# Patient Record
Sex: Male | Born: 1985 | Race: White | Hispanic: No | Marital: Single | Smoking: Current some day smoker
Health system: Southern US, Community
[De-identification: ages and names within clinical notes are randomized; demographics above are authoritative.]

## PROBLEM LIST (undated history)

## (undated) DIAGNOSIS — J45909 Unspecified asthma, uncomplicated: Secondary | ICD-10-CM

## (undated) DIAGNOSIS — J189 Pneumonia, unspecified organism: Secondary | ICD-10-CM

## (undated) DIAGNOSIS — T07XXXA Unspecified multiple injuries, initial encounter: Secondary | ICD-10-CM

## (undated) DIAGNOSIS — S42471A Displaced transcondylar fracture of right humerus, initial encounter for closed fracture: Secondary | ICD-10-CM

## (undated) DIAGNOSIS — S61411A Laceration without foreign body of right hand, initial encounter: Secondary | ICD-10-CM

## (undated) DIAGNOSIS — F329 Major depressive disorder, single episode, unspecified: Secondary | ICD-10-CM

## (undated) DIAGNOSIS — F32A Depression, unspecified: Secondary | ICD-10-CM

## (undated) DIAGNOSIS — K219 Gastro-esophageal reflux disease without esophagitis: Secondary | ICD-10-CM

## (undated) DIAGNOSIS — Z8489 Family history of other specified conditions: Secondary | ICD-10-CM

## (undated) HISTORY — PX: TYMPANOSTOMY TUBE PLACEMENT: SHX32

---
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
## 2007-02-06 ENCOUNTER — Emergency Department (HOSPITAL_COMMUNITY): Admission: EM | Admit: 2007-02-06 | Discharge: 2007-02-06 | Payer: Self-pay | Admitting: Emergency Medicine

## 2009-09-03 ENCOUNTER — Emergency Department (HOSPITAL_BASED_OUTPATIENT_CLINIC_OR_DEPARTMENT_OTHER): Admission: EM | Admit: 2009-09-03 | Discharge: 2009-09-03 | Payer: Self-pay | Admitting: Emergency Medicine

## 2011-01-26 LAB — COMPREHENSIVE METABOLIC PANEL
ALT: 20 U/L (ref 0–53)
AST: 22 U/L (ref 0–37)
Albumin: 4.9 g/dL (ref 3.5–5.2)
Alkaline Phosphatase: 57 U/L (ref 39–117)
BUN: 15 mg/dL (ref 6–23)
CO2: 25 mEq/L (ref 19–32)
Calcium: 9.9 mg/dL (ref 8.4–10.5)
Chloride: 108 mEq/L (ref 96–112)
Creatinine, Ser: 0.8 mg/dL (ref 0.4–1.5)
GFR calc Af Amer: >60 mL/min (ref >60)
GFR calc non Af Amer: >60 mL/min (ref >60)
Glucose, Bld: 79 mg/dL (ref 70–99)
Potassium: 4.2 mEq/L (ref 3.5–5.1)
Sodium: 144 mEq/L (ref 135–145)
Total Bilirubin: 0.8 mg/dL (ref 0.3–1.2)
Total Protein: 7.8 g/dL (ref 6.0–8.3)

## 2011-01-26 LAB — DIFFERENTIAL
Basophils Absolute: 0.1 10*3/uL (ref 0.0–0.1)
Basophils Relative: 2 % — ABNORMAL HIGH (ref 0–1)
Eosinophils Absolute: 0.4 10*3/uL (ref 0.0–0.7)
Eosinophils Relative: 7 % — ABNORMAL HIGH (ref 0–5)
Lymphocytes Relative: 33 % (ref 12–46)
Lymphs Abs: 2 10*3/uL (ref 0.7–4.0)
Monocytes Absolute: 0.7 10*3/uL (ref 0.1–1.0)
Monocytes Relative: 11 % (ref 3–12)
Neutro Abs: 3 10*3/uL (ref 1.7–7.7)
Neutrophils Relative %: 47 % (ref 43–77)

## 2011-01-26 LAB — CBC
HCT: 50.3 % (ref 39.0–52.0)
Hemoglobin: 17.1 g/dL — ABNORMAL HIGH (ref 13.0–17.0)
MCHC: 34.1 g/dL (ref 30.0–36.0)
MCV: 89.5 fL (ref 78.0–100.0)
Platelets: 233 10*3/uL (ref 150–400)
RBC: 5.62 MIL/uL (ref 4.22–5.81)
RDW: 12 % (ref 11.5–15.5)
WBC: 6.2 10*3/uL (ref 4.0–10.5)

## 2011-03-11 NOTE — Discharge Summary (Signed)
[place]. [facility]  Patient:    [name] Hall, [name] Hall                      MRN: [id number] Adm. Date:  [id number] Disc. Date: [id number] Attending:  Willow Ora Dictator:   Talmage Nap, M.D.                           Discharge Summary  ADMISSION DIAGNOSES: 1. Asthma exacerbation. 2. Smoking abuse. 3. Bipolar disorder. 4. Conduct disorder.  DISCHARGE DIAGNOSES: 1. Asthma exacerbation improved. 2. Tobacco cessation counseling. 3. Bipolar disorder. 4. Conduct disorder.  DISCHARGE MEDICATIONS: 1. Claritin 10 mg q.d. 2. Azithromycin 250 mg q.d. x 3 days. 3. Advair 250/50 one puff b.i.d. 4. Effexor 75 mg q.d. 5. Depakote ER 500 mg q.h.s. 6. Albuterol MDI with spacer or nebulizer every four hours as needed. 7. Prednisone taper 50 mg x 3 days, 20 mg x 3 days, 10 mg x 3 days, 5 mg    x 3 days, then stop.  FOLLOW-UP APPOINTMENTS:  Patients mom will call for an appointment at [facility] on Monday.  The phone number is [phone].  SPECIAL INSTRUCTIONS:  Patient was instructed not to smoke.  This was discussed with his family as well.  He should call the [facility] if he is having any difficulty breathing or shortness of breath.  HOSPITAL COURSE:  #1 - ASTHMA:  [name] Hall was originally admitted on September 23, 2000 at [facility] for asthma exacerbation.  He continued to do poorly and was transferred to [facility] for worsening asthma and possible need for intubation.  He was managed without intubation on high-dosed steroids, albuterol nebulizer, smoking cessation.  He did well and was transferred back to [facility] on September 29, 2000.  During his hospital stay at [facility], he required oxygen by nasal cannula x 24 hours.  He was continued on prednisone and changed to MDIs q.4-6h. as needed.  On hospital day #2, his oxygen had improved and he was saturating 96% on room air.  He continued to  do well and on the day of discharge was ambulating in the hallways with saturations of 95% on room air.  Patient denied any significant shortness of breath.  He continued to have a mild cough.  He will be discharged home on a prednisone taper, nebulizer or MDI treatments every four hours, and a full five-day course of azithromycin.  Patient will need follow up this week at [facility] and mom will call for an appointment.  #2 - BIPOLAR DISORDER AND CONDUCT DISORDER:  Patient apparently had some problems [facility] with anger management.  He has done well during this hospital stay.  He will be continued on his Depakote and Effexor.  #3 - SMOKING CESSATION:  Patient reports now that he had been smoking about one pack every three to four days and was receiving cigarettes from his family.  He does understand the importance of stopping smoking and has agreed to stop.  He does not feel that he needs assistance with stopping smoking. His passive tobacco exposure was also discussed with the family.  DISCHARGE DISPOSITION:  [name] Hall is doing well upon discharge and reported no significant shortness of breath even with exertion.  He had a mild cough.  He was afebrile.  He was taking a regular diet without any difficulty.  He will return home and will have follow up this week with family practice.  He will be placed on a slow prednisone taper the next 12 days as well as his albuterol.  We will continue Advair and consider Singulair if he continues to have problems. DD:  10/01/00 TD:  10/01/00 Job: [id number]

## 2011-03-11 NOTE — H&P (Signed)
[place]. [facility]  Patient:    [name] Hall, [name] Hall                      MRN: [id number] Adm. Date:  [id number] Attending:  Sanjuana Letters Dictator:   Talmage Nap, M.D.                         History and Physical  ADMITTING DIAGNOSES: 1. Asthma exacerbation.  Moderate. 2. Bipolar disorder.  HISTORY OF PRESENT ILLNESS:  Coleston is a 25 year old male with history of moderate persistent asthma with approximately ten prior admissions who presented to the emergency room via EMS secondary to respiratory distress. The patient and his mom reports a one day history of URI symptoms associated with rhinorrhea and cough and increased work of breathing.  The patient has received some relief with albuterol nebulizers but has worsened overnight. Physician spoke with mom last night approximately 7 p.m. and said that the patients peak flows were 175 with his best at 275 and was getting albuterol nebulizers q.2-4h.  Mom called this a.m. revealing that peak flow was approximately 75 and was instructed to bring Jamare to the emergency room emergently.  Dwayne denies any fevers.  He has not had any significant symptoms prior to today.  He denies any nausea or vomiting.  He has been unable to eat secondary to respiratory distress.  PAST MEDICAL HISTORY: 1. Asthma, admitted approximately 10 times in the past.  No intubations. 2. Bipolar disorder, admitted approximately two years ago, seen by Dr. Lafayette Dragon.  MEDICATIONS: 1. Advair 100-50, 2 puffs b.i.d. 2. Beconase. 3. Albuterol MDI approximately 2 times per month. 4. Effexor XR 75 mg a day. 5. Depakote ER 500 mg q.d.  ALLERGIES:  No known drug allergies.  SOCIAL HISTORY:  Akshat lives with his mom in [place], [place], and will begin seeing [facility].  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  Mom smokes at home.  FAMILY HISTORY:  Positive for asthma in dad.  Positive for bipolar disorder in mom and a cousin.  PAST  SURGICAL HISTORY:  Tympanostomy tubes.  REVIEW OF SYSTEMS:  Positive for rhinorrhea and cough.  No fever.  No current p.o. intake.  Positive respiratory distress.  No nausea, vomiting, or diarrhea.  No abdominal pain.  No chest pain.  Positive shortness of breath. Positive wheezing.  No urinary symptoms.  OBJECTIVE:  VITAL SIGNS:  Temperature is 99, blood pressure 143/78, heart rate 139, respiratory rate 28, O2 saturations 93% on room air.  Weight 125 pounds.  GENERAL:  A pleasant male who is unable to talk secondary to respiratory distress.  He is working hard to breathe using accessory muscles and tachypneic.  HEENT:  Pupils, equal, round, reactive to light.  Extraocular movements intact Conjunctivae mildly injected.  TMs are clear bilaterally.  Throat is pink without erythema or exudate.  Mucous membranes slightly dry.  NECK:  Supple without lymphadenopathy or thyromegaly.  CHEST:  Tight air movement bilaterally.  There are diffuse wheezes.  No rhonchi.  Wheezes are noted on inspiration and expiration.  There is decreased air movement.  No crackles.  CARDIOVASCULAR:  Tachycardic but regular.  There is slight increase in PMI .  EXTREMITIES:  Without clubbing, cyanosis, or edema.  ABDOMEN:  Soft with positive bowel sounds, nontender, nondistended.  NEUROLOGIC:  Alert male who is oriented.  He is having some trouble talking secondary to respiratory distress.  Exam nonfocal  A chest x-ray reveals hyperaeration with no focal infiltrates.  The remainder of labs pending.  ASSESSMENT AND PLAN: 1. Asthma exacerbation.  Josh with moderate respiratory distress and increased    work of breathing.  He was given Solu-Medrol 125 IV and is currently on a    continuous albuterol nebulizer.  We will continue albuterol nebulizer with    Atrovent x 3 for the next 3 hours.  Will give Solu-Medrol 125 q.8h. until    patient improved.  Will need to adjust the patients home medicines at     discharge.  Will monitor peak flows once the patient is improved.  Will    place on continuous pulse oxygen monitor and CV monitor on the pediatric    floor. 2. Bipolar disorder.  Will continue the patients Effexor and Depakote. DD:  09/25/00 TD:  09/25/00 Job: [id number]
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
## 2011-06-28 ENCOUNTER — Emergency Department (INDEPENDENT_AMBULATORY_CARE_PROVIDER_SITE_OTHER): Payer: Self-pay

## 2011-06-28 ENCOUNTER — Encounter: Payer: Self-pay | Admitting: *Deleted

## 2011-06-28 ENCOUNTER — Emergency Department (HOSPITAL_BASED_OUTPATIENT_CLINIC_OR_DEPARTMENT_OTHER)
Admission: EM | Admit: 2011-06-28 | Discharge: 2011-06-28 | Disposition: A | Discharge status: Home / Self Care | Payer: Self-pay | Attending: Emergency Medicine | Admitting: Emergency Medicine

## 2011-06-28 DIAGNOSIS — S93409A Sprain of unspecified ligament of unspecified ankle, initial encounter: Secondary | ICD-10-CM | POA: Insufficient documentation

## 2011-06-28 DIAGNOSIS — X500XXA Overexertion from strenuous movement or load, initial encounter: Secondary | ICD-10-CM | POA: Insufficient documentation

## 2011-06-28 DIAGNOSIS — S8990XA Unspecified injury of unspecified lower leg, initial encounter: Secondary | ICD-10-CM

## 2011-06-28 DIAGNOSIS — Y9372 Activity, wrestling: Secondary | ICD-10-CM | POA: Insufficient documentation

## 2011-06-28 MED ORDER — IBUPROFEN 800 MG PO TABS: 800.0000 mg | ORAL_TABLET | Freq: Three times a day (TID) | ORAL | Status: AC

## 2011-06-28 NOTE — ED Notes (Signed)
Right ankle injured 10 days ago still having some pain states wants to make sure it isnt broken

## 2011-06-28 NOTE — ED Notes (Signed)
Right ankle inj 10 days ago. Wearing an ASO splint on arrival that he borrowed from a friend.

## 2011-06-28 NOTE — ED Provider Notes (Signed)
History     CSN: [id number] Arrival date & time: 06/28/2011  1:39 PM  Chief Complaint  Patient presents with  . Ankle Pain   HPI Comments: R ankle pain x 10 days after twisting while wrestling with a friend.  Not taking anything for pain but thought it'd be better by now.  Able to bear weight, using friend's ASO brace.  No weakness, numbness, tingling.  Pain in anterior medial ankle.    The history is provided by the patient.    Past Medical History  Diagnosis Date  . Asthma     History reviewed. No pertinent past surgical history.  No family history on file.  History  Substance Use Topics  . Smoking status: Current Everyday Smoker -- 0.5 packs/day  . Smokeless tobacco: Not on file  . Alcohol Use: Yes      Review of Systems  Constitutional: Negative for fever, activity change and appetite change.  HENT: Negative.   Respiratory: Negative for cough, chest tightness and shortness of breath.   Cardiovascular: Negative.   Gastrointestinal: Negative for nausea, vomiting and abdominal pain.  Genitourinary: Negative for dysuria.  Musculoskeletal: Positive for joint swelling and arthralgias. Negative for back pain.  Neurological: Negative for headaches.  Psychiatric/Behavioral: Negative.     Physical Exam  BP 112/72  Pulse 72  Temp(Src) 98.2 F (36.8 C) (Oral)  Resp 20  SpO2 99%  Physical Exam  Constitutional: He is oriented to person, place, and time. He appears well-developed and well-nourished. No distress.  HENT:  Head: Normocephalic and atraumatic.  Mouth/Throat: Oropharynx is clear and moist. No oropharyngeal exudate.  Eyes: Conjunctivae are normal. Pupils are equal, round, and reactive to light.  Neck: Normal range of motion.  Cardiovascular: Normal rate, regular rhythm and normal heart sounds.   Pulmonary/Chest: Effort normal and breath sounds normal. No respiratory distress.  Abdominal: Soft. There is no tenderness. There is no rebound and no guarding.    Musculoskeletal: He exhibits tenderness.       TTP R medial malleolus and talus.  +2 DP and PT pulses, ROM of ankle and toes intact.  Neurological: He is alert and oriented to person, place, and time. No cranial nerve deficit.  Skin: Skin is warm.    ED Course  Procedures  MDM Ankle sprain, able to bear weight. Air cast, pain control, RICE  Dg Ankle Complete Right  06/28/2011  *RADIOLOGY REPORT*  Clinical Data: Twisting injury 10 days ago.  RIGHT ANKLE - COMPLETE 3+ VIEW  Comparison: None.  Findings: Soft tissue prominence malleolar region without underlying fracture or dislocation.  IMPRESSION: No fracture or dislocation.  Soft tissue prominence malleolar region.  Original Report Authenticated By: Fuller Canada, M.D.          Glynn Octave, MD 06/28/11 [id number]

## 2011-09-12 ENCOUNTER — Encounter (HOSPITAL_COMMUNITY): Payer: Self-pay

## 2011-09-12 ENCOUNTER — Emergency Department (HOSPITAL_COMMUNITY)
Admission: EM | Admit: 2011-09-12 | Discharge: 2011-09-12 | Disposition: A | Discharge status: Home / Self Care | Payer: Self-pay | Attending: Emergency Medicine | Admitting: Emergency Medicine

## 2011-09-12 DIAGNOSIS — R0789 Other chest pain: Secondary | ICD-10-CM | POA: Insufficient documentation

## 2011-09-12 DIAGNOSIS — J45901 Unspecified asthma with (acute) exacerbation: Secondary | ICD-10-CM | POA: Insufficient documentation

## 2011-09-12 DIAGNOSIS — J3489 Other specified disorders of nose and nasal sinuses: Secondary | ICD-10-CM | POA: Insufficient documentation

## 2011-09-12 DIAGNOSIS — R059 Cough, unspecified: Secondary | ICD-10-CM | POA: Insufficient documentation

## 2011-09-12 DIAGNOSIS — R0602 Shortness of breath: Secondary | ICD-10-CM | POA: Insufficient documentation

## 2011-09-12 DIAGNOSIS — R070 Pain in throat: Secondary | ICD-10-CM | POA: Insufficient documentation

## 2011-09-12 DIAGNOSIS — F17200 Nicotine dependence, unspecified, uncomplicated: Secondary | ICD-10-CM | POA: Insufficient documentation

## 2011-09-12 DIAGNOSIS — R05 Cough: Secondary | ICD-10-CM | POA: Insufficient documentation

## 2011-09-12 MED ORDER — IPRATROPIUM BROMIDE 0.02 % IN SOLN
0.5000 mg | Freq: Once | RESPIRATORY_TRACT | Status: AC
  Administered 2011-09-12: 0.5 mg via RESPIRATORY_TRACT
  Filled 2011-09-12: qty 2.5

## 2011-09-12 MED ORDER — PREDNISONE 10 MG PO TABS: 20.0000 mg | ORAL_TABLET | Freq: Every day | ORAL | Status: DC

## 2011-09-12 MED ORDER — ALBUTEROL SULFATE HFA 108 (90 BASE) MCG/ACT IN AERS: 2.0000 | INHALATION_SPRAY | RESPIRATORY_TRACT | Status: DC | PRN

## 2011-09-12 MED ORDER — ALBUTEROL SULFATE (5 MG/ML) 0.5% IN NEBU
2.5000 mg | INHALATION_SOLUTION | Freq: Once | RESPIRATORY_TRACT | Status: AC
  Administered 2011-09-12: 2.5 mg via RESPIRATORY_TRACT
  Filled 2011-09-12: qty 0.5

## 2011-09-12 MED ORDER — ALBUTEROL SULFATE HFA 108 (90 BASE) MCG/ACT IN AERS
2.0000 | INHALATION_SPRAY | Freq: Once | RESPIRATORY_TRACT | Status: AC
  Administered 2011-09-12: 2 via RESPIRATORY_TRACT
  Filled 2011-09-12: qty 6.7

## 2011-09-12 MED ORDER — PREDNISONE 20 MG PO TABS
60.0000 mg | ORAL_TABLET | Freq: Once | ORAL | Status: AC
  Administered 2011-09-12: 60 mg via ORAL
  Filled 2011-09-12: qty 3

## 2011-09-12 NOTE — ED Provider Notes (Signed)
History     CSN: [id number] Arrival date & time: 09/12/2011 11:36 AM   First MD Initiated Contact with Patient 09/12/11 1150      Chief Complaint  Patient presents with  . URI    hx of asthma    (Consider location/radiation/quality/duration/timing/severity/associated sxs/prior treatment) HPI Comments: Patient here with complaints of wheezing and shortness of breath since yesterday - he states that he is out of his inhaler - reports rhinorrhea like allergy type symptoms for the past several days as well - states that he is a smoker.  Denies productive cough, fever or chills.  Patient is a 25 y.o. male presenting with wheezing. The history is provided by the patient. No language interpreter was used.  Wheezing  The current episode started yesterday. The onset was gradual. The problem occurs frequently. The problem has been unchanged. The problem is moderate. The symptoms are relieved by nothing. The symptoms are aggravated by nothing. Associated symptoms include chest pressure, rhinorrhea, sore throat, cough, shortness of breath and wheezing. Pertinent negatives include no chest pain and no fever. There was no intake of a foreign body. He was not exposed to toxic fumes. He has not inhaled smoke recently. He has had no prior steroid use. He has had no prior hospitalizations. He has had no prior ICU admissions. He has had no prior intubations. His past medical history is significant for asthma. He has been behaving normally. Urine output has been normal. The last void occurred less than 6 hours ago. There were no sick contacts. He has received no recent medical care.    Past Medical History  Diagnosis Date  . Asthma     History reviewed. No pertinent past surgical history.  History reviewed. No pertinent family history.  History  Substance Use Topics  . Smoking status: Current Everyday Smoker -- 0.5 packs/day  . Smokeless tobacco: Not on file  . Alcohol Use: Yes      Review of  Systems  Constitutional: Negative for fever.  HENT: Positive for sore throat and rhinorrhea.   Respiratory: Positive for cough, shortness of breath and wheezing.   Cardiovascular: Negative for chest pain.  All other systems reviewed and are negative.    Allergies  Uncoded nonscreenable allergen  Home Medications   Current Outpatient Rx  Name Route Sig Dispense Refill  . ALBUTEROL SULFATE HFA 108 (90 BASE) MCG/ACT IN AERS Inhalation Inhale 2 puffs into the lungs every 6 (six) hours as needed. For shortness of breath       BP 103/68  Pulse 78  Temp(Src) 96.8 F (36 C) (Oral)  Resp 16  Ht 5\' 11"  (1.803 m)  Wt 165 lb (74.844 kg)  BMI 23.01 kg/m2  SpO2 96%  Physical Exam  Nursing note and vitals reviewed. Constitutional: He is oriented to person, place, and time. He appears well-developed and well-nourished.  HENT:  Head: Normocephalic and atraumatic.  Right Ear: External ear normal.  Left Ear: External ear normal.  Nose: Rhinorrhea present.  Mouth/Throat: Oropharynx is clear and moist.  Eyes: Pupils are equal, round, and reactive to light.  Neck: Normal range of motion. Neck supple.  Cardiovascular: Normal rate, regular rhythm, normal heart sounds and intact distal pulses.  Exam reveals no gallop and no friction rub.   No murmur heard. Pulmonary/Chest: Effort normal. No respiratory distress. He has wheezes in the right upper field, the right middle field, the right lower field, the left upper field and the left lower field.  Abdominal: Soft. Bowel  sounds are normal. There is no tenderness.  Musculoskeletal: Normal range of motion.  Neurological: He is alert and oriented to person, place, and time. No cranial nerve deficit.  Skin: Skin is warm and dry.  Psychiatric: He has a normal mood and affect. His behavior is normal. Judgment and thought content normal.    ED Course  Procedures (including critical care time)  Labs Reviewed - No data to display No results  found.   Asthma attack   MDM  Continues with expiratory wheezing in right upper and middle lobe after albuterol and atrovent and oral steroids - will re-treat with another treatment.      Marked improvement after second treatment - no wheezing noted at this time.  Izola Price [place], [place] 09/12/11 1515

## 2011-09-12 NOTE — ED Provider Notes (Signed)
Medical screening examination/treatment/procedure(s) were conducted as a shared visit with non-physician practitioner(s) and myself.  I personally evaluated the patient during the encounter  Doug Sou, MD 09/12/11 [phone]

## 2011-09-12 NOTE — ED Provider Notes (Signed)
Complains of wheezing and cough onset last night feels much improved since treatment in the emergency department.At 2:30 p.m. patient speaks in paragraphs no respiratory distress lungs with minimal end expiratory wheezes  Doug Sou, MD 09/12/11 1439

## 2011-09-12 NOTE — ED Notes (Signed)
Pt. Developed.  Cough and wheezing last night

## 2011-09-23 ENCOUNTER — Encounter (HOSPITAL_COMMUNITY): Payer: Self-pay | Admitting: Emergency Medicine

## 2011-09-23 ENCOUNTER — Observation Stay (HOSPITAL_COMMUNITY)
Admission: EM | Admit: 2011-09-23 | Discharge: 2011-09-23 | Disposition: A | Discharge status: Home / Self Care | Payer: Self-pay | Attending: Emergency Medicine | Admitting: Emergency Medicine

## 2011-09-23 DIAGNOSIS — J45901 Unspecified asthma with (acute) exacerbation: Principal | ICD-10-CM | POA: Insufficient documentation

## 2011-09-23 MED ORDER — ALBUTEROL SULFATE (5 MG/ML) 0.5% IN NEBU
INHALATION_SOLUTION | RESPIRATORY_TRACT | Status: AC
  Administered 2011-09-23: 5 mg via RESPIRATORY_TRACT
  Filled 2011-09-23: qty 1

## 2011-09-23 MED ORDER — ALBUTEROL SULFATE HFA 108 (90 BASE) MCG/ACT IN AERS
2.0000 | INHALATION_SPRAY | RESPIRATORY_TRACT | Status: DC | PRN
  Administered 2011-09-23: 2 via RESPIRATORY_TRACT
  Filled 2011-09-23: qty 6.7

## 2011-09-23 MED ORDER — ALBUTEROL SULFATE (2.5 MG/3ML) 0.083% IN NEBU: 2.5000 mg | INHALATION_SOLUTION | Freq: Four times a day (QID) | RESPIRATORY_TRACT | Status: DC | PRN

## 2011-09-23 MED ORDER — IPRATROPIUM BROMIDE 0.02 % IN SOLN
0.5000 mg | Freq: Once | RESPIRATORY_TRACT | Status: AC
  Administered 2011-09-23: 0.5 mg via RESPIRATORY_TRACT
  Filled 2011-09-23: qty 2.5

## 2011-09-23 MED ORDER — ALBUTEROL SULFATE (5 MG/ML) 0.5% IN NEBU
2.5000 mg | INHALATION_SOLUTION | Freq: Once | RESPIRATORY_TRACT | Status: AC
  Administered 2011-09-23: 2.5 mg via RESPIRATORY_TRACT
  Filled 2011-09-23: qty 0.5

## 2011-09-23 MED ORDER — PREDNISONE 20 MG PO TABS
60.0000 mg | ORAL_TABLET | Freq: Once | ORAL | Status: AC
  Administered 2011-09-23: 60 mg via ORAL
  Filled 2011-09-23: qty 3

## 2011-09-23 MED ORDER — IPRATROPIUM BROMIDE 0.02 % IN SOLN
RESPIRATORY_TRACT | Status: AC
  Administered 2011-09-23: 08:00:00 via RESPIRATORY_TRACT
  Filled 2011-09-23: qty 2.5

## 2011-09-23 NOTE — ED Provider Notes (Signed)
10:50 AM Reassessed patient.  No acute distress at this time.  No labored breathing.  O2 sat currently 95 on RA.  Patient reports that he is feeling less short of breath at this time.  Wheezing improved.  Diffuse mild expiratory wheezing heard on exam.  Will continue to monitor patient and continue on bronchospasm protocol. 11:34 AM Reassessed patient.  No acute distress.  No labored breathing.  O2 sat currently 97 on RA.  Patient reports that he is feeling much better.  Mild expiratory wheezing heard on the Left upper lobe anteriorly.  Peak flow improved.  Pascal Lux [facility] 09/25/11 2351

## 2011-09-23 NOTE — ED Notes (Signed)
LACY WITH RESPIRATORY AWARE OF CHANGE IN ORDER TO INHALER

## 2011-09-23 NOTE — Progress Notes (Signed)
Instructed patient on how to do a peak flow.  1st measurement 250, 2nd measurement 210, 3rd measurement 200.  Started patient on treatment.  Will do peak flow again in one hour.

## 2011-09-23 NOTE — ED Provider Notes (Signed)
History     CSN: [id number] Arrival date & time: 09/23/2011  7:58 AM   First MD Initiated Contact with Patient 09/23/11 [phone]      Chief Complaint  Patient presents with  . Asthma    (Consider location/radiation/quality/duration/timing/severity/associated sxs/prior treatment) HPI Comments: Patient reports that he began feeling short of breath and having tightness in his chest at approximately 3AM this morning.  PMH significant for asthma.  He reports that he usually uses a rescue albuterol inhaler.  However, he is out of his albuterol so did not use it this morning.  He currently does not smoke, but quit approximately 2 weeks ago.  He has been hospitalized in the past for his asthma, but not in several years.  No prior intubations.  Patient is a 25 y.o. male presenting with asthma. The history is provided by the patient.  Asthma This is a recurrent problem. The current episode started today. The problem occurs constantly. The problem has been gradually worsening. Associated symptoms include chest pain and coughing. Pertinent negatives include no abdominal pain, chills, congestion, diaphoresis, fever, headaches, nausea, neck pain, numbness, rash, vomiting or weakness. The symptoms are aggravated by nothing. He has tried nothing for the symptoms.    Past Medical History  Diagnosis Date  . Asthma     History reviewed. No pertinent past surgical history.  History reviewed. No pertinent family history.  History  Substance Use Topics  . Smoking status: Former Smoker -- 0.5 packs/day  . Smokeless tobacco: Not on file  . Alcohol Use: Yes      Review of Systems  Constitutional: Negative for fever, chills and diaphoresis.  HENT: Negative for congestion, rhinorrhea, neck pain and neck stiffness.   Respiratory: Positive for cough, chest tightness and wheezing. Negative for choking and stridor.   Cardiovascular: Positive for chest pain.  Gastrointestinal: Negative for nausea, vomiting,  abdominal pain and abdominal distention.  Skin: Negative for color change, pallor and rash.  Neurological: Negative for dizziness, syncope, weakness, light-headedness, numbness and headaches.    Allergies  Uncoded nonscreenable allergen  Home Medications   Current Outpatient Rx  Name Route Sig Dispense Refill  . ALBUTEROL SULFATE HFA 108 (90 BASE) MCG/ACT IN AERS Inhalation Inhale 2 puffs into the lungs every 6 (six) hours as needed. For shortness of breath      BP 119/83  Pulse 95  Temp(Src) 97.6 F (36.4 C) (Oral)  Resp 24  SpO2 96%  Physical Exam  Constitutional: He is oriented to person, place, and time. He appears well-developed and well-nourished. No distress.  HENT:  Head: Normocephalic and atraumatic.  Right Ear: External ear normal.  Left Ear: External ear normal.  Mouth/Throat: Oropharynx is clear and moist.  Neck: Normal range of motion. Neck supple.  Cardiovascular: Normal rate, regular rhythm and normal heart sounds.   Pulmonary/Chest: Effort normal. No accessory muscle usage. No respiratory distress. He has wheezes in the right upper field, the right middle field, the right lower field, the left upper field, the left middle field and the left lower field. He has no rhonchi. He has no rales. He exhibits no tenderness.  Abdominal: Soft. There is no tenderness.  Lymphadenopathy:    He has no cervical adenopathy.  Neurological: He is alert and oriented to person, place, and time.  Skin: Skin is warm and dry. No rash noted. He is not diaphoretic.  Psychiatric: He has a normal mood and affect.    ED Course  Procedures (including critical care time)  Labs Reviewed - No data to display No results found.   1. Asthma exacerbation    Patient given 60mg  prednisone and treated with neb albuterol/atrovent while in the Emergency Department.  Patient also put in the CDU on bronchospasm protocol and evaluated by respiratory therapy.  Patient's symptoms improved  significantly after treatment.  Therefore, patient discharged home and given an albuterol HFA while in the ED.   MDM  Patient's symptoms improved with albuterol/atrovent breathing treatment.        Pascal Lux [facility] 09/25/11 2355

## 2011-09-23 NOTE — ED Notes (Signed)
Pt waiting on rx for a home nebulizer

## 2011-09-23 NOTE — ED Notes (Signed)
Pt sts increased SOB and asthma sx since running out of his inhalor yesterday; pt sts does have rx but unable to afford getting filled; pt tachypnic and labored upon arrival; pt with decreased air movement and wheezing noted

## 2011-09-23 NOTE — Progress Notes (Signed)
Patient peak flow 1st attempt 280, 2nd attempt 270, 3rd attempt 260.  RT will continue to monitor.

## 2011-09-27 NOTE — ED Provider Notes (Signed)
Evaluation and management procedures were performed by the PA/NP under my supervision/collaboration.   Dione Booze, MD 09/27/11 [phone]

## 2011-09-27 NOTE — ED Provider Notes (Signed)
Evaluation and management procedures were performed by the PA/NP under my supervision/collaboration.   Yaneth Fairbairn, MD 09/27/11 0127 

## 2011-09-30 NOTE — Progress Notes (Signed)
Observation review completed. 

## 2011-10-28 ENCOUNTER — Encounter (HOSPITAL_COMMUNITY): Payer: Self-pay | Admitting: Emergency Medicine

## 2011-10-28 ENCOUNTER — Emergency Department (INDEPENDENT_AMBULATORY_CARE_PROVIDER_SITE_OTHER): Payer: Self-pay

## 2011-10-28 ENCOUNTER — Emergency Department (HOSPITAL_BASED_OUTPATIENT_CLINIC_OR_DEPARTMENT_OTHER)
Admission: EM | Admit: 2011-10-28 | Discharge: 2011-10-28 | Disposition: A | Discharge status: Home / Self Care | Payer: Self-pay | Attending: Emergency Medicine | Admitting: Emergency Medicine

## 2011-10-28 ENCOUNTER — Encounter (HOSPITAL_BASED_OUTPATIENT_CLINIC_OR_DEPARTMENT_OTHER): Payer: Self-pay | Admitting: *Deleted

## 2011-10-28 ENCOUNTER — Inpatient Hospital Stay (HOSPITAL_COMMUNITY)
Admission: EM | Admit: 2011-10-28 | Discharge: 2011-11-04 | DRG: 202 | Payer: Self-pay | Attending: Internal Medicine | Admitting: Internal Medicine

## 2011-10-28 ENCOUNTER — Emergency Department (HOSPITAL_COMMUNITY): Payer: Self-pay

## 2011-10-28 DIAGNOSIS — F1210 Cannabis abuse, uncomplicated: Secondary | ICD-10-CM | POA: Diagnosis present

## 2011-10-28 DIAGNOSIS — R079 Chest pain, unspecified: Secondary | ICD-10-CM | POA: Insufficient documentation

## 2011-10-28 DIAGNOSIS — J9600 Acute respiratory failure, unspecified whether with hypoxia or hypercapnia: Secondary | ICD-10-CM | POA: Diagnosis present

## 2011-10-28 DIAGNOSIS — R059 Cough, unspecified: Secondary | ICD-10-CM | POA: Insufficient documentation

## 2011-10-28 DIAGNOSIS — R45851 Suicidal ideations: Secondary | ICD-10-CM

## 2011-10-28 DIAGNOSIS — F29 Unspecified psychosis not due to a substance or known physiological condition: Secondary | ICD-10-CM

## 2011-10-28 DIAGNOSIS — R45850 Homicidal ideations: Secondary | ICD-10-CM

## 2011-10-28 DIAGNOSIS — R05 Cough: Secondary | ICD-10-CM

## 2011-10-28 DIAGNOSIS — J209 Acute bronchitis, unspecified: Secondary | ICD-10-CM | POA: Diagnosis present

## 2011-10-28 DIAGNOSIS — F3189 Other bipolar disorder: Secondary | ICD-10-CM | POA: Diagnosis present

## 2011-10-28 DIAGNOSIS — R52 Pain, unspecified: Secondary | ICD-10-CM

## 2011-10-28 DIAGNOSIS — R451 Restlessness and agitation: Secondary | ICD-10-CM

## 2011-10-28 DIAGNOSIS — R062 Wheezing: Secondary | ICD-10-CM

## 2011-10-28 DIAGNOSIS — R0989 Other specified symptoms and signs involving the circulatory and respiratory systems: Secondary | ICD-10-CM

## 2011-10-28 DIAGNOSIS — J3489 Other specified disorders of nose and nasal sinuses: Secondary | ICD-10-CM | POA: Diagnosis present

## 2011-10-28 DIAGNOSIS — J45901 Unspecified asthma with (acute) exacerbation: Principal | ICD-10-CM

## 2011-10-28 DIAGNOSIS — F17200 Nicotine dependence, unspecified, uncomplicated: Secondary | ICD-10-CM | POA: Diagnosis present

## 2011-10-28 DIAGNOSIS — F419 Anxiety disorder, unspecified: Secondary | ICD-10-CM

## 2011-10-28 DIAGNOSIS — R0602 Shortness of breath: Secondary | ICD-10-CM | POA: Insufficient documentation

## 2011-10-28 DIAGNOSIS — J45909 Unspecified asthma, uncomplicated: Secondary | ICD-10-CM | POA: Insufficient documentation

## 2011-10-28 LAB — DIFFERENTIAL
Basophils Relative: 0 % (ref 0–1)
Eosinophils Absolute: 0 10*3/uL (ref 0.0–0.7)
Eosinophils Relative: 0 % (ref 0–5)
Monocytes Absolute: 0.2 10*3/uL (ref 0.1–1.0)
Monocytes Relative: 1 % — ABNORMAL LOW (ref 3–12)
Neutro Abs: 13.6 10*3/uL — ABNORMAL HIGH (ref 1.7–7.7)

## 2011-10-28 LAB — CBC
HCT: 45.5 % (ref 39.0–52.0)
Hemoglobin: 16.1 g/dL (ref 13.0–17.0)
MCH: 30 pg (ref 26.0–34.0)
MCHC: 35.4 g/dL (ref 30.0–36.0)
MCV: 84.7 fL (ref 78.0–100.0)

## 2011-10-28 LAB — BASIC METABOLIC PANEL
BUN: 12 mg/dL (ref 6–23)
Chloride: 105 mEq/L (ref 96–112)
Creatinine, Ser: 0.68 mg/dL (ref 0.50–1.35)
GFR calc Af Amer: >90 mL/min (ref >90)

## 2011-10-28 MED ORDER — ALBUTEROL SULFATE (5 MG/ML) 0.5% IN NEBU
INHALATION_SOLUTION | RESPIRATORY_TRACT | Status: AC
  Filled 2011-10-28: qty 3

## 2011-10-28 MED ORDER — METHYLPREDNISOLONE SODIUM SUCC 125 MG IJ SOLR
125.0000 mg | Freq: Once | INTRAMUSCULAR | Status: AC
  Administered 2011-10-28: 125 mg via INTRAVENOUS
  Filled 2011-10-28: qty 2

## 2011-10-28 MED ORDER — METHYLPREDNISOLONE SODIUM SUCC 125 MG IJ SOLR
INTRAMUSCULAR | Status: AC
  Administered 2011-10-28: 125 mg via INTRAVENOUS
  Filled 2011-10-28: qty 2

## 2011-10-28 MED ORDER — ALBUTEROL (5 MG/ML) CONTINUOUS INHALATION SOLN
10.0000 mg/h | INHALATION_SOLUTION | Freq: Once | RESPIRATORY_TRACT | Status: DC
  Filled 2011-10-28: qty 20

## 2011-10-28 MED ORDER — IPRATROPIUM BROMIDE 0.02 % IN SOLN
0.5000 mg | Freq: Once | RESPIRATORY_TRACT | Status: AC
  Administered 2011-10-28: 0.5 mg via RESPIRATORY_TRACT
  Filled 2011-10-28: qty 2.5

## 2011-10-28 MED ORDER — ALBUTEROL SULFATE HFA 108 (90 BASE) MCG/ACT IN AERS
2.0000 | INHALATION_SPRAY | Freq: Once | RESPIRATORY_TRACT | Status: AC
  Administered 2011-10-28: 2 via RESPIRATORY_TRACT

## 2011-10-28 MED ORDER — ALBUTEROL SULFATE (5 MG/ML) 0.5% IN NEBU
5.0000 mg | INHALATION_SOLUTION | Freq: Once | RESPIRATORY_TRACT | Status: AC
  Administered 2011-10-28: 5 mg via RESPIRATORY_TRACT
  Filled 2011-10-28: qty 0.5

## 2011-10-28 MED ORDER — MAGNESIUM SULFATE 50 % IJ SOLN: 2.0000 g | Freq: Once | INTRAMUSCULAR | Status: DC

## 2011-10-28 MED ORDER — ALBUTEROL SULFATE (2.5 MG/3ML) 0.083% IN NEBU: 2.5000 mg | INHALATION_SOLUTION | Freq: Four times a day (QID) | RESPIRATORY_TRACT | Status: DC | PRN

## 2011-10-28 MED ORDER — ALBUTEROL (5 MG/ML) CONTINUOUS INHALATION SOLN
5.0000 mg/h | INHALATION_SOLUTION | Freq: Once | RESPIRATORY_TRACT | Status: DC
  Administered 2011-10-28: 10 mg via RESPIRATORY_TRACT
  Filled 2011-10-28: qty 20

## 2011-10-28 MED ORDER — ALBUTEROL (5 MG/ML) CONTINUOUS INHALATION SOLN: 10.0000 mg/h | INHALATION_SOLUTION | RESPIRATORY_TRACT | Status: DC

## 2011-10-28 MED ORDER — IPRATROPIUM BROMIDE 0.02 % IN SOLN
RESPIRATORY_TRACT | Status: AC
  Filled 2011-10-28: qty 2.5

## 2011-10-28 MED ORDER — ALBUTEROL SULFATE (5 MG/ML) 0.5% IN NEBU
INHALATION_SOLUTION | RESPIRATORY_TRACT | Status: AC
  Administered 2011-10-28: 10 mg via RESPIRATORY_TRACT
  Filled 2011-10-28: qty 0.5

## 2011-10-28 MED ORDER — ALBUTEROL SULFATE (5 MG/ML) 0.5% IN NEBU
5.0000 mg | INHALATION_SOLUTION | Freq: Once | RESPIRATORY_TRACT | Status: AC
  Administered 2011-10-28: 5 mg via RESPIRATORY_TRACT
  Filled 2011-10-28: qty 1

## 2011-10-28 MED ORDER — ALBUTEROL SULFATE (5 MG/ML) 0.5% IN NEBU
INHALATION_SOLUTION | RESPIRATORY_TRACT | Status: AC
  Filled 2011-10-28: qty 1

## 2011-10-28 MED ORDER — IPRATROPIUM BROMIDE 0.02 % IN SOLN
0.5000 mg | Freq: Once | RESPIRATORY_TRACT | Status: AC
  Administered 2011-10-28: 0.5 mg via RESPIRATORY_TRACT

## 2011-10-28 MED ORDER — LORAZEPAM 2 MG/ML IJ SOLN
1.0000 mg | Freq: Once | INTRAMUSCULAR | Status: AC
  Administered 2011-10-28: 1 mg via INTRAVENOUS

## 2011-10-28 MED ORDER — MAGNESIUM SULFATE 40 MG/ML IJ SOLN
2.0000 g | INTRAMUSCULAR | Status: AC
  Administered 2011-10-28: 2 g via INTRAVENOUS
  Filled 2011-10-28: qty 50

## 2011-10-28 MED ORDER — LORAZEPAM 2 MG/ML IJ SOLN
INTRAMUSCULAR | Status: AC
  Filled 2011-10-28: qty 1

## 2011-10-28 MED ORDER — PREDNISONE 10 MG PO TABS: ORAL_TABLET | ORAL | Status: DC

## 2011-10-28 MED ORDER — ALBUTEROL SULFATE HFA 108 (90 BASE) MCG/ACT IN AERS
INHALATION_SPRAY | RESPIRATORY_TRACT | Status: AC
  Administered 2011-10-28: 2 via RESPIRATORY_TRACT
  Filled 2011-10-28: qty 6.7

## 2011-10-28 MED ORDER — ALBUTEROL SULFATE (5 MG/ML) 0.5% IN NEBU
5.0000 mg | INHALATION_SOLUTION | Freq: Once | RESPIRATORY_TRACT | Status: AC
  Administered 2011-10-28: 5 mg via RESPIRATORY_TRACT

## 2011-10-28 NOTE — ED Notes (Signed)
Pt amb to room 8 with quick steady gait in nad. Reports cough and congestion x 3 days, refuses to wear mask.

## 2011-10-28 NOTE — ED Notes (Signed)
Pt not improving after treatments are given. Pt brought by EMS from home, pt was seen about [facility] at [facility], states that shortly after arriving home, pt experienced another Asthma attack, states the worse he had in a long time, insperitory and expiratory wheezing in all lobes, in route given Albuterol 15mg   And Atrovent 0.5mg , 125mg  SoluMedrol. Earlier today pt also was treated with breathing treatment and steroids. Pt given Rx as well. Marland Kitchen

## 2011-10-28 NOTE — ED Notes (Signed)
QMV:[id number]<NG> Expected date:10/28/11<BR> Expected time: 9:46 PM<BR> Means of arrival:Ambulance<BR> Comments:<BR> Respiratory distress/Asthma attack/ Audible Wheezing bilaterally

## 2011-10-28 NOTE — Progress Notes (Signed)
2225-RT attempted BIPAP 10/5 and 30% one Pt. Pt did not tolerate at all. Pt became very anxious and stated that it was hard to exhale.  RN notified

## 2011-10-28 NOTE — ED Notes (Addendum)
Pt [facility] with c/o of asthma attack. Pt had breathing treatment in progress upon arrival. Pt now on NRB 15 lpm. Pt is unable to speak in complete sentences. Pt has a tripod position sitting on the edge of the bed. Pt has accessory muscle use, tachypnea, sub-sternal retractions. MD Adriana Simas has been notified of pt status. Pt received 125 mg of solu-medrol en route. Pt states he thinks he has had close to twenty breathing treatments from his own, med[facility], and EMS. Will continue to monitor pt's status

## 2011-10-28 NOTE — ED Notes (Addendum)
Pt brought by EMS from home, pt was seen about 1300 at [facility], states that shortly after arriving home, pt experienced another Asthma attack, states the worse he had in a long time, insperitory and expiratory wheezing in all lobes, in route given Albuterol 15mg   And Atrovent 0.5mg , 125mg  SoluMedrol. Earlier today pt also was treated with breathing treatment and prednisone (steroids). Pt given Rx as well. Pt not improving after treatments are given.

## 2011-10-28 NOTE — Progress Notes (Signed)
2325-RT attempted BIPAP again per Pt request.  Pt did not tolerated very well, pt states that it makes his chest hurt and requested to be taken off.  Pt placed back on NRB.  RN present at bedside.  RT to monitor and assess as needed

## 2011-10-28 NOTE — ED Notes (Signed)
Pt given 1 10mg  CAT.  CAT finished.  Pt states he feels some better however when he stands up he feels dizzy.

## 2011-10-28 NOTE — ED Notes (Signed)
MD at bedside. 

## 2011-10-28 NOTE — ED Provider Notes (Signed)
History     CSN: [id number]  Arrival date & time 10/28/11  1231   First MD Initiated Contact with Patient 10/28/11 1309      Chief Complaint  Patient presents with  . Cough  . Nasal Congestion    (Consider location/radiation/quality/duration/timing/severity/associated sxs/prior treatment) Patient is a 26 y.o. male presenting with cough. The history is provided by the patient. No language interpreter was used.  Cough This is a new problem. The current episode started more than 2 days ago. The problem occurs constantly. The problem has been gradually worsening. The cough is productive of sputum. There has been no fever. Associated symptoms include chest pain, shortness of breath and wheezing. He has tried nothing for the symptoms. He is a smoker.  Pt has a history of asthma.   Pt is on albuterol nebs at home.    Past Medical History  Diagnosis Date  . Asthma     History reviewed. No pertinent past surgical history.  No family history on file.  History  Substance Use Topics  . Smoking status: Former Smoker -- 0.5 packs/day  . Smokeless tobacco: Not on file  . Alcohol Use: Yes      Review of Systems  Respiratory: Positive for cough, shortness of breath and wheezing.   Cardiovascular: Positive for chest pain.  All other systems reviewed and are negative.    Allergies  Uncoded nonscreenable allergen  Home Medications   Current Outpatient Rx  Name Route Sig Dispense Refill  . ALBUTEROL SULFATE HFA 108 (90 BASE) MCG/ACT IN AERS Inhalation Inhale 2 puffs into the lungs every 6 (six) hours as needed. For shortness of breath    . ALBUTEROL SULFATE (2.5 MG/3ML) 0.083% IN NEBU Nebulization Take 3 mLs (2.5 mg total) by nebulization every 6 (six) hours as needed for wheezing. 75 mL 12    BP 114/83  Pulse 88  Temp(Src) 97.4 F (36.3 C) (Oral)  Resp 18  SpO2 89%  Physical Exam  Nursing note and vitals reviewed. Constitutional: He is oriented to person, place, and time.  He appears well-developed and well-nourished.  HENT:  Head: Normocephalic and atraumatic.  Right Ear: External ear normal.  Left Ear: External ear normal.  Nose: Nose normal.  Mouth/Throat: Oropharynx is clear and moist.  Eyes: Conjunctivae and EOM are normal. Pupils are equal, round, and reactive to light.  Neck: Normal range of motion. Neck supple.  Cardiovascular: Normal rate, regular rhythm and normal heart sounds.   Pulmonary/Chest: He has wheezes.  Abdominal: Soft.  Musculoskeletal: Normal range of motion.  Neurological: He is alert and oriented to person, place, and time. He has normal reflexes.  Skin: Skin is warm.  Psychiatric: He has a normal mood and affect.    ED Course  Procedures (including critical care time)  Labs Reviewed - No data to display Dg Chest 2 View  10/28/2011  *RADIOLOGY REPORT*  Clinical Data: Cough, congestion, wheezing  CHEST - 2 VIEW  Comparison: None.  Findings: The lungs are very hyperaerated consistent with obstructive pulmonary disease or possibly asthma.  There is some peribronchial thickening which may indicate bronchitis.  No active infiltrate or effusion is seen.  The heart is within normal limits in size.  No bony abnormality is noted.  IMPRESSION: Hyperaeration and peribronchial thickening may indicate asthma or bronchitis.  No pneumonia.  Original Report Authenticated By: Juline Patch, M.D.     No diagnosis found.    MDM  Pt given solumedrol Iv,  No relief  with 1st neb.  Pt placed on one hour continous neb,  (Pt stopped mid neb)  Pt finished neb after eating.  Pt reexamined.  He feels much better,  Decreased wheezing.   Respiratory therapist evaluated and pt had continued wheezing.  Pt observed and given additional neb.  Pt reports feeling better.  On revaluation before discharge Lungs are clear,    Pt given albuterol inhaler here.  Pt given rx for prednisone and albuterol neb solution       Langston Masker, [place] 10/28/11 1702  Langston Masker, [place] 10/28/11 1930

## 2011-10-28 NOTE — ED Notes (Signed)
Pt is unable to tolerate bi-pap. Dr. Adriana Simas informed

## 2011-10-29 DIAGNOSIS — J45901 Unspecified asthma with (acute) exacerbation: Secondary | ICD-10-CM

## 2011-10-29 DIAGNOSIS — J45902 Unspecified asthma with status asthmaticus: Secondary | ICD-10-CM

## 2011-10-29 LAB — BASIC METABOLIC PANEL
BUN: 11 mg/dL (ref 6–23)
Chloride: 108 mEq/L (ref 96–112)
Glucose, Bld: 155 mg/dL — ABNORMAL HIGH (ref 70–99)
Potassium: 4.1 mEq/L (ref 3.5–5.1)

## 2011-10-29 LAB — GLUCOSE, CAPILLARY
Glucose-Capillary: 127 mg/dL — ABNORMAL HIGH (ref 70–99)
Glucose-Capillary: 129 mg/dL — ABNORMAL HIGH (ref 70–99)

## 2011-10-29 MED ORDER — FLUTICASONE-SALMETEROL 250-50 MCG/DOSE IN AEPB
1.0000 | INHALATION_SPRAY | Freq: Two times a day (BID) | RESPIRATORY_TRACT | Status: DC
  Administered 2011-10-29: 1 via RESPIRATORY_TRACT
  Filled 2011-10-29: qty 14

## 2011-10-29 MED ORDER — GUAIFENESIN-CODEINE 100-10 MG/5ML PO SOLN
10.0000 mL | Freq: Four times a day (QID) | ORAL | Status: DC | PRN
  Administered 2011-10-29: 10 mL via ORAL
  Filled 2011-10-29 (×2): qty 5

## 2011-10-29 MED ORDER — ALBUTEROL (5 MG/ML) CONTINUOUS INHALATION SOLN
5.0000 mg/h | INHALATION_SOLUTION | RESPIRATORY_TRACT | Status: DC
  Administered 2011-10-29: 5 mg/h via RESPIRATORY_TRACT
  Filled 2011-10-29: qty 20

## 2011-10-29 MED ORDER — LORAZEPAM 2 MG/ML IJ SOLN
INTRAMUSCULAR | Status: AC
  Filled 2011-10-29: qty 1

## 2011-10-29 MED ORDER — FLUTICASONE PROPIONATE 50 MCG/ACT NA SUSP
2.0000 | Freq: Every day | NASAL | Status: DC
  Administered 2011-10-29 – 2011-11-04 (×6): 2 via NASAL
  Filled 2011-10-29 (×3): qty 16

## 2011-10-29 MED ORDER — ONDANSETRON HCL 4 MG PO TABS: 4.0000 mg | ORAL_TABLET | Freq: Four times a day (QID) | ORAL | Status: DC | PRN

## 2011-10-29 MED ORDER — MORPHINE SULFATE 2 MG/ML IJ SOLN: 2.0000 mg | INTRAMUSCULAR | Status: DC | PRN

## 2011-10-29 MED ORDER — ALPRAZOLAM 0.5 MG PO TABS
0.5000 mg | ORAL_TABLET | Freq: Two times a day (BID) | ORAL | Status: DC
  Administered 2011-10-29 (×2): 0.5 mg via ORAL
  Filled 2011-10-29 (×2): qty 1

## 2011-10-29 MED ORDER — GUAIFENESIN ER 600 MG PO TB12
600.0000 mg | ORAL_TABLET | Freq: Two times a day (BID) | ORAL | Status: DC
  Administered 2011-10-29 – 2011-11-04 (×12): 600 mg via ORAL
  Filled 2011-10-29 (×14): qty 1

## 2011-10-29 MED ORDER — ALBUTEROL SULFATE (5 MG/ML) 0.5% IN NEBU
2.5000 mg | INHALATION_SOLUTION | RESPIRATORY_TRACT | Status: DC
  Administered 2011-10-29 – 2011-10-30 (×5): 2.5 mg via RESPIRATORY_TRACT
  Filled 2011-10-29 (×5): qty 0.5

## 2011-10-29 MED ORDER — MORPHINE SULFATE 4 MG/ML IJ SOLN
INTRAMUSCULAR | Status: AC
  Administered 2011-10-29: 2 mg
  Filled 2011-10-29: qty 1

## 2011-10-29 MED ORDER — PANTOPRAZOLE SODIUM 40 MG IV SOLR
40.0000 mg | Freq: Every day | INTRAVENOUS | Status: DC
  Administered 2011-10-29 – 2011-10-31 (×3): 40 mg via INTRAVENOUS
  Filled 2011-10-29 (×5): qty 40

## 2011-10-29 MED ORDER — SODIUM CHLORIDE 0.9 % IV SOLN: 250.0000 mL | INTRAVENOUS | Status: DC | PRN

## 2011-10-29 MED ORDER — SODIUM CHLORIDE 0.9 % IJ SOLN: 3.0000 mL | INTRAMUSCULAR | Status: DC | PRN

## 2011-10-29 MED ORDER — ALBUTEROL SULFATE (5 MG/ML) 0.5% IN NEBU: 2.5000 mg | INHALATION_SOLUTION | RESPIRATORY_TRACT | Status: DC | PRN

## 2011-10-29 MED ORDER — SODIUM CHLORIDE 0.9 % IJ SOLN
3.0000 mL | Freq: Two times a day (BID) | INTRAMUSCULAR | Status: DC
  Administered 2011-10-29 – 2011-10-30 (×3): 3 mL via INTRAVENOUS
  Administered 2011-10-30: 22:00:00 via INTRAVENOUS
  Administered 2011-10-31 – 2011-11-04 (×7): 3 mL via INTRAVENOUS

## 2011-10-29 MED ORDER — ACETAMINOPHEN 650 MG RE SUPP: 650.0000 mg | Freq: Four times a day (QID) | RECTAL | Status: DC | PRN

## 2011-10-29 MED ORDER — PREDNISONE 50 MG PO TABS
50.0000 mg | ORAL_TABLET | Freq: Every day | ORAL | Status: DC
  Administered 2011-10-29: 50 mg via ORAL
  Filled 2011-10-29: qty 1

## 2011-10-29 MED ORDER — METHYLPREDNISOLONE SODIUM SUCC 125 MG IJ SOLR
125.0000 mg | Freq: Once | INTRAMUSCULAR | Status: AC
  Administered 2011-10-29: 125 mg via INTRAVENOUS
  Filled 2011-10-29: qty 2

## 2011-10-29 MED ORDER — INSULIN ASPART 100 UNIT/ML ~~LOC~~ SOLN
0.0000 [IU] | Freq: Every day | SUBCUTANEOUS | Status: DC
  Filled 2011-10-29: qty 3

## 2011-10-29 MED ORDER — SODIUM CHLORIDE 0.9 % IN NEBU
INHALATION_SOLUTION | RESPIRATORY_TRACT | Status: AC
  Administered 2011-10-29: 10:00:00
  Filled 2011-10-29: qty 3

## 2011-10-29 MED ORDER — METHYLPREDNISOLONE SODIUM SUCC 125 MG IJ SOLR
80.0000 mg | Freq: Four times a day (QID) | INTRAMUSCULAR | Status: DC
Start: 2011-10-29 — End: 2011-10-30
  Administered 2011-10-28: 125 mg via INTRAVENOUS
  Administered 2011-10-29 – 2011-10-30 (×4): 80 mg via INTRAVENOUS
  Filled 2011-10-29 (×2): qty 1.28
  Filled 2011-10-29: qty 2
  Filled 2011-10-29 (×2): qty 1.28
  Filled 2011-10-29: qty 2
  Filled 2011-10-29: qty 1.28

## 2011-10-29 MED ORDER — IPRATROPIUM BROMIDE 0.02 % IN SOLN: 0.5000 mg | RESPIRATORY_TRACT | Status: DC

## 2011-10-29 MED ORDER — ACETAMINOPHEN 325 MG PO TABS: 650.0000 mg | ORAL_TABLET | Freq: Four times a day (QID) | ORAL | Status: DC | PRN

## 2011-10-29 MED ORDER — ONDANSETRON HCL 4 MG/2ML IJ SOLN: 4.0000 mg | Freq: Four times a day (QID) | INTRAMUSCULAR | Status: DC | PRN

## 2011-10-29 MED ORDER — DILTIAZEM HCL 60 MG PO TABS
60.0000 mg | ORAL_TABLET | Freq: Once | ORAL | Status: AC
  Administered 2011-10-29: 60 mg via ORAL
  Filled 2011-10-29 (×2): qty 1

## 2011-10-29 MED ORDER — IPRATROPIUM BROMIDE 0.02 % IN SOLN: 0.5000 mg | RESPIRATORY_TRACT | Status: DC | PRN

## 2011-10-29 MED ORDER — ENOXAPARIN SODIUM 40 MG/0.4ML ~~LOC~~ SOLN
40.0000 mg | SUBCUTANEOUS | Status: DC
  Administered 2011-10-29 – 2011-11-04 (×7): 40 mg via SUBCUTANEOUS
  Filled 2011-10-29 (×7): qty 0.4

## 2011-10-29 MED ORDER — ALBUTEROL SULFATE (5 MG/ML) 0.5% IN NEBU: 2.5000 mg | INHALATION_SOLUTION | RESPIRATORY_TRACT | Status: DC

## 2011-10-29 MED ORDER — ALBUTEROL SULFATE (5 MG/ML) 0.5% IN NEBU
INHALATION_SOLUTION | RESPIRATORY_TRACT | Status: AC
  Filled 2011-10-29: qty 1

## 2011-10-29 MED ORDER — DEXTROSE 5 % IV SOLN
500.0000 mg | INTRAVENOUS | Status: DC
  Administered 2011-10-29 – 2011-10-30 (×2): 500 mg via INTRAVENOUS
  Filled 2011-10-29 (×3): qty 500

## 2011-10-29 MED ORDER — DEXTROSE 5 % IV SOLN
1.0000 g | INTRAVENOUS | Status: DC
  Administered 2011-10-29 – 2011-10-30 (×2): 1 g via INTRAVENOUS
  Filled 2011-10-29 (×3): qty 10

## 2011-10-29 MED ORDER — METHYLPREDNISOLONE SODIUM SUCC 125 MG IJ SOLR: 60.0000 mg | Freq: Four times a day (QID) | INTRAMUSCULAR | Status: DC

## 2011-10-29 MED ORDER — MORPHINE SULFATE 2 MG/ML IJ SOLN
2.0000 mg | INTRAMUSCULAR | Status: DC | PRN
  Filled 2011-10-29: qty 1

## 2011-10-29 MED ORDER — MORPHINE SULFATE 2 MG/ML IJ SOLN
2.0000 mg | INTRAMUSCULAR | Status: DC | PRN
  Administered 2011-10-29: 2 mg via INTRAVENOUS
  Filled 2011-10-29: qty 1

## 2011-10-29 MED ORDER — INSULIN ASPART 100 UNIT/ML ~~LOC~~ SOLN
0.0000 [IU] | Freq: Three times a day (TID) | SUBCUTANEOUS | Status: DC
  Administered 2011-10-29: 2 [IU] via SUBCUTANEOUS
  Administered 2011-10-30 (×3): 1 [IU] via SUBCUTANEOUS

## 2011-10-29 MED ORDER — LORAZEPAM 2 MG/ML IJ SOLN
1.0000 mg | INTRAMUSCULAR | Status: DC | PRN
  Administered 2011-10-29 (×3): 1 mg via INTRAVENOUS
  Filled 2011-10-29 (×2): qty 1

## 2011-10-29 MED ORDER — ALBUTEROL SULFATE (5 MG/ML) 0.5% IN NEBU
2.5000 mg | INHALATION_SOLUTION | RESPIRATORY_TRACT | Status: DC | PRN
  Administered 2011-10-29 (×2): 2.5 mg via RESPIRATORY_TRACT
  Filled 2011-10-29 (×2): qty 0.5

## 2011-10-29 NOTE — ED Notes (Signed)
RT at bedside.

## 2011-10-29 NOTE — Consult Note (Signed)
Name: [name] Hall MRN: [id number] DOB: November 08, 1985    LOS: 1  PCCM ADMISSION NOTE  History of Present Illness: This is a 26 year old white male previous history of asthma prior to this controlled only with as needed short acting bronchodilator. The patient  recently stop smoking one month ago. The patient presented  to  the emergency room over the  last 24 hours with increasing shortness of breath with wheezing and acute status asthmaticus. The patient was admitted by the hospitalist to the intensive care in. Patient's progress overnight is notable that  he is having increasing spells of bronchospasm and pulmonary is asked to assist in the care of this patient.  The patient had a prior episode of status asthmaticus this severe at the age of 26. He has never been on life-support. Has not been to the emergency room frequently. The patient does note significant sinus disease and rhinitis. The patient denies significant reflux symptoms at this time. The patient has not had significant allergy testing in the past.  Lines / Drains: none  Cultures: none  Antibiotics: Rocephin 1/4 zmax 1/4  Tests / Events:    Past Medical History  Diagnosis Date  . Asthma    History reviewed. No pertinent past surgical history. Prior to Admission medications   Medication Sig Start Date End Date Taking? Authorizing Provider  albuterol (PROVENTIL HFA;VENTOLIN HFA) 108 (90 BASE) MCG/ACT inhaler Inhale 2 puffs into the lungs every 6 (six) hours as needed. For shortness of breath   Yes Historical Provider, MD  albuterol (PROVENTIL) (2.5 MG/3ML) 0.083% nebulizer solution Take 3 mLs (2.5 mg total) by nebulization every 6 (six) hours as needed for wheezing. 09/23/11 09/22/12 Yes Heather Zenaida Niece Wingen  predniSONE (DELTASONE) 10 MG tablet 5,4,3,2,1 taper  10/28/11   Langston Masker, PA   Allergies Allergies  Allergen Reactions  . Uncoded Nonscreenable Allergen     Grass,trees, cats, & dogs: congestion    Family  History History reviewed. No pertinent family history.  Social History  reports that he has quit smoking. He does not have any smokeless tobacco history on file. He reports that he drinks alcohol. He reports that he uses illicit drugs (Marijuana).  Review Of Systems  11 points review of systems is negative with an exception of listed in HPI.  Vital Signs: Temp:  [97.4 F (36.3 C)-99.9 F (37.7 C)] 98.2 F (36.8 C) (01/05 0819) Pulse Rate:  [88-140] 114  (01/05 0900) Resp:  [18-40] 26  (01/05 0900) BP: (103-143)/(65-99) 121/67 mmHg (01/05 0600) SpO2:  [89 %-100 %] 100 % (01/05 0900) FiO2 (%):  [100 %] 100 % (01/04 2237) Weight:  [65.1 kg (143 lb 8.3 oz)-70.308 kg (155 lb)] 143 lb 8.3 oz (65.1 kg) (01/05 0200) I/O last 3 completed shifts: In: 120 [P.O.:120] Out: 200 [Urine:200]  Physical Examination: General:  Anxious white male in moderate respiratory distress most of the history is obtained by the patient's mother Neuro:  Anxious but alert moves all fours   HEENT:  Dry mucous membranes, nasal rhinitis Neck:  Neck supple no thyromegaly   Cardiovascular:  Tachycardia, normal S1-S2 no S3-S4  no murmurs rubs or gallops Lungs:  Inspiratory expiratory wheeze with very poor air movement Abdomen:  Soft nontender bowel sounds active Musculoskeletal:  Full range of motion no joint deformity  Skin:  Clear  Ventilator settings: Vent Mode:  [-]  FiO2 (%):  [100 %] 100 %  NRM   Labs and Imaging:  Reviewed.  Please refer to the  Assessment and Plan section for relevant results.  Dg Chest 2 View  10/28/2011  *RADIOLOGY REPORT*  Clinical Data: Cough, congestion, wheezing  CHEST - 2 VIEW  Comparison: None.  Findings: The lungs are very hyperaerated consistent with obstructive pulmonary disease or possibly asthma.  There is some peribronchial thickening which may indicate bronchitis.  No active infiltrate or effusion is seen.  The heart is within normal limits in size.  No bony abnormality is  noted.  IMPRESSION: Hyperaeration and peribronchial thickening may indicate asthma or bronchitis.  No pneumonia.  Original Report Authenticated By: Juline Patch, M.D.   Dg Chest Port 1 View  10/28/2011  *RADIOLOGY REPORT*  Clinical Data: Asthma, shortness of breath, chest pain.  PORTABLE CHEST - 1 VIEW  Comparison: 10/28/2011  Findings: Hyperinflation is similar to prior. The peribronchial cuff appears slightly decreased.  No focal areas of consolidation. No pleural effusion or pneumothorax.  No acute osseous abnormality.  IMPRESSION: Hyperinflation with mild central peribronchial cuffing as can be seen with reactive airway disease.  No focal consolidation.  Original Report Authenticated By: Waneta Martins, M.D.          Assessment and Plan: 1. status asthmaticus with acute respiratory failure Plan Begin continuous albuterol treatment Adjust corticosteroids further Maintain Rocephin and Zithromax for now Initiate nasal steroid Follow patient closely with oxygen titration This patient will need outpatient pulmonary followup  Best practices / Disposition: -->ICU status  -->full code -->Heparin for DVT Px -->Protonix for GI Px -->diet clear liq -->family updated at bedside  The patient is critically ill with multiple organ systems failure and requires high complexity decision making for assessment and support, frequent evaluation and titration of therapies, application of advanced monitoring technologies and extensive interpretation of multiple databases. Critical Care Time devoted to patient care services described in this note is 45 minutes.  Shan Levans  M.D. Pulmonary and Critical Care Medicine [facility] Cell: [phone]   Pager: [phone] 10/29/2011, 9:54 AM

## 2011-10-29 NOTE — H&P (Addendum)
Chief Complaint: Acute asthma exacerbation  History of Present Illness: Patient is a 26 year old man history of asthma usually controlled on inhalers as needed, who presents for evaluation of acute shortness of breath. Patient reports that he was in his usual state of health until approximately 2 days ago when he started to experience abdominal pain. Reports that the pain persisted for the past 2 days. However, yesterday, his asthma starts flaring up. He initially tried his inhalers, which did not help. Earlier today, he reports that he collapsed at work due to the respiratory difficulty and was sent to [facility] where he was treated and discharged to home. However, he reports that he had to return for further treatment because the treatment that he was given before did not help. Does report baseline rhinorrhea which he normally gets about this time of year. No other recent URI symptoms or sick contacts. Denies any recent tobacco, having quit approximately one month ago. However, he does report occasional marijuana use, most recently yesterday, which he reports normally soothes any potential exacerbation. He reports never having had to be intubated or require ICU admission, with most recent flare of similar severity many many years ago.  In the emergency room, temperature 98.6, blood pressure 140/80, heart rate 135, respirations 40, satting 94% on nonrebreather. He was given DuoNeb treatment with Magnesium sulfate and 1 dose of Ativan. Respiratory rate now down to 24 satting 100% on nonrebreather. Given improvement with medications, patient was requested to be admitted to the medical service for further management.  Past Medical/Surgical History: Asthma  Allergies: None  Medications: Albuterol as needed  Family History: Strong family history of asthma on his father side  Social History: Single, currently works Quit tobacco one month ago. No ethanol Occasional marijuana  use as above  Review of Systems: General: No fevers, chills, sweats, night sweats, weight loss Skin: No rashes or lacerations HEENT: No rhinorrhea, sore throat, dry mouth, hearing difficulties Pulmonary: As per history of present illness Cardivascular: No chest pain, dyspnea on exertion, palpitations, lightheaded/dizziness, paroxysmal nocturnal dyspnea, orthopnea Gastrointestinal: No abdominal pain, dysphagia, odynophagia, nausea, vomiting, hematemesis, melena, hematochezia, bowel changes Genitourinary: No dysuria, hematuria, increased urinary frequency/urgency. No discharge Musculoskeletal: No muscle aches, pain. No arthritis Hematologic: No easy bruising or bleeding Neurologic: No headaches, vision changes, focal neurologic deficits Psychologic: No suicidial or homicidal ideation. No depression  Filed Vitals:   10/28/11 2321 10/28/11 2330 10/28/11 2331 10/29/11 0000  BP: 126/65  120/65   Pulse: 120 127 122 123  Temp:   97.8 F (36.6 C)   TempSrc:   Oral   Resp: 23 29 23 24   Height:      Weight:      SpO2: 100%  100%     Physical Exam: General: Alert and oriented x 3, no apparent distress Skin: No rashes, bruises HEENT: Head atraumatic, sclera anicertic, pupils equal and reactive to light, oropharynx moist with tonsils unremarkable Neck: Soft, no lymphadenopathy, thyromegaly, or bruits Chest: Prolonged end expiratory phase with diffuse wheezing bilaterally Heart: Tachycardic, normal S1/S2 no rubs, gallops, or murmurs Abdomen: Soft, nontender, nondistended, + bowel sounds, no masses Extremities: No cyanosis, clubbing, or edema. 2+ radial and dorsalis pedis pulses bilaterally Neurologic: Grossly intact   Labs: CBC    Component Value Date/Time   WBC 14.1* 10/28/2011 2239   RBC 5.37 10/28/2011 2239   HGB 16.1 10/28/2011 2239   HCT 45.5 10/28/2011 2239   PLT 226 10/28/2011 2239   MCV 84.7 10/28/2011  2239   MCH 30.0 10/28/2011 2239   MCHC 35.4 10/28/2011 2239   RDW 12.7 10/28/2011 2239     LYMPHSABS 0.4* 10/28/2011 2239   MONOABS 0.2 10/28/2011 2239   EOSABS 0.0 10/28/2011 2239   BASOSABS 0.0 10/28/2011 2239    BMET    Component Value Date/Time   NA 140 10/28/2011 2239   K 3.7 10/28/2011 2239   CL 105 10/28/2011 2239   CO2 17* 10/28/2011 2239   GLUCOSE 187* 10/28/2011 2239   BUN 12 10/28/2011 2239   CREATININE 0.68 10/28/2011 2239   CALCIUM 9.4 10/28/2011 2239   GFRNONAA >90 10/28/2011 2239   GFRAA >90 10/28/2011 2239   Chest radiograph: IMPRESSION:  Hyperinflation with mild central peribronchial cuffing as can be  seen with reactive airway disease. No focal consolidation.   Impression/Plan: 26 year old man history of asthma usually controlled on inhalers as needed, who presents for evaluation of asthma exacerbation which has not been well-controlled. The patient is currently afebrile and hemodynamically stable with improved respiratory status post nebulizer and maxillofacial magnesium sulfate treatment, on a nonrebreather.  Acute asthma exacerbation: - Admit to step down - Continue nonrebreather, with BiPAP at bedside - Solu-Medrol 125 mg x1, starting prednisone 50 mg by mouth daily tomorrow - Start long-acting beta agonist/steroid combination - Albuterol nebulizer as needed - Monitor O2 sats and respiratory status aggressively  Leukocytosis: Likely related to recent steroid administration - Monitor daily CBCs  Fluid/electrolytes/nutrition: - Hep-locked IV - Monitor electrolytes daily given beta agonists - NPO with ice chips for now given respiratory status  Prophylaxis: - Lovenox  CODE STATUS: Full code

## 2011-10-29 NOTE — ED Provider Notes (Signed)
History     CSN: [id number]  Arrival date & time 10/28/11  2148   First MD Initiated Contact with Patient 10/28/11 2205      Chief Complaint  Patient presents with  . Asthma  . Shortness of Breath    (Consider location/radiation/quality/duration/timing/severity/associated sxs/prior treatment) HPI... patient has known history of asthma. Has a serious flareup today. Went to [facility] discharge. Continues to have severe dyspnea. Has tried his normal breathing treatments without success. Has never been intubated but came close one time approximately 10 years ago.  Level V caveat for urgent need for intervention and severe illness  Past Medical History  Diagnosis Date  . Asthma     History reviewed. No pertinent past surgical history.  History reviewed. No pertinent family history.  History  Substance Use Topics  . Smoking status: Former Smoker -- 0.5 packs/day  . Smokeless tobacco: Not on file  . Alcohol Use: Yes      Review of Systems  Unable to perform ROS: Unstable vital signs    Allergies  Uncoded nonscreenable allergen  Home Medications   Current Outpatient Rx  Name Route Sig Dispense Refill  . ALBUTEROL SULFATE HFA 108 (90 BASE) MCG/ACT IN AERS Inhalation Inhale 2 puffs into the lungs every 6 (six) hours as needed. For shortness of breath    . ALBUTEROL SULFATE (2.5 MG/3ML) 0.083% IN NEBU Nebulization Take 3 mLs (2.5 mg total) by nebulization every 6 (six) hours as needed for wheezing. 75 mL 12  . PREDNISONE 10 MG PO TABS  5,4,3,2,1 taper       BP 120/65  Pulse 122  Temp(Src) 97.8 F (36.6 C) (Oral)  Resp 23  Ht 5\' 11"  (1.803 m)  Wt 155 lb (70.308 kg)  BMI 21.62 kg/m2  SpO2 100%  Physical Exam  Nursing note and vitals reviewed. Constitutional: He is oriented to person, place, and time.       Tachypnea, tachycardic, dyspneic  HENT:  Head: Normocephalic and atraumatic.  Eyes: Conjunctivae and EOM are normal. Pupils are equal,  round, and reactive to light.  Neck: Normal range of motion. Neck supple.  Cardiovascular: Normal rate and regular rhythm.   Pulmonary/Chest: Effort normal.       Not moving air well, expiratory wheezes  Abdominal: Soft. Bowel sounds are normal.  Musculoskeletal: Normal range of motion.  Neurological: He is alert and oriented to person, place, and time.  Skin: Skin is warm and dry.  Psychiatric: He has a normal mood and affect.    ED Course  Procedures (including critical care time)  Labs Reviewed  CBC - Abnormal; Notable for the following:    WBC 14.1 (*)    All other components within normal limits  DIFFERENTIAL - Abnormal; Notable for the following:    Neutrophils Relative 96 (*)    Neutro Abs 13.6 (*)    Lymphocytes Relative 3 (*)    Lymphs Abs 0.4 (*)    Monocytes Relative 1 (*)    All other components within normal limits  BASIC METABOLIC PANEL - Abnormal; Notable for the following:    CO2 17 (*)    Glucose, Bld 187 (*)    All other components within normal limits   Dg Chest 2 View  10/28/2011  *RADIOLOGY REPORT*  Clinical Data: Cough, congestion, wheezing  CHEST - 2 VIEW  Comparison: None.  Findings: The lungs are very hyperaerated consistent with obstructive pulmonary disease or possibly asthma.  There is some peribronchial thickening  which may indicate bronchitis.  No active infiltrate or effusion is seen.  The heart is within normal limits in size.  No bony abnormality is noted.  IMPRESSION: Hyperaeration and peribronchial thickening may indicate asthma or bronchitis.  No pneumonia.  Original Report Authenticated By: Juline Patch, M.D.   Dg Chest Port 1 View  10/28/2011  *RADIOLOGY REPORT*  Clinical Data: Asthma, shortness of breath, chest pain.  PORTABLE CHEST - 1 VIEW  Comparison: 10/28/2011  Findings: Hyperinflation is similar to prior. The peribronchial cuff appears slightly decreased.  No focal areas of consolidation. No pleural effusion or pneumothorax.  No acute  osseous abnormality.  IMPRESSION: Hyperinflation with mild central peribronchial cuffing as can be seen with reactive airway disease.  No focal consolidation.  Original Report Authenticated By: Waneta Martins, M.D.     1. Asthma       MDM  CRITICAL CARE Performed by: Donnetta Hutching   Total critical care time: 30  Critical care time was exclusive of separately billable procedures and treating other patients.  Critical care was necessary to treat or prevent imminent or life-threatening deterioration.  Critical care was time spent personally by me on the following activities: development of treatment plan with patient and/or surrogate as well as nursing, discussions with consultants, evaluation of patient's response to treatment, examination of patient, obtaining history from patient or surrogate, ordering and performing treatments and interventions, ordering and review of laboratory studies, ordering and review of radiographic studies, pulse oximetry and re-evaluation of patient's condition.     Patient has severe asthma;  multiple rechecks, IV steroids, albuterol Atrovent treatments, BiPAP attempted but failed.  No intubation necessary at this time   Donnetta Hutching, MD 10/29/11 [phone]

## 2011-10-29 NOTE — Progress Notes (Signed)
Subjective: Still significantly SOB, has cough with yellow-green phlegm.  Objective: Vital signs in last 24 hours: Temp:  [97.4 F (36.3 C)-99.9 F (37.7 C)] 98.2 F (36.8 C) (01/05 0402) Pulse Rate:  [88-140] 108  (01/05 0728) Resp:  [18-40] 26  (01/05 0728) BP: (103-143)/(65-99) 121/67 mmHg (01/05 0600) SpO2:  [89 %-100 %] 100 % (01/05 0819) FiO2 (%):  [100 %] 100 % (01/04 2237) Weight:  [65.1 kg (143 lb 8.3 oz)-70.308 kg (155 lb)] 143 lb 8.3 oz (65.1 kg) (01/05 0200) Weight change:     Intake/Output from previous day: 01/04 0701 - 01/05 0700 In: 120 [P.O.:120] Out: 200 [Urine:200]     Physical Exam: General: Alert, communicative in broken sentences, fully oriented.  HEENT:  No clinical pallor, no jaundice, no conjunctival injection or discharge. Hydration appears fair. NECK:  Supple, JVP not seen, no carotid bruits, no palpable lymphadenopathy, no palpable goiter. CHEST:  Bilateral polyphonic expiratory wheeze, no crackles. HEART:  Sounds 1 and 2 heard, normal, regular, tachycardic, no murmurs. ABDOMEN:  Flat, soft, non-tender, no palpable organomegaly, no palpable masses, normal bowel sounds. GENITALIA:  Not examined. LOWER EXTREMITIES:  No pitting edema, palpable peripheral pulses. MUSCULOSKELETAL SYSTEM:  Unremarkable. CENTRAL NERVOUS SYSTEM:  No focal neurologic deficit on gross examination.  Lab Results:  [facility] 10/28/11 2239  WBC 14.1*  HGB 16.1  HCT 45.5  PLT 226    Basename 10/29/11 0300 10/28/11 2239  NA 140 140  K 4.1 3.7  CL 108 105  CO2 20 17*  GLUCOSE 155* 187*  BUN 11 12  CREATININE 0.71 0.68  CALCIUM 8.8 9.4   Recent Results (from the past 240 hour(s))  MRSA PCR SCREENING     Status: Normal   Collection Time   10/29/11  2:07 AM      Component Value Range Status Comment   MRSA by PCR NEGATIVE  NEGATIVE  Final      Studies/Results: Dg Chest 2 View  10/28/2011  *RADIOLOGY REPORT*  Clinical Data: Cough, congestion, wheezing  CHEST - 2  VIEW  Comparison: None.  Findings: The lungs are very hyperaerated consistent with obstructive pulmonary disease or possibly asthma.  There is some peribronchial thickening which may indicate bronchitis.  No active infiltrate or effusion is seen.  The heart is within normal limits in size.  No bony abnormality is noted.  IMPRESSION: Hyperaeration and peribronchial thickening may indicate asthma or bronchitis.  No pneumonia.  Original Report Authenticated By: Juline Patch, M.D.   Dg Chest Port 1 View  10/28/2011  *RADIOLOGY REPORT*  Clinical Data: Asthma, shortness of breath, chest pain.  PORTABLE CHEST - 1 VIEW  Comparison: 10/28/2011  Findings: Hyperinflation is similar to prior. The peribronchial cuff appears slightly decreased.  No focal areas of consolidation. No pleural effusion or pneumothorax.  No acute osseous abnormality.  IMPRESSION: Hyperinflation with mild central peribronchial cuffing as can be seen with reactive airway disease.  No focal consolidation.  Original Report Authenticated By: Waneta Martins, M.D.    Medications: Scheduled Meds:   . albuterol  5 mg Nebulization Once  . enoxaparin  40 mg Subcutaneous Q24H  . Fluticasone-Salmeterol  1 puff Inhalation BID  . ipratropium  0.5 mg Nebulization Once  . LORazepam      . LORazepam  1 mg Intravenous Once  . magnesium sulfate 1 - 4 g bolus IVPB  2 g Intravenous To ER  . methylPREDNISolone (SOLU-MEDROL) injection  125 mg Intravenous Once  . methylPREDNISolone sodium succinate      .  morphine      . predniSONE  50 mg Oral Q breakfast  . sodium chloride  3 mL Intravenous Q12H  . DISCONTD: magnesium sulfate  2 g Intravenous Once   Continuous Infusions:  PRN Meds:.sodium chloride, acetaminophen, acetaminophen, albuterol, LORazepam, morphine injection, ondansetron (ZOFRAN) IV, ondansetron, sodium chloride  Assessment/Plan:  Principal Problem:  *Asthma exacerbation: This is acute, severe. Patient currently has high oxygen  requirements is tachycardic, and talking in incomplete sentences. Accessory respiratory muscles are mildly  in play.  Will do ABG, place on scheduled 4 hourly DuoNeb, add Mucinex and  continue parenteral steroids. *Active problems* 1. Acute Bronchitis: Evidenced by purulent phlegm, and CXR devoid of focal consolidation. This is the obvious culprit for infective exacerbation. Will manage as above, and add Rocephin/Azithromycin.  Comment: Patient is currently at high risk for decompensation. Will consult PCCM.   LOS: 1 day   Brittiany Wiehe,[name] 10/29/2011, 8:52 AM

## 2011-10-29 NOTE — Plan of Care (Signed)
Problem: ICU Phase Progression Outcomes Goal: O2 sats trending toward baseline Outcome: Completed/Met Date Met:  10/29/11 sats 100% even when wheezing and in resp distress using accessory muscles.

## 2011-10-30 DIAGNOSIS — R45851 Suicidal ideations: Secondary | ICD-10-CM

## 2011-10-30 DIAGNOSIS — F29 Unspecified psychosis not due to a substance or known physiological condition: Secondary | ICD-10-CM

## 2011-10-30 DIAGNOSIS — F3189 Other bipolar disorder: Secondary | ICD-10-CM

## 2011-10-30 DIAGNOSIS — J45902 Unspecified asthma with status asthmaticus: Secondary | ICD-10-CM

## 2011-10-30 LAB — GLUCOSE, CAPILLARY
Glucose-Capillary: 131 mg/dL — ABNORMAL HIGH (ref 70–99)
Glucose-Capillary: 131 mg/dL — ABNORMAL HIGH (ref 70–99)
Glucose-Capillary: 124 mg/dL — ABNORMAL HIGH (ref 70–99)

## 2011-10-30 LAB — CBC
Hemoglobin: 14.7 g/dL (ref 13.0–17.0)
Platelets: 253 10*3/uL (ref 150–400)
RBC: 4.99 MIL/uL (ref 4.22–5.81)
WBC: 23.1 10*3/uL — ABNORMAL HIGH (ref 4.0–10.5)

## 2011-10-30 LAB — BASIC METABOLIC PANEL
CO2: 27 mEq/L (ref 19–32)
Chloride: 106 mEq/L (ref 96–112)
Potassium: 4.5 mEq/L (ref 3.5–5.1)
Sodium: 139 mEq/L (ref 135–145)

## 2011-10-30 MED ORDER — LORAZEPAM 2 MG/ML IJ SOLN
2.0000 mg | INTRAMUSCULAR | Status: DC | PRN
  Administered 2011-10-30 (×2): 2 mg via INTRAVENOUS
  Administered 2011-10-30: 1 mg via INTRAVENOUS
  Administered 2011-10-31 – 2011-11-01 (×3): 2 mg via INTRAVENOUS
  Filled 2011-10-30 (×9): qty 1

## 2011-10-30 MED ORDER — ALBUTEROL SULFATE (5 MG/ML) 0.5% IN NEBU
2.5000 mg | INHALATION_SOLUTION | Freq: Four times a day (QID) | RESPIRATORY_TRACT | Status: DC
  Administered 2011-10-30 – 2011-11-03 (×15): 2.5 mg via RESPIRATORY_TRACT
  Filled 2011-10-30 (×16): qty 0.5

## 2011-10-30 MED ORDER — PREDNISONE 20 MG PO TABS
40.0000 mg | ORAL_TABLET | Freq: Every day | ORAL | Status: AC
  Administered 2011-10-30 – 2011-11-01 (×3): 40 mg via ORAL
  Filled 2011-10-30 (×3): qty 2

## 2011-10-30 MED ORDER — HALOPERIDOL LACTATE 5 MG/ML IJ SOLN
5.0000 mg | Freq: Once | INTRAMUSCULAR | Status: AC
  Administered 2011-10-30: 5 mg via INTRAVENOUS

## 2011-10-30 MED ORDER — HALOPERIDOL LACTATE 5 MG/ML IJ SOLN
INTRAMUSCULAR | Status: AC
  Filled 2011-10-30: qty 1

## 2011-10-30 MED ORDER — HALOPERIDOL LACTATE 5 MG/ML IJ SOLN
5.0000 mg | Freq: Three times a day (TID) | INTRAMUSCULAR | Status: DC | PRN
  Administered 2011-10-31 – 2011-11-01 (×2): 5 mg via INTRAVENOUS
  Filled 2011-10-30 (×3): qty 1

## 2011-10-30 MED ORDER — ZIPRASIDONE MESYLATE 20 MG IM SOLR
20.0000 mg | Freq: Once | INTRAMUSCULAR | Status: AC
  Administered 2011-10-30: 20 mg via INTRAMUSCULAR
  Filled 2011-10-30: qty 20

## 2011-10-30 MED ORDER — ALBUTEROL SULFATE (5 MG/ML) 0.5% IN NEBU
2.5000 mg | INHALATION_SOLUTION | RESPIRATORY_TRACT | Status: DC | PRN
  Administered 2011-10-31 – 2011-11-03 (×7): 2.5 mg via RESPIRATORY_TRACT
  Filled 2011-10-30 (×9): qty 0.5

## 2011-10-30 MED ORDER — HALOPERIDOL LACTATE 5 MG/ML IJ SOLN
INTRAMUSCULAR | Status: AC
  Administered 2011-10-30: 5 mg
  Filled 2011-10-30: qty 1

## 2011-10-30 MED ORDER — ALPRAZOLAM 1 MG PO TABS
1.0000 mg | ORAL_TABLET | Freq: Three times a day (TID) | ORAL | Status: DC
  Administered 2011-10-30 – 2011-11-03 (×12): 1 mg via ORAL
  Filled 2011-10-30 (×14): qty 1

## 2011-10-30 NOTE — Progress Notes (Signed)
Clinical Social Work aware of consult, however due to patient behaviors and agitation along with Hospital Police outside of patient room, CSW will hold consult and follow up in the AM if appropriate.  Will notify CSW weekday.  Will follow up.  Ashley Jacobs, MSW LCSW [phone]  Weekend coverage

## 2011-10-30 NOTE — Consult Note (Signed)
Name: [name] Hall MRN: [id number] DOB: [birth year]    LOS: 2  PCCM PROGRESS NOTE  History of Present Illness: This is a 26 year old white male with status asthmaticus and now recognized Bipolar disorder with suicidal ideation and verbal/physical threats.    Lines / Drains: none  Cultures: none  Antibiotics: Rocephin 1/4 zmax 1/4  Tests / Events: 1/5 Active suicidal ideation and agitation.  1/6 Psych eval in progress, will likely need IVC   Past Medical History  Diagnosis Date  . Asthma    History reviewed. No pertinent past surgical history. Prior to Admission medications   Medication Sig Start Date End Date Taking? Authorizing Provider  albuterol (PROVENTIL HFA;VENTOLIN HFA) 108 (90 BASE) MCG/ACT inhaler Inhale 2 puffs into the lungs every 6 (six) hours as needed. For shortness of breath   Yes Historical Provider, MD  albuterol (PROVENTIL) (2.5 MG/3ML) 0.083% nebulizer solution Take 3 mLs (2.5 mg total) by nebulization every 6 (six) hours as needed for wheezing. 09/23/11 09/22/12 Yes Heather Zenaida Niece Wingen  predniSONE (DELTASONE) 10 MG tablet 5,4,3,2,1 taper  10/28/11   Langston Masker, PA   Allergies Allergies  Allergen Reactions  . Uncoded Nonscreenable Allergen     Grass,trees, cats, & dogs: congestion    Family History History reviewed. No pertinent family history.  Social History  reports that he has quit smoking. He does not have any smokeless tobacco history on file. He reports that he drinks alcohol. He reports that he uses illicit drugs (Marijuana).  Review Of Systems  11 points review of systems is negative with an exception of listed in HPI.  Vital Signs: Temp:  [97 F (36.1 C)-98.1 F (36.7 C)] 97.6 F (36.4 C) (01/06 0800) Pulse Rate:  [27-146] 90  (01/06 0800) Resp:  [14-32] 19  (01/06 0800) BP: (97-130)/(47-78) 99/47 mmHg (01/06 0800) SpO2:  [84 %-99 %] 97 % (01/06 0846) I/O last 3 completed shifts: In: 1250 [P.O.:840; I.V.:100; IV  Piggyback:310] Out: 825 [Urine:825]  Physical Examination: General:  Less anxious with geodon.   Neuro:  alert moves all fours   HEENT:  Dry mucous membranes, nasal rhinitis Neck:  Neck supple no thyromegaly   Cardiovascular:  Tachycardia, normal S1-S2 no S3-S4  no murmurs rubs or gallops Lungs:  Improved airflow Abdomen:  Soft nontender bowel sounds active Musculoskeletal:  Full range of motion no joint deformity  Skin:  Clear   Labs and Imaging:  Reviewed.  Please refer to the Assessment and Plan section for relevant results.  Dg Chest 2 View  10/28/2011  *RADIOLOGY REPORT*  Clinical Data: Cough, congestion, wheezing  CHEST - 2 VIEW  Comparison: None.  Findings: The lungs are very hyperaerated consistent with obstructive pulmonary disease or possibly asthma.  There is some peribronchial thickening which may indicate bronchitis.  No active infiltrate or effusion is seen.  The heart is within normal limits in size.  No bony abnormality is noted.  IMPRESSION: Hyperaeration and peribronchial thickening may indicate asthma or bronchitis.  No pneumonia.  Original Report Authenticated By: Juline Patch, M.D.   Dg Chest Port 1 View  10/28/2011  *RADIOLOGY REPORT*  Clinical Data: Asthma, shortness of breath, chest pain.  PORTABLE CHEST - 1 VIEW  Comparison: 10/28/2011  Findings: Hyperinflation is similar to prior. The peribronchial cuff appears slightly decreased.  No focal areas of consolidation. No pleural effusion or pneumothorax.  No acute osseous abnormality.  IMPRESSION: Hyperinflation with mild central peribronchial cuffing as can be seen with reactive airway disease.  No focal consolidation.  Original Report Authenticated By: Waneta Martins, M.D.          Assessment and Plan: 1. status asthmaticus with acute respiratory failure Plan Agree with Rx as outlined by hospitalist service Steroid dosing may be likely exacerbating psychosis PCCM will see as needed Once pt is out of  behavioural health, pulmonary should f/u as outpt I will keep pt on my rounding list, as I am at [facility] this week   Shan Levans  M.D. Pulmonary and Critical Care Medicine [facility] Cell: [phone]   Pager: [phone] 10/30/2011, 11:03 AM

## 2011-10-30 NOTE — ED Provider Notes (Signed)
Medical screening examination/treatment/procedure(s) were performed by non-physician practitioner and as supervising physician I was immediately available for consultation/collaboration.   Ayrton Mcvay A. Patrica Duel, MD 10/30/11 4010
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
## 2011-10-30 NOTE — Progress Notes (Signed)
Subjective: Events of last night and early morning noted. Per RN, patient very agitated, combative, physically and verbally aggressive, expressing suicidal and homicidal thoughts. At this time, patient had broken through gurney restraints, and security personnel were present.  Objective: Vital signs in last 24 hours: Temp:  [97 F (36.1 C)-98.1 F (36.7 C)] 97.6 F (36.4 C) (01/06 0800) Pulse Rate:  [27-146] 90  (01/06 0800) Resp:  [14-32] 19  (01/06 0800) BP: (97-139)/(47-78) 99/47 mmHg (01/06 0800) SpO2:  [84 %-100 %] 97 % (01/06 0846) Weight change:  Last BM Date: 10/29/11  Intake/Output from previous day: 01/05 0701 - 01/06 0700 In: 1130 [P.O.:720; I.V.:100; IV Piggyback:310] Out: 625 [Urine:625] Total I/O In: 10 [I.V.:10] Out: -    Physical Exam: General: Not in acute respiratory distress, alert, communicative, fully oriented, not short of breath at rest, sitting up in bed. HEENT:  No clinical pallor, no jaundice, no conjunctival injection or discharge. NECK:  Supple, JVP not seen, no carotid bruits, no palpable lymphadenopathy, no palpable goiter. CHEST:  Clinically clear to auscultation, only few wheezes, no crackles. HEART:  Sounds 1 and 2 heard, normal, regular, no murmurs. ABDOMEN:  Not examined. GENITALIA:  Not examined. LOWER EXTREMITIES:  No pitting edema, palpable peripheral pulses. MUSCULOSKELETAL SYSTEM:  Generalized osteoarthritic changes, otherwise, normal. CENTRAL NERVOUS SYSTEM:  No focal neurologic deficit on gross examination.  Lab Results:  Basename 10/30/11 0817 10/28/11 2239  WBC 23.1* 14.1*  HGB 14.7 16.1  HCT 43.3 45.5  PLT 253 226    Basename 10/29/11 0300 10/28/11 2239  NA 140 140  K 4.1 3.7  CL 108 105  CO2 20 17*  GLUCOSE 155* 187*  BUN 11 12  CREATININE 0.71 0.68  CALCIUM 8.8 9.4   Recent Results (from the past 240 hour(s))  MRSA PCR SCREENING     Status: Normal   Collection Time   10/29/11  2:07 AM      Component Value Range  Status Comment   MRSA by PCR NEGATIVE  NEGATIVE  Final      Studies/Results: Dg Chest 2 View  10/28/2011  *RADIOLOGY REPORT*  Clinical Data: Cough, congestion, wheezing  CHEST - 2 VIEW  Comparison: None.  Findings: The lungs are very hyperaerated consistent with obstructive pulmonary disease or possibly asthma.  There is some peribronchial thickening which may indicate bronchitis.  No active infiltrate or effusion is seen.  The heart is within normal limits in size.  No bony abnormality is noted.  IMPRESSION: Hyperaeration and peribronchial thickening may indicate asthma or bronchitis.  No pneumonia.  Original Report Authenticated By: Juline Patch, M.D.   Dg Chest Port 1 View  10/28/2011  *RADIOLOGY REPORT*  Clinical Data: Asthma, shortness of breath, chest pain.  PORTABLE CHEST - 1 VIEW  Comparison: 10/28/2011  Findings: Hyperinflation is similar to prior. The peribronchial cuff appears slightly decreased.  No focal areas of consolidation. No pleural effusion or pneumothorax.  No acute osseous abnormality.  IMPRESSION: Hyperinflation with mild central peribronchial cuffing as can be seen with reactive airway disease.  No focal consolidation.  Original Report Authenticated By: Waneta Martins, M.D.    Medications: Scheduled Meds:   . albuterol  2.5 mg Nebulization Q4H  . ALPRAZolam  1 mg Oral TID  . azithromycin  500 mg Intravenous Q24H  . cefTRIAXone (ROCEPHIN)  IV  1 g Intravenous Q24H  . diltiazem  60 mg Oral Once  . enoxaparin  40 mg Subcutaneous Q24H  . fluticasone  2  spray Each Nare Daily  . guaiFENesin  600 mg Oral BID  . haloperidol lactate      . haloperidol lactate      . haloperidol lactate  5 mg Intravenous Once  . insulin aspart  0-5 Units Subcutaneous QHS  . insulin aspart  0-9 Units Subcutaneous TID WC  . LORazepam      . methylPREDNISolone (SOLU-MEDROL) injection  80 mg Intravenous Q6H  . pantoprazole (PROTONIX) IV  40 mg Intravenous Q1200  . sodium chloride  3 mL  Intravenous Q12H  . sodium chloride      . ziprasidone  20 mg Intramuscular Once  . DISCONTD: albuterol  2.5 mg Nebulization Q4H  . DISCONTD: ALPRAZolam  0.5 mg Oral BID  . DISCONTD: ipratropium  0.5 mg Nebulization Q4H  . DISCONTD: methylPREDNISolone (SOLU-MEDROL) injection  60 mg Intravenous Q6H   Continuous Infusions:   . DISCONTD: albuterol 5 mg/hr (10/29/11 1043)   PRN Meds:.sodium chloride, acetaminophen, acetaminophen, guaiFENesin-codeine, LORazepam, morphine injection, ondansetron (ZOFRAN) IV, ondansetron, sodium chloride, DISCONTD: albuterol, DISCONTD: ipratropium, DISCONTD: LORazepam, DISCONTD:  morphine injection, DISCONTD:  morphine injection  Assessment/Plan:  Principal Problem:  *Asthma exacerbation: This was acute, severe. Patient has clinically much improved, with decreased oxygen requirements, and is talking in complete sentences, without any obvious respiratory distress at this time. Will change  Nebulizer to 6 hourly, continue Mucinex and start oral steroid taper.  *Active problems*  1. Acute Bronchitis: Evidenced by purulent phlegm, and CXR devoid of focal consolidation. This is the obvious culprit for infective exacerbation. Will manage as above. Now on Rocephin/Azithromycin, day# 2.  2. Psychiatric problems: Patient appears to be having an acute psychotic episode, with suicidal and homicidal thoughts, as well as evidence of depression. I feel that he is clearly a danger to self, and perhaps to others. Psychiatric consultation has already been requested. Will involuntarily commit for now. Meanwhile, manage with prn Haldol.   Comment: Will consider transfer to med-psych floor. Patient may ultimately need inpatient psychiatric care.      LOS: 2 days   Leroi Haque,[name] 10/30/2011, 9:20 AM
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
## 2011-10-30 NOTE — Progress Notes (Signed)
Went into patient's room during his breathing treatment, pt's eyes were closed, resting but coughing with the breathing treatment. Pt sat straight up in the bed and became pulling against restraints, saying he "wanted to go home" very tearful. Pt then leaned over and bit the gurney restraint velcro, ripped it from the seam. He pulled his right arm out of the restraint. Security guards were called for help, a [employer] officer also showed up. Pt then bit oxgyen tubing in half. After talking the patient down, the other restraint was removed. 1mg  Ativan was given and 5mg  Haldol was given during this state of aggression, after Ativan had no effect. MD was at bedside. Pt calmed down and laid back down.

## 2011-10-30 NOTE — H&P (Signed)
Pt  is very agitated at the moment. States" I want to leave , I am calling my ride now, I want to put a bullet in my head and end it all".  Mother at bedside very tearful. Pt  ripped out both  IV's, pulled off teley monitor and wants to leave AMA. Primary Nurse, Charge Nurse, and Elray Mcgregor NP is at the bedside. Ativan 1 mg was giving around 21:30. Xanax mg was given at 2300. Will monitor.

## 2011-10-31 DIAGNOSIS — F29 Unspecified psychosis not due to a substance or known physiological condition: Secondary | ICD-10-CM | POA: Diagnosis present

## 2011-10-31 LAB — CBC
HCT: 44 % (ref 39.0–52.0)
MCH: 29.3 pg (ref 26.0–34.0)
MCV: 88.2 fL (ref 78.0–100.0)
Platelets: 249 10*3/uL (ref 150–400)
RBC: 4.99 MIL/uL (ref 4.22–5.81)
WBC: 20.6 10*3/uL — ABNORMAL HIGH (ref 4.0–10.5)

## 2011-10-31 LAB — RAPID URINE DRUG SCREEN, HOSP PERFORMED
Amphetamines: NOT DETECTED
Benzodiazepines: POSITIVE — AB
Opiates: POSITIVE — AB
Tetrahydrocannabinol: POSITIVE — AB

## 2011-10-31 LAB — GLUCOSE, CAPILLARY
Glucose-Capillary: 112 mg/dL — ABNORMAL HIGH (ref 70–99)
Glucose-Capillary: 114 mg/dL — ABNORMAL HIGH (ref 70–99)

## 2011-10-31 LAB — BLOOD GAS, ARTERIAL
Bicarbonate: 23.1 mEq/L (ref 20.0–24.0)
TCO2: 20.4 mmol/L (ref 0–100)
pH, Arterial: 7.344 — ABNORMAL LOW (ref 7.350–7.450)
pO2, Arterial: 497 mmHg — ABNORMAL HIGH (ref 80.0–100.0)

## 2011-10-31 LAB — BASIC METABOLIC PANEL
BUN: 21 mg/dL (ref 6–23)
CO2: 26 mEq/L (ref 19–32)
Calcium: 9.6 mg/dL (ref 8.4–10.5)
Chloride: 106 mEq/L (ref 96–112)
Creatinine, Ser: 0.71 mg/dL (ref 0.50–1.35)

## 2011-10-31 LAB — CULTURE, RESPIRATORY W GRAM STAIN

## 2011-10-31 MED ORDER — NICOTINE 21 MG/24HR TD PT24
21.0000 mg | MEDICATED_PATCH | Freq: Every day | TRANSDERMAL | Status: DC
  Administered 2011-11-01 – 2011-11-02 (×2): 21 mg via TRANSDERMAL
  Filled 2011-10-31 (×5): qty 1

## 2011-10-31 MED ORDER — NICOTINE 21 MG/24HR TD PT24
21.0000 mg | MEDICATED_PATCH | Freq: Once | TRANSDERMAL | Status: AC
  Administered 2011-10-31: 21 mg via TRANSDERMAL
  Filled 2011-10-31: qty 1

## 2011-10-31 MED ORDER — MOXIFLOXACIN HCL 400 MG PO TABS
400.0000 mg | ORAL_TABLET | Freq: Every day | ORAL | Status: DC
  Administered 2011-10-31 – 2011-11-02 (×3): 400 mg via ORAL
  Filled 2011-10-31 (×5): qty 1

## 2011-10-31 NOTE — Progress Notes (Signed)
Subjective: Intermittently agitated, still needing sedation.  Objective: Vital signs in last 24 hours: Temp:  [97 F (36.1 C)-98.1 F (36.7 C)] 97.1 F (36.2 C) (01/07 0400) Pulse Rate:  [89-115] 106  (01/07 0315) Resp:  [20-39] 20  (01/07 0315) BP: (96-120)/(58-72) 120/72 mmHg (01/07 0315) SpO2:  [86 %-99 %] 94 % (01/07 0315) FiO2 (%):  [30 %-35 %] 30 % (01/06 1355) Weight change:  Last BM Date: 10/29/11  Intake/Output from previous day: 01/06 0701 - 01/07 0700 In: 1250 [P.O.:720; I.V.:230; IV Piggyback:300] Out: 1000 [Urine:1000]     Physical Exam: General: Not in acute respiratory distress, somnolent, but easily rousable, not short of breath at rest. HEENT:  No clinical pallor, no jaundice, no conjunctival injection or discharge. Hydration is satisfactory. NECK:  Supple, JVP not seen, no carotid bruits, no palpable lymphadenopathy, no palpable goiter. CHEST:  Few wheezes, no crackles. HEART:  Sounds 1 and 2 heard, normal, regular, no murmurs. ABDOMEN:  Flat, soft, non-tender. GENITALIA:  Not examined. LOWER EXTREMITIES:  No pitting edema, palpable peripheral pulses. MUSCULOSKELETAL SYSTEM:  Generalized osteoarthritic changes, otherwise, normal. CENTRAL NERVOUS SYSTEM:  No focal neurologic deficit on gross examination.  Lab Results:  Basename 10/31/11 0307 10/30/11 0817  WBC 20.6* 23.1*  HGB 14.6 14.7  HCT 44.0 43.3  PLT 249 253    Basename 10/31/11 0307 10/30/11 0817  NA 140 139  K 4.5 4.5  CL 106 106  CO2 26 27  GLUCOSE 119* 135*  BUN 21 18  CREATININE 0.71 0.69  CALCIUM 9.6 9.6   Recent Results (from the past 240 hour(s))  MRSA PCR SCREENING     Status: Normal   Collection Time   10/29/11  2:07 AM      Component Value Range Status Comment   MRSA by PCR NEGATIVE  NEGATIVE  Final      Studies/Results: No results found.  Medications: Scheduled Meds:    . albuterol  2.5 mg Nebulization Q6H  . ALPRAZolam  1 mg Oral TID  . azithromycin  500 mg  Intravenous Q24H  . cefTRIAXone (ROCEPHIN)  IV  1 g Intravenous Q24H  . enoxaparin  40 mg Subcutaneous Q24H  . fluticasone  2 spray Each Nare Daily  . guaiFENesin  600 mg Oral BID  . haloperidol lactate      . insulin aspart  0-5 Units Subcutaneous QHS  . insulin aspart  0-9 Units Subcutaneous TID WC  . pantoprazole (PROTONIX) IV  40 mg Intravenous Q1200  . predniSONE  40 mg Oral Q breakfast  . sodium chloride  3 mL Intravenous Q12H  . DISCONTD: albuterol  2.5 mg Nebulization Q4H  . DISCONTD: haloperidol lactate      . DISCONTD: methylPREDNISolone (SOLU-MEDROL) injection  80 mg Intravenous Q6H   Continuous Infusions:  PRN Meds:.sodium chloride, acetaminophen, acetaminophen, albuterol, guaiFENesin-codeine, haloperidol lactate, LORazepam, morphine injection, ondansetron (ZOFRAN) IV, ondansetron, sodium chloride  Assessment/Plan:  Principal Problem:  *Asthma exacerbation: This was acute, severe. Patient has clinically much improved, with decreased oxygen requirements, and no obvious respiratory distress. On Nebulizer, Mucinex and oral steroid taper.  *Active problems*  1. Acute Bronchitis: Evidenced by purulent phlegm, and CXR devoid of focal consolidation. This is the obvious culprit for infective exacerbation. On Rocephin/Azithromycin, day# 3. Will change antibiotics to Avelox monotherapy, to complete a total of 7 days treatment. 2. Psychiatric problems: Patient appears to be having an acute psychotic episode, with suicidal and homicidal thoughts, as well as evidence of depression. He is clearly  a danger to self, and perhaps to others. Per psychiatrist (Dr Ferol Luz), he is now on Zyprexa, as well as prn Haldol. Patient is involuntarily committed, and will be transferred to [facility], with clinical improvement.   Comment: Transfer to med-psych floor/continue 1:1 sitter.  Note: CBGs have been discontinued.    LOS: 3 days   [name] Hall,[name] Hall 10/31/2011, 8:18 AM

## 2011-10-31 NOTE — Progress Notes (Signed)
Remains on suicide precautions, withdrawn and sleeping in fetal position for most of shift, flat affect alternating with moderate verbal aggression toward staff. Agitated and c/o "being hot" despite fan and cool room temp at 0325am. 2mg  ativan iv given and jetneb per RT provided, sleeping quietly after admin. Sitter 1:1 provided per SP protocol.

## 2011-10-31 NOTE — Discharge Summary (Signed)
Physician Discharge Summary  Patient ID: [name] Hall MRN: [id number] DOB/AGE: [birth year] 26 y.o.  Admit date: 10/28/2011 Discharge date: 11/01/2011  Primary Care Physician:  No Pcp   Discharge Diagnoses:    Patient Active Problem List  Diagnoses  . Asthma exacerbation  . Psychosis    Current Discharge Medication List    CONTINUE these medications which have NOT CHANGED   Details  albuterol (PROVENTIL HFA;VENTOLIN HFA) 108 (90 BASE) MCG/ACT inhaler Inhale 2 puffs into the lungs every 6 (six) hours as needed. For shortness of breath    albuterol (PROVENTIL) (2.5 MG/3ML) 0.083% nebulizer solution Take 3 mLs (2.5 mg total) by nebulization every 6 (six) hours as needed for wheezing. Qty: 75 mL, Refills: 12    predniSONE (DELTASONE) 10 MG tablet 5,4,3,2,1 taper          Disposition and Follow-up:  To be determined after discharge from [facility].  Consults:  pulmonary/intensive care and psychiatry  Dr. Delford Hall, Pulmonary/Critical Care Medicine. Dr [name] Hall, Psychiatrist.  Significant Diagnostic Studies:  Dg Chest Port 1 View  10/28/2011  *RADIOLOGY REPORT*  Clinical Data: Asthma, shortness of breath, chest pain.  PORTABLE CHEST - 1 VIEW  Comparison: 10/28/2011  Findings: Hyperinflation is similar to prior. The peribronchial cuff appears slightly decreased.  No focal areas of consolidation. No pleural effusion or pneumothorax.  No acute osseous abnormality.  IMPRESSION: Hyperinflation with mild central peribronchial cuffing as can be seen with reactive airway disease.  No focal consolidation.  Original Report Authenticated By: [name] Hall, M.D.    Brief H and P: For complete details, refer to admission H and P. However, in brief, this is a 26 year old male, with known  history of asthma, presenting with a 2 day history of of acute shortness of breath, preceded by vague abdominal pain. Patient states that he collapsed at work due to respiratory difficulty, was  sent to [facility] where he was treated and discharged to home, but returned to [facility] ED, because of escalating symptomatology. He was admitted for further evaluation, investigation and management.   Physical Exam: On 11/01/2011. General: Not in acute respiratory distress, somnolent due to psychotopics and sedatives, but easily rousable, not short of breath at rest.  HEENT: No clinical pallor, no jaundice, no conjunctival injection or discharge. Hydration is satisfactory.  NECK: Supple, JVP not seen, no carotid bruits, no palpable lymphadenopathy, no palpable goiter.  CHEST: Few wheezes, no crackles.  HEART: Sounds 1 and 2 heard, normal, regular, no murmurs.  ABDOMEN: Flat, soft, non-tender.  GENITALIA: Not examined.  LOWER EXTREMITIES: No pitting edema, palpable peripheral pulses.  MUSCULOSKELETAL SYSTEM: Generalized osteoarthritic changes, otherwise, normal.  CENTRAL NERVOUS SYSTEM: No focal neurologic deficit on gross examination.   Hospital Course:  Principal Problem:  *Asthma exacerbation: This was acute, severe. Patient was initially managed in step-down unit, with Nebulizers, Mucinex and parenteral steroids. On 10/29/11, he had increased work of breathing and high oxygen requirements, raising concern for deterioration and acute respiratory failure. Dr [name] Hall, PCCM, was very helpful in directing treatment, and by 10/30/11, it was clear that patient had turned the corner, and respiratory status had significantly improved. He was subsequently transitioned to oral steroid taper, without deleterious effect. He was transferred out of SDU on 10/31/11. *Active problems*  1. Acute Bronchitis: This was evidenced by purulent phlegm production, and CXR was devoid of focal consolidation. This appears to be the obvious culprit for infective exacerbation of patient's asthma. He was initially managed with  Rocephin/Azithromycin, and by 10/31/11, as phlegm appearance had improved, and  production had decreased, he was transitioned to oral Avelox monotherapy, to complete a total of 7 days treatment.  2. Psychiatric problems: Patient on night of 10/29/11, and early morning of 10/30/11, became very agitated, combative, physically and verbally aggressive, expressing suicidal and homicidal thoughts. He had to be restrained, and on occasion, security personnel had to be called. He appears to be having an acute psychotic episode, with suicidal and homicidal thoughts, as well as evidence of depression. He is clearly a danger to self, and perhaps to others. Dr [name] Hall, psychiatrist, was consulted, and has recommended, Zyprexa, prn Haldol, benzodiazepines. Patient has been involuntarily committed, and will be transferred to [facility], in due course.    Comment: Patient is considered medically stable for transfer to [facility] on 11/01/11.  Time spent on Discharge: 35 mins.  Signed: Saturnino Hall,[name] Hall 11/01/2011, 6:22 PM

## 2011-10-31 NOTE — Consult Note (Signed)
Reason for Consult: Homicidal suicidal ideation. Referring Physician: Dr. Lovina Reach is an 26 y.o. male.  HPI: The patient has severe bronchial asthma.   Past Medical History  Diagnosis Date  . Asthma     History reviewed. No pertinent past surgical history.  History reviewed. No pertinent family history.  Social History:  reports that he has quit smoking. He does not have any smokeless tobacco history on file. He reports that he drinks alcohol. He reports that he uses illicit drugs (Marijuana).  Allergies:  Allergies  Allergen Reactions  . Uncoded Nonscreenable Allergen     Grass,trees, cats, & dogs: congestion    Medications: I have reviewed the patient's current medications.  Results for orders placed during the hospital encounter of 10/28/11 (from the past 48 hour(s))  GLUCOSE, CAPILLARY     Status: Abnormal   Collection Time   10/29/11  4:55 PM      Component Value Range Comment   Glucose-Capillary 127 (*) 70 - 99 (mg/dL)    Comment 1 Documented in Chart      Comment 2 Notify RN     GLUCOSE, CAPILLARY     Status: Abnormal   Collection Time   10/29/11  9:45 PM      Component Value Range Comment   Glucose-Capillary 129 (*) 70 - 99 (mg/dL)    Comment 1 Documented in Chart      Comment 2 Notify RN     CBC     Status: Abnormal   Collection Time   10/30/11  8:17 AM      Component Value Range Comment   WBC 23.1 (*) 4.0 - 10.5 (K/uL)    RBC 4.99  4.22 - 5.81 (MIL/uL)    Hemoglobin 14.7  13.0 - 17.0 (g/dL)    HCT 16.1  09.6 - 04.5 (%)    MCV 86.8  78.0 - 100.0 (fL)    MCH 29.5  26.0 - 34.0 (pg)    MCHC 33.9  30.0 - 36.0 (g/dL)    RDW 40.9  81.1 - 91.4 (%)    Platelets 253  150 - 400 (K/uL)   BASIC METABOLIC PANEL     Status: Abnormal   Collection Time   10/30/11  8:17 AM      Component Value Range Comment   Sodium 139  135 - 145 (mEq/L)    Potassium 4.5  3.5 - 5.1 (mEq/L)    Chloride 106  96 - 112 (mEq/L)    CO2 27  19 - 32 (mEq/L)    Glucose, Bld 135 (*)  70 - 99 (mg/dL)    BUN 18  6 - 23 (mg/dL)    Creatinine, Ser 7.82  0.50 - 1.35 (mg/dL)    Calcium 9.6  8.4 - 10.5 (mg/dL)    GFR calc non Af Amer >90  >90 (mL/min)    GFR calc Af Amer >90  >90 (mL/min)   GLUCOSE, CAPILLARY     Status: Abnormal   Collection Time   10/30/11  8:24 AM      Component Value Range Comment   Glucose-Capillary 145 (*) 70 - 99 (mg/dL)    Comment 1 Documented in Chart      Comment 2 Notify RN     GLUCOSE, CAPILLARY     Status: Abnormal   Collection Time   10/30/11 11:45 AM      Component Value Range Comment   Glucose-Capillary 131 (*) 70 - 99 (mg/dL)   GLUCOSE,  CAPILLARY     Status: Abnormal   Collection Time   10/30/11  4:20 PM      Component Value Range Comment   Glucose-Capillary 131 (*) 70 - 99 (mg/dL)    Comment 1 Documented in Chart      Comment 2 Notify RN     GLUCOSE, CAPILLARY     Status: Abnormal   Collection Time   10/30/11 10:02 PM      Component Value Range Comment   Glucose-Capillary 124 (*) 70 - 99 (mg/dL)    Comment 1 Documented in Chart      Comment 2 Notify RN     CBC     Status: Abnormal   Collection Time   10/31/11  3:07 AM      Component Value Range Comment   WBC 20.6 (*) 4.0 - 10.5 (K/uL)    RBC 4.99  4.22 - 5.81 (MIL/uL)    Hemoglobin 14.6  13.0 - 17.0 (g/dL)    HCT 96.0  45.4 - 09.8 (%)    MCV 88.2  78.0 - 100.0 (fL)    MCH 29.3  26.0 - 34.0 (pg)    MCHC 33.2  30.0 - 36.0 (g/dL)    RDW 11.9  14.7 - 82.9 (%)    Platelets 249  150 - 400 (K/uL)   BASIC METABOLIC PANEL     Status: Abnormal   Collection Time   10/31/11  3:07 AM      Component Value Range Comment   Sodium 140  135 - 145 (mEq/L)    Potassium 4.5  3.5 - 5.1 (mEq/L)    Chloride 106  96 - 112 (mEq/L)    CO2 26  19 - 32 (mEq/L)    Glucose, Bld 119 (*) 70 - 99 (mg/dL)    BUN 21  6 - 23 (mg/dL)    Creatinine, Ser 5.62  0.50 - 1.35 (mg/dL)    Calcium 9.6  8.4 - 10.5 (mg/dL)    GFR calc non Af Amer >90  >90 (mL/min)    GFR calc Af Amer >90  >90 (mL/min)   GLUCOSE,  CAPILLARY     Status: Abnormal   Collection Time   10/31/11  7:54 AM      Component Value Range Comment   Glucose-Capillary 112 (*) 70 - 99 (mg/dL)    Comment 1 Notify RN      Comment 2 Documented in Chart     GLUCOSE, CAPILLARY     Status: Abnormal   Collection Time   10/31/11 11:24 AM      Component Value Range Comment   Glucose-Capillary 114 (*) 70 - 99 (mg/dL)    Comment 1 Notify RN      Comment 2 Documented in Chart       No results found.  Review of Systems  Unable to perform ROS: acuity of condition   Blood pressure 129/64, pulse 110, temperature 97.3 F (36.3 C), temperature source Oral, resp. rate 20, height 5\' 11"  (1.803 m), weight 60.7 kg (133 lb 13.1 oz), SpO2 91.00%. Physical Exam  Assessment/Plan: Patient is seen with his mother, Clydie Braun. He is reading with nasal cannula and labored respiratory  chest wall movements are noted. He speaks between between breaths. Headache knowledge is that he weighs suicidal and homicidal in the first few hours of admission. He says his last year of living was "living hell." He agrees that A. transferred to [facility] will be beneficial for him to receive medication for the  first time for his bipolar condition and also received therapy with some discharge plans that will enable him to have a place to live and a goal toward enriching his life.  RECOMMENDATION: 1 continue IVC with plan to transfer to [facility].                                                                                                                                                                                                                                                                                                                                                                                                                                                                                                                                               Hasina Kreager 10/31/2011, 4:30 PM

## 2011-10-31 NOTE — Progress Notes (Addendum)
Name: [name] Hall MRN: [id number] DOB: 1986-09-25    LOS: 3  PCCM PROGRESS NOTE  History of Present Illness: This is a 26 year old white male with status asthmaticus and now recognized Bipolar disorder with suicidal ideation and verbal/physical threats.    Lines / Drains: none  Cultures: none  Antibiotics: Rocephin 1/4 zmax 1/4  Tests / Events:    Subjective: Pt much better today, much calmer on lower pred dose   Vital Signs: Temp:  [97 F (36.1 C)-97.6 F (36.4 C)] 97.1 F (36.2 C) (01/07 0400) Pulse Rate:  [89-115] 106  (01/07 0315) Resp:  [20-39] 20  (01/07 0315) BP: (96-120)/(58-72) 120/72 mmHg (01/07 0315) SpO2:  [86 %-99 %] 94 % (01/07 0315) FiO2 (%):  [30 %] 30 % (01/06 1355) I/O last 3 completed shifts: In: 1260 [P.O.:720; I.V.:240; IV Piggyback:300] Out: 1000 [Urine:1000]  Physical Examination: General:  Less anxious with geodon.   Neuro:  alert moves all fours   HEENT:  Dry mucous membranes, nasal rhinitis Neck:  Neck supple no thyromegaly   Cardiovascular:  Tachycardia, normal S1-S2 no S3-S4  no murmurs rubs or gallops Lungs:  Improved airflow Abdomen:  Soft nontender bowel sounds active Musculoskeletal:  Full range of motion no joint deformity  Skin:  Clear   Labs and Imaging:  Reviewed.  Please refer to the Assessment and Plan section for relevant results.  No results found.   BMET    Component Value Date/Time   NA 140 10/31/2011 0307   K 4.5 10/31/2011 0307   CL 106 10/31/2011 0307   CO2 26 10/31/2011 0307   GLUCOSE 119* 10/31/2011 0307   BUN 21 10/31/2011 0307   CREATININE 0.71 10/31/2011 0307   CALCIUM 9.6 10/31/2011 0307   GFRNONAA >90 10/31/2011 0307   GFRAA >90 10/31/2011 0307   CBC    Component Value Date/Time   WBC 20.6* 10/31/2011 0307   RBC 4.99 10/31/2011 0307   HGB 14.6 10/31/2011 0307   HCT 44.0 10/31/2011 0307   PLT 249 10/31/2011 0307   MCV 88.2 10/31/2011 0307   MCH 29.3 10/31/2011 0307   MCHC 33.2 10/31/2011 0307   RDW 13.3 10/31/2011 0307     LYMPHSABS 0.4* 10/28/2011 2239   MONOABS 0.2 10/28/2011 2239   EOSABS 0.0 10/28/2011 2239   BASOSABS 0.0 10/28/2011 2239        Assessment and Plan: 1. status asthmaticus with acute respiratory failure Plan Agree with Rx as outlined by hospitalist service Steroid dosing may be likely exacerbating psychosis PCCM will see as needed Once pt is out of behavioural health, pulmonary should f/u as outpt Titrate steroids down Flutter valve   Shan Levans  M.D. Pulmonary and Critical Care Medicine [facility] Cell: [phone]   Pager: [phone] 10/31/2011, 12:31 PM

## 2011-11-01 LAB — CBC
HCT: 44.7 % (ref 39.0–52.0)
Hemoglobin: 15 g/dL (ref 13.0–17.0)
MCH: 29.1 pg (ref 26.0–34.0)
MCV: 86.8 fL (ref 78.0–100.0)
Platelets: 229 10*3/uL (ref 150–400)
RBC: 5.15 MIL/uL (ref 4.22–5.81)
WBC: 10.9 10*3/uL — ABNORMAL HIGH (ref 4.0–10.5)

## 2011-11-01 LAB — BASIC METABOLIC PANEL
CO2: 27 mEq/L (ref 19–32)
Calcium: 9.6 mg/dL (ref 8.4–10.5)
Chloride: 103 mEq/L (ref 96–112)
Creatinine, Ser: 0.74 mg/dL (ref 0.50–1.35)
Glucose, Bld: 83 mg/dL (ref 70–99)

## 2011-11-01 MED ORDER — PREDNISONE 20 MG PO TABS
30.0000 mg | ORAL_TABLET | Freq: Every day | ORAL | Status: DC
  Administered 2011-11-02 – 2011-11-04 (×3): 30 mg via ORAL
  Filled 2011-11-01 (×3): qty 1

## 2011-11-01 NOTE — Consult Note (Signed)
Reason for Consult:Suicidal, homicidal threats Referring Physician: Dr. Lovina Reach is an 26 y.o. male.  HPI: Acute Asthma severe, partially resolved.   Past Medical History  Diagnosis Date  . Asthma     History reviewed. No pertinent past surgical history.  History reviewed. No pertinent family history.  Social History:  reports that he has quit smoking. He does not have any smokeless tobacco history on file. He reports that he drinks alcohol. He reports that he uses illicit drugs (Marijuana).  Allergies:  Allergies  Allergen Reactions  . Uncoded Nonscreenable Allergen     Grass,trees, cats, & dogs: congestion    Medications: I have reviewed the patient's current medications.  Results for orders placed during the hospital encounter of 10/28/11 (from the past 48 hour(s))  GLUCOSE, CAPILLARY     Status: Abnormal   Collection Time   10/30/11  4:20 PM      Component Value Range Comment   Glucose-Capillary 131 (*) 70 - 99 (mg/dL)    Comment 1 Documented in Chart      Comment 2 Notify RN     GLUCOSE, CAPILLARY     Status: Abnormal   Collection Time   10/30/11 10:02 PM      Component Value Range Comment   Glucose-Capillary 124 (*) 70 - 99 (mg/dL)    Comment 1 Documented in Chart      Comment 2 Notify RN     CBC     Status: Abnormal   Collection Time   10/31/11  3:07 AM      Component Value Range Comment   WBC 20.6 (*) 4.0 - 10.5 (K/uL)    RBC 4.99  4.22 - 5.81 (MIL/uL)    Hemoglobin 14.6  13.0 - 17.0 (g/dL)    HCT 96.0  45.4 - 09.8 (%)    MCV 88.2  78.0 - 100.0 (fL)    MCH 29.3  26.0 - 34.0 (pg)    MCHC 33.2  30.0 - 36.0 (g/dL)    RDW 11.9  14.7 - 82.9 (%)    Platelets 249  150 - 400 (K/uL)   BASIC METABOLIC PANEL     Status: Abnormal   Collection Time   10/31/11  3:07 AM      Component Value Range Comment   Sodium 140  135 - 145 (mEq/L)    Potassium 4.5  3.5 - 5.1 (mEq/L)    Chloride 106  96 - 112 (mEq/L)    CO2 26  19 - 32 (mEq/L)    Glucose, Bld 119 (*) 70  - 99 (mg/dL)    BUN 21  6 - 23 (mg/dL)    Creatinine, Ser 5.62  0.50 - 1.35 (mg/dL)    Calcium 9.6  8.4 - 10.5 (mg/dL)    GFR calc non Af Amer >90  >90 (mL/min)    GFR calc Af Amer >90  >90 (mL/min)   GLUCOSE, CAPILLARY     Status: Abnormal   Collection Time   10/31/11  7:54 AM      Component Value Range Comment   Glucose-Capillary 112 (*) 70 - 99 (mg/dL)    Comment 1 Notify RN      Comment 2 Documented in Chart     GLUCOSE, CAPILLARY     Status: Abnormal   Collection Time   10/31/11 11:24 AM      Component Value Range Comment   Glucose-Capillary 114 (*) 70 - 99 (mg/dL)    Comment 1 Notify  RN      Comment 2 Documented in Chart     CULTURE, RESPIRATORY     Status: Normal (Preliminary result)   Collection Time   10/31/11  1:08 PM      Component Value Range Comment   Specimen Description SPUTUM      Special Requests NONE      Gram Stain        Value: NO WBC SEEN     RARE SQUAMOUS EPITHELIAL CELLS PRESENT     NO ORGANISMS SEEN   Culture NO GROWTH      Report Status PENDING     URINE RAPID DRUG SCREEN (HOSP PERFORMED)     Status: Abnormal   Collection Time   10/31/11  7:32 PM      Component Value Range Comment   Opiates POSITIVE (*) NONE DETECTED     Cocaine NONE DETECTED  NONE DETECTED     Benzodiazepines POSITIVE (*) NONE DETECTED     Amphetamines NONE DETECTED  NONE DETECTED     Tetrahydrocannabinol POSITIVE (*) NONE DETECTED     Barbiturates NONE DETECTED  NONE DETECTED    CBC     Status: Abnormal   Collection Time   11/01/11  5:10 AM      Component Value Range Comment   WBC 10.9 (*) 4.0 - 10.5 (K/uL)    RBC 5.15  4.22 - 5.81 (MIL/uL)    Hemoglobin 15.0  13.0 - 17.0 (g/dL)    HCT 96.2  95.2 - 84.1 (%)    MCV 86.8  78.0 - 100.0 (fL)    MCH 29.1  26.0 - 34.0 (pg)    MCHC 33.6  30.0 - 36.0 (g/dL)    RDW 32.4  40.1 - 02.7 (%)    Platelets 229  150 - 400 (K/uL)   BASIC METABOLIC PANEL     Status: Normal   Collection Time   11/01/11  5:10 AM      Component Value Range Comment     Sodium 139  135 - 145 (mEq/L)    Potassium 3.8  3.5 - 5.1 (mEq/L)    Chloride 103  96 - 112 (mEq/L)    CO2 27  19 - 32 (mEq/L)    Glucose, Bld 83  70 - 99 (mg/dL)    BUN 21  6 - 23 (mg/dL)    Creatinine, Ser 2.53  0.50 - 1.35 (mg/dL)    Calcium 9.6  8.4 - 10.5 (mg/dL)    GFR calc non Af Amer >90  >90 (mL/min)    GFR calc Af Amer >90  >90 (mL/min)     No results found.  Review of Systems  Unable to perform ROS: medical condition   Blood pressure 108/72, pulse 87, temperature 97.4 F (36.3 C), temperature source Oral, resp. rate 20, height 5\' 11"  (1.803 m), weight 60.7 kg (133 lb 13.1 oz), SpO2 97.00%. Physical Exam  Assessment/Plan: Pt is propped up today; mother and police officer present.  Interview by officer concerns person of interest; not patient.  He says he is feeling better today.  He is calm, has good eye contact and has better mood and congruent affect.  He has minimal visible effort when breathing.  He has an elaborate tattoo over L. Shoulder  He is smiling and denies and suicidal/homicidal thoughts today.  He agrees to transfer to [facility]. RECOMMENDATION:.   1.Continue IVC 2. Transfer to [facility] when medically stable 3.Plan has been discussed with Mother and with Dr.  Marquette Saa, Adrion Menz 11/01/2011, 2:46 PM

## 2011-11-01 NOTE — Progress Notes (Signed)
Convinced patient to wear the o2 if he intends to improve. Patient remains very unmotivated and I let him know we typically do not q2 hr. treatments on the floors. Will assess later. Coughing up a large amount of white mucous.

## 2011-11-01 NOTE — Progress Notes (Signed)
Spoke at length with Pt and mom.  Pt just received news that a close friend is being sent back to [place] to serve time for approx 2 years.  Pt tearful about this loss.    Discussed [facility]'s bed status with Pt and answered questions re: [facility].  Pt agreeable to inpt tx and to med mgmt.  Per mom, Pt is homeless but will have a supportive place to stay for several wks upon d/c from [facility].  Pt has prospective employment, as well.  Pt was calm and cooperative during interview.  CSW to continue to follow.  Providence Crosby, LCSWA Clinical Social Work [phone]

## 2011-11-01 NOTE — Progress Notes (Signed)
Subjective: Calm today. No new issues.  Objective: Vital signs in last 24 hours: Temp:  [97.3 F (36.3 C)-97.7 F (36.5 C)] 97.4 F (36.3 C) (01/08 0601) Pulse Rate:  [83-110] 87  (01/08 0601) Resp:  [18-20] 20  (01/08 0601) BP: (96-129)/(47-72) 108/72 mmHg (01/08 0601) SpO2:  [90 %-98 %] 97 % (01/08 0856) Weight:  [60.7 kg (133 lb 13.1 oz)] 133 lb 13.1 oz (60.7 kg) (01/07 1347) Weight change:  Last BM Date: 10/29/11  Intake/Output from previous day: 01/07 0701 - 01/08 0700 In: 120 [I.V.:120] Out: -      Physical Exam: General: Not in acute respiratory distress, somnolent, but easily rousable, not short of breath at rest. HEENT:  No clinical pallor, no jaundice, no conjunctival injection or discharge. Hydration is satisfactory. NECK:  Supple, JVP not seen, no carotid bruits, no palpable lymphadenopathy, no palpable goiter. CHEST:  Few wheezes, no crackles. HEART:  Sounds 1 and 2 heard, normal, regular, no murmurs. ABDOMEN:  Flat, soft, non-tender. GENITALIA:  Not examined. LOWER EXTREMITIES:  No pitting edema, palpable peripheral pulses. MUSCULOSKELETAL SYSTEM:  Generalized osteoarthritic changes, otherwise, normal. CENTRAL NERVOUS SYSTEM:  No focal neurologic deficit on gross examination.  Lab Results:  Basename 11/01/11 0510 10/31/11 0307  WBC 10.9* 20.6*  HGB 15.0 14.6  HCT 44.7 44.0  PLT 229 249    Basename 11/01/11 0510 10/31/11 0307  NA 139 140  K 3.8 4.5  CL 103 106  CO2 27 26  GLUCOSE 83 119*  BUN 21 21  CREATININE 0.74 0.71  CALCIUM 9.6 9.6   Recent Results (from the past 240 hour(s))  MRSA PCR SCREENING     Status: Normal   Collection Time   10/29/11  2:07 AM      Component Value Range Status Comment   MRSA by PCR NEGATIVE  NEGATIVE  Final   CULTURE, RESPIRATORY     Status: Normal (Preliminary result)   Collection Time   10/31/11  1:08 PM      Component Value Range Status Comment   Specimen Description SPUTUM   Final    Special Requests NONE    Final    Gram Stain     Final    Value: NO WBC SEEN     RARE SQUAMOUS EPITHELIAL CELLS PRESENT     NO ORGANISMS SEEN   Culture NO GROWTH   Final    Report Status PENDING   Incomplete      Studies/Results: No results found.  Medications: Scheduled Meds:    . albuterol  2.5 mg Nebulization Q6H  . ALPRAZolam  1 mg Oral TID  . enoxaparin  40 mg Subcutaneous Q24H  . fluticasone  2 spray Each Nare Daily  . guaiFENesin  600 mg Oral BID  . moxifloxacin  400 mg Oral q1800  . nicotine  21 mg Transdermal Daily  . nicotine  21 mg Transdermal Once  . predniSONE  40 mg Oral Q breakfast  . sodium chloride  3 mL Intravenous Q12H  . DISCONTD: insulin aspart  0-5 Units Subcutaneous QHS  . DISCONTD: insulin aspart  0-9 Units Subcutaneous TID WC  . DISCONTD: pantoprazole (PROTONIX) IV  40 mg Intravenous Q1200   Continuous Infusions:  PRN Meds:.acetaminophen, acetaminophen, albuterol, haloperidol lactate, LORazepam, ondansetron (ZOFRAN) IV, ondansetron, sodium chloride, DISCONTD: sodium chloride, DISCONTD: guaiFENesin-codeine, DISCONTD:  morphine injection  Assessment/Plan:  Principal Problem:  *Asthma exacerbation: This was acute, severe. Patient has clinically much improved. On Nebulizer, Mucinex and oral steroid taper.  *  Active problems*  1. Acute Bronchitis: Evidenced by purulent phlegm, and CXR devoid of focal consolidation. This is the obvious culprit for infective exacerbation. Now on Avelox monotherapy, day#2. To complete a total of 7 days treatment. 2. Psychiatric problems: Patient appears to be having an acute psychotic episode, with suicidal and homicidal thoughts, as well as evidence of depression. He is clearly a danger to self, and perhaps to others. Per psychiatrist (Dr Ferol Luz), he is now on Zyprexa, as well as prn Haldol. Patient is involuntarily committed, and will be transferred to [facility] in due course..   Comment: Patient is medically stable today, for transfer to [facility]. Per Dr  Delford Field, will need pulmonary follow up, on discharge from [facility].   LOS: 4 days   [name] Hall,[name] Hall 11/01/2011, 11:44 AM

## 2011-11-01 NOTE — Progress Notes (Signed)
[facility] unsure of bed status, at this time. Alice at [facility] to contact CSW when bed available.    Providence Crosby, LCSWA  Clinical Social Work  [phone]

## 2011-11-01 NOTE — Progress Notes (Signed)
Patient refuses to wear oxygen or to use flutter

## 2011-11-02 LAB — BASIC METABOLIC PANEL
BUN: 18 mg/dL (ref 6–23)
CO2: 23 mEq/L (ref 19–32)
Calcium: 9.4 mg/dL (ref 8.4–10.5)
Glucose, Bld: 85 mg/dL (ref 70–99)
Sodium: 139 mEq/L (ref 135–145)

## 2011-11-02 LAB — CBC
Hemoglobin: 16.6 g/dL (ref 13.0–17.0)
MCH: 29.2 pg (ref 26.0–34.0)
MCHC: 34.4 g/dL (ref 30.0–36.0)
MCV: 84.7 fL (ref 78.0–100.0)
Platelets: 238 10*3/uL (ref 150–400)
RBC: 5.69 MIL/uL (ref 4.22–5.81)

## 2011-11-02 MED ORDER — FLUTICASONE-SALMETEROL 100-50 MCG/DOSE IN AEPB
1.0000 | INHALATION_SPRAY | Freq: Two times a day (BID) | RESPIRATORY_TRACT | Status: DC
  Administered 2011-11-02 – 2011-11-03 (×2): 1 via RESPIRATORY_TRACT
  Filled 2011-11-02: qty 14

## 2011-11-02 MED ORDER — IPRATROPIUM BROMIDE 0.02 % IN SOLN
0.5000 mg | Freq: Four times a day (QID) | RESPIRATORY_TRACT | Status: DC
  Administered 2011-11-02 – 2011-11-03 (×2): 0.5 mg via RESPIRATORY_TRACT
  Filled 2011-11-02 (×3): qty 2.5

## 2011-11-02 NOTE — Progress Notes (Signed)
Pt refuses chest PT at this time. Informed pt it was ordered Q4 and would help. Pt still refused

## 2011-11-02 NOTE — Progress Notes (Signed)
Patient ID: [name] Hall, male   DOB: 1986-05-07, 26 y.o.   MRN: [id number] Subjective: Patient seen.Feels better. Denies any chest pain or shortness of breath. Patient denies any homicidal or suicidal ideations.  Objective: Vital signs in last 24 hours: Temp:  [97.6 F (36.4 C)-98.2 F (36.8 C)] 98.2 F (36.8 C) (01/09 1359) Pulse Rate:  [64-113] 113  (01/09 1359) Resp:  [20-23] 23  (01/09 1359) BP: (92-110)/(52-78) 110/78 mmHg (01/09 1359) SpO2:  [89 %-95 %] 93 % (01/09 1500) FiO2 (%):  [21 %] 21 % (01/08 2050) Weight change:  Last BM Date: 11/02/11  Intake/Output from previous day: 01/08 0701 - 01/09 0700 In: 240 [P.O.:240] Out: -  Total I/O In: 483 [P.O.:480; I.V.:3] Out: 375 [Urine:375]   Physical Exam: General: Not in acute respiratory distress, alert and awake HEENT:  No clinical pallor, no jaundice, no conjunctival injection or discharge. Hydration is satisfactory. NECK:  Supple, JVP not seen, no carotid bruits, no palpable lymphadenopathy, no palpable goiter. CHEST:  Minimally scattered rhonchi, no rales HEART:  Sounds 1 and 2 heard, normal, regular, no murmurs. ABDOMEN:  Flat, soft, non-tender. GENITALIA:  Not examined. LOWER EXTREMITIES:  No pitting edema, palpable peripheral pulses. MUSCULOSKELETAL SYSTEM:  normal CENTRAL NERVOUS SYSTEM:  Non focal. Neuropsych-appropriate affect.  Lab Results:  Basename 11/02/11 0516 11/01/11 0510  WBC 10.6* 10.9*  HGB 16.6 15.0  HCT 48.2 44.7  PLT 238 229    Basename 11/02/11 0516 11/01/11 0510  NA 139 139  K 3.5 3.8  CL 103 103  CO2 23 27  GLUCOSE 85 83  BUN 18 21  CREATININE 0.74 0.74  CALCIUM 9.4 9.6   Recent Results (from the past 240 hour(s))  MRSA PCR SCREENING     Status: Normal   Collection Time   10/29/11  2:07 AM      Component Value Range Status Comment   MRSA by PCR NEGATIVE  NEGATIVE  Final   CULTURE, RESPIRATORY     Status: Normal (Preliminary result)   Collection Time   10/31/11  1:08 PM   Component Value Range Status Comment   Specimen Description SPUTUM   Final    Special Requests NONE   Final    Gram Stain     Final    Value: NO WBC SEEN     RARE SQUAMOUS EPITHELIAL CELLS PRESENT     NO ORGANISMS SEEN   Culture NORMAL OROPHARYNGEAL FLORA   Final    Report Status PENDING   Incomplete      Studies/Results: No results found.  Medications: Scheduled Meds:    . albuterol  2.5 mg Nebulization Q6H  . ALPRAZolam  1 mg Oral TID  . enoxaparin  40 mg Subcutaneous Q24H  . fluticasone  2 spray Each Nare Daily  . guaiFENesin  600 mg Oral BID  . ipratropium  0.5 mg Nebulization Q6H  . moxifloxacin  400 mg Oral q1800  . nicotine  21 mg Transdermal Daily  . nicotine  21 mg Transdermal Once  . predniSONE  30 mg Oral Q breakfast  . sodium chloride  3 mL Intravenous Q12H   Continuous Infusions:  PRN Meds:.acetaminophen, acetaminophen, albuterol, haloperidol lactate, LORazepam, ondansetron (ZOFRAN) IV, ondansetron, sodium chloride  Assessment/Plan:  Principal Problem:  #1 Asthma exacerbation: Patient is much improved. Denies any chest pain or shortness of breath. Plan is to continue nebulizer treatment, Avelox as well as steroid. Will follow up with  Dr. Ashok Croon on discharge #2 Bipolar affective disorder  with homicidal and suicidal ideation on presentation-plan is to continue Haldol for now and await availability of bed at [facility] before being transferred   LOS: 5 days   Selicia Windom 11/02/2011, 5:39 PM

## 2011-11-03 ENCOUNTER — Inpatient Hospital Stay (HOSPITAL_COMMUNITY): Payer: Self-pay

## 2011-11-03 LAB — COMPREHENSIVE METABOLIC PANEL
ALT: 12 U/L (ref 0–53)
AST: 10 U/L (ref 0–37)
Albumin: 3.8 g/dL (ref 3.5–5.2)
Calcium: 9.4 mg/dL (ref 8.4–10.5)
Creatinine, Ser: 0.88 mg/dL (ref 0.50–1.35)
Sodium: 138 mEq/L (ref 135–145)

## 2011-11-03 LAB — CBC
MCH: 29.7 pg (ref 26.0–34.0)
MCV: 84.8 fL (ref 78.0–100.0)
Platelets: 270 10*3/uL (ref 150–400)
RBC: 5.86 MIL/uL — ABNORMAL HIGH (ref 4.22–5.81)
RDW: 12.1 % (ref 11.5–15.5)
WBC: 11.7 10*3/uL — ABNORMAL HIGH (ref 4.0–10.5)

## 2011-11-03 LAB — DIFFERENTIAL
Basophils Absolute: 0 10*3/uL (ref 0.0–0.1)
Eosinophils Relative: 8 % — ABNORMAL HIGH (ref 0–5)
Lymphs Abs: 4.4 10*3/uL — ABNORMAL HIGH (ref 0.7–4.0)
Monocytes Absolute: 0.9 10*3/uL (ref 0.1–1.0)
Monocytes Relative: 8 % (ref 3–12)
Neutrophils Relative %: 46 % (ref 43–77)

## 2011-11-03 MED ORDER — ALPRAZOLAM 1 MG PO TABS
1.0000 mg | ORAL_TABLET | Freq: Four times a day (QID) | ORAL | Status: DC
  Administered 2011-11-03 – 2011-11-04 (×2): 1 mg via ORAL
  Filled 2011-11-03 (×2): qty 1

## 2011-11-03 NOTE — Progress Notes (Signed)
Patient ID: MICIAH COVELLI, male   DOB: October 22, 1986, 26 y.o.   MRN: [id number] Patient ID: HAMZAH SAVOCA, male   DOB: 1986/06/28, 26 y.o.   MRN: [id number] Subjective: Patient seen.Feels better. Awaiting [facility] placement  Objective: Vital signs in last 24 hours: Temp:  [97.5 F (36.4 C)-97.7 F (36.5 C)] 97.6 F (36.4 C) (01/10 1423) Pulse Rate:  [76-110] 101  (01/10 1423) Resp:  [16-18] 16  (01/10 0513) BP: (110-119)/(73-80) 114/80 mmHg (01/10 1423) SpO2:  [91 %-95 %] 91 % (01/10 1423) Weight change:  Last BM Date: 11/02/11  Intake/Output from previous day: 01/09 0701 - 01/10 0700 In: 483 [P.O.:480; I.V.:3] Out: 375 [Urine:375] Total I/O In: 480 [P.O.:480] Out: -    Physical Exam: General: Not in acute respiratory distress, alert and awake HEENT:  No clinical pallor, no jaundice, no conjunctival injection or discharge. Hydration is satisfactory. NECK:  Supple, JVP not seen, no carotid bruits, no palpable lymphadenopathy, no palpable goiter. CHEST:  clear HEART:  Sounds 1 and 2 heard, normal, regular, no murmurs. ABDOMEN:  Flat, soft, non-tender. GENITALIA:  Not examined. LOWER EXTREMITIES:  No pitting edema, palpable peripheral pulses. MUSCULOSKELETAL SYSTEM:  normal CENTRAL NERVOUS SYSTEM:  Non focal. Neuropsych-appropriate affect.  Lab Results:  Basename 11/03/11 0515 11/02/11 0516  WBC 11.7* 10.6*  HGB 17.4* 16.6  HCT 49.7 48.2  PLT 270 238    Basename 11/03/11 0515 11/02/11 0516  NA 138 139  K 3.6 3.5  CL 101 103  CO2 26 23  GLUCOSE 78 85  BUN 19 18  CREATININE 0.88 0.74  CALCIUM 9.4 9.4   Recent Results (from the past 240 hour(s))  MRSA PCR SCREENING     Status: Normal   Collection Time   10/29/11  2:07 AM      Component Value Range Status Comment   MRSA by PCR NEGATIVE  NEGATIVE  Final   CULTURE, RESPIRATORY     Status: Normal   Collection Time   10/31/11  1:08 PM      Component Value Range Status Comment   Specimen Description SPUTUM   Final    Special Requests NONE   Final    Gram Stain     Final    Value: NO WBC SEEN     RARE SQUAMOUS EPITHELIAL CELLS PRESENT     NO ORGANISMS SEEN   Culture NORMAL OROPHARYNGEAL FLORA   Final    Report Status 11/03/2011 FINAL   Final      Studies/Results: Dg Chest Port 1 View  11/03/2011  *RADIOLOGY REPORT*  Clinical Data: Asthma  PORTABLE CHEST - 1 VIEW  Comparison: 10/28/2011  Findings: The lungs are essentially clear.  Possible mild hyperinflation. No pleural effusion or pneumothorax.  Cardiomediastinal silhouette is within normal limits.  IMPRESSION: No evidence of acute cardiopulmonary disease.  Possible mild hyperinflation.  Original Report Authenticated By: Charline Bills, M.D.    Medications: Scheduled Meds:    . albuterol  2.5 mg Nebulization Q6H  . ALPRAZolam  1 mg Oral TID  . enoxaparin  40 mg Subcutaneous Q24H  . fluticasone  2 spray Each Nare Daily  . Fluticasone-Salmeterol  1 puff Inhalation BID  . guaiFENesin  600 mg Oral BID  . ipratropium  0.5 mg Nebulization Q6H  . moxifloxacin  400 mg Oral q1800  . nicotine  21 mg Transdermal Daily  . predniSONE  30 mg Oral Q breakfast  . sodium chloride  3 mL Intravenous Q12H   Continuous Infusions:  PRN Meds:.acetaminophen, acetaminophen, albuterol, haloperidol lactate, LORazepam, ondansetron (ZOFRAN) IV, ondansetron, sodium chloride  Assessment/Plan:  Principal Problem:  #1 Asthma exacerbation: improved clinically.Will continue present treatment regimen #2 Bipolar affective disorder with homicidal and suicidal ideation on presentation- continue Haldol for now and await availability of bed at [facility] before being transferred   LOS: 6 days   Gayatri Teasdale 11/03/2011, 4:46 PM

## 2011-11-03 NOTE — Consult Note (Signed)
Reason for Consult:SI/HI Referring Physician: Dr. Lovina Reach is an 26 y.o. male.  HPI: Pt referred from [facility] in acute asthmatic distressed.  He became HI/SI and aggressive. Police called; now stabilized with better respiratory control.  Chronic history of Bipolar DO; never treated; mother signed IVC in hospital  Past Medical History  Diagnosis Date  . Asthma     History reviewed. No pertinent past surgical history.  History reviewed. No pertinent family history.  Social History:  reports that he has quit smoking. He does not have any smokeless tobacco history on file. He reports that he drinks alcohol. He reports that he uses illicit drugs (Marijuana).  Allergies:  Allergies  Allergen Reactions  . Uncoded Nonscreenable Allergen     Grass,trees, cats, & dogs: congestion    MedicationsI hav reviewed patient's medications   Results for orders placed during the hospital encounter of 10/28/11 (from the past 48 hour(s))  BASIC METABOLIC PANEL     Status: Normal   Collection Time   11/02/11  5:16 AM      Component Value Range Comment   Sodium 139  135 - 145 (mEq/L)    Potassium 3.5  3.5 - 5.1 (mEq/L)    Chloride 103  96 - 112 (mEq/L)    CO2 23  19 - 32 (mEq/L)    Glucose, Bld 85  70 - 99 (mg/dL)    BUN 18  6 - 23 (mg/dL)    Creatinine, Ser 2.95  0.50 - 1.35 (mg/dL)    Calcium 9.4  8.4 - 10.5 (mg/dL)    GFR calc non Af Amer >90  >90 (mL/min)    GFR calc Af Amer >90  >90 (mL/min)   CBC     Status: Abnormal   Collection Time   11/02/11  5:16 AM      Component Value Range Comment   WBC 10.6 (*) 4.0 - 10.5 (K/uL)    RBC 5.69  4.22 - 5.81 (MIL/uL)    Hemoglobin 16.6  13.0 - 17.0 (g/dL)    HCT 62.1  30.8 - 65.7 (%)    MCV 84.7  78.0 - 100.0 (fL)    MCH 29.2  26.0 - 34.0 (pg)    MCHC 34.4  30.0 - 36.0 (g/dL)    RDW 84.6  96.2 - 95.2 (%)    Platelets 238  150 - 400 (K/uL)   CBC     Status: Abnormal   Collection Time   11/03/11  5:15 AM      Component Value Range  Comment   WBC 11.7 (*) 4.0 - 10.5 (K/uL)    RBC 5.86 (*) 4.22 - 5.81 (MIL/uL)    Hemoglobin 17.4 (*) 13.0 - 17.0 (g/dL)    HCT 84.1  32.4 - 40.1 (%)    MCV 84.8  78.0 - 100.0 (fL)    MCH 29.7  26.0 - 34.0 (pg)    MCHC 35.0  30.0 - 36.0 (g/dL)    RDW 02.7  25.3 - 66.4 (%)    Platelets 270  150 - 400 (K/uL)   DIFFERENTIAL     Status: Abnormal   Collection Time   11/03/11  5:15 AM      Component Value Range Comment   Neutrophils Relative 46  43 - 77 (%)    Lymphocytes Relative 38  12 - 46 (%)    Monocytes Relative 8  3 - 12 (%)    Eosinophils Relative 8 (*) 0 -  5 (%)    Basophils Relative 0  0 - 1 (%)    Neutro Abs 5.5  1.7 - 7.7 (K/uL)    Lymphs Abs 4.4 (*) 0.7 - 4.0 (K/uL)    Monocytes Absolute 0.9  0.1 - 1.0 (K/uL)    Eosinophils Absolute 0.9 (*) 0.0 - 0.7 (K/uL)    Basophils Absolute 0.0  0.0 - 0.1 (K/uL)    Smear Review MORPHOLOGY UNREMARKABLE     COMPREHENSIVE METABOLIC PANEL     Status: Normal   Collection Time   11/03/11  5:15 AM      Component Value Range Comment   Sodium 138  135 - 145 (mEq/L)    Potassium 3.6  3.5 - 5.1 (mEq/L)    Chloride 101  96 - 112 (mEq/L)    CO2 26  19 - 32 (mEq/L)    Glucose, Bld 78  70 - 99 (mg/dL)    BUN 19  6 - 23 (mg/dL)    Creatinine, Ser 4.09  0.50 - 1.35 (mg/dL)    Calcium 9.4  8.4 - 10.5 (mg/dL)    Total Protein 6.9  6.0 - 8.3 (g/dL)    Albumin 3.8  3.5 - 5.2 (g/dL)    AST 10  0 - 37 (U/L)    ALT 12  0 - 53 (U/L)    Alkaline Phosphatase 59  39 - 117 (U/L)    Total Bilirubin 0.4  0.3 - 1.2 (mg/dL)    GFR calc non Af Amer >90  >90 (mL/min)    GFR calc Af Amer >90  >90 (mL/min)     Dg Chest Port 1 View  11/03/2011  *RADIOLOGY REPORT*  Clinical Data: Asthma  PORTABLE CHEST - 1 VIEW  Comparison: 10/28/2011  Findings: The lungs are essentially clear.  Possible mild hyperinflation. No pleural effusion or pneumothorax.  Cardiomediastinal silhouette is within normal limits.  IMPRESSION: No evidence of acute cardiopulmonary disease.   Possible mild hyperinflation.  Original Report Authenticated By: Charline Bills, M.D.    Review of Systems  Unable to perform ROS: medical condition   Blood pressure 108/67, pulse 101, temperature 98.5 F (36.9 C), temperature source Oral, resp. rate 18, height 5\' 11"  (1.803 m), weight 60.7 kg (133 lb 13.1 oz), SpO2 97.00%. Physical Exam  Assessment/Plan: Discussed pt today with Dr Rip Harbour.  Pt has been able to walk with sitter in hallway.  Psych CSW report noted.  His complaint of Agitation and anxiety may be side effect of FGA Haldol which may cause EPS.  Discussed with attending, need to keep IVC until pt is transferred to a psychiatric Unit.  He remains at risk for SI/HI even though he says he is calm; > 1 gun is know to be accessible in mother's or father's home.  RECOMMENDATION: 1. Request IVC signature 2. Consider Cogentin .05 mg oral twice daily for EPS, to DC when symptoms subside 3. When medically stable, transfer to [facility] or comparable psychiatric inpatient service 4. Psychiatrist is signing off unless further eval needed.  Kendal Raffo 11/03/2011, 10:38 PM

## 2011-11-03 NOTE — Progress Notes (Signed)
Met with Pt and his mom.  Notified them of [facility] bed status.    Pt experiencing anxiety and agitation.  Encouraged Pt to discuss this with MD.  Encouraged Pt to walk when he feels this way.  Pt states that he's been walking and that this helps.  Reviewed chart.  Noted that IVC documents were filed on 10/30/11.  These documents are valid for 7 days.  CSW to continue to follow.  Providence Crosby, LCSWA Clinical Social Work [phone]

## 2011-11-03 NOTE — Progress Notes (Signed)
Per [facility], no 400 hall bed available, at this time.  CSW to continue to follow.  Providence Crosby, LCSWA Clinical Social Work [phone]

## 2011-11-04 ENCOUNTER — Inpatient Hospital Stay (HOSPITAL_COMMUNITY)
Admission: AD | Admit: 2011-11-04 | Discharge: 2011-11-07 | DRG: 897 | Disposition: A | Discharge status: Home / Self Care | Payer: 59 | Source: Ambulatory Visit | Attending: Psychiatry | Admitting: Psychiatry

## 2011-11-04 ENCOUNTER — Encounter (HOSPITAL_COMMUNITY): Payer: Self-pay | Admitting: Psychiatry

## 2011-11-04 DIAGNOSIS — Z888 Allergy status to other drugs, medicaments and biological substances status: Secondary | ICD-10-CM

## 2011-11-04 DIAGNOSIS — F1610 Hallucinogen abuse, uncomplicated: Secondary | ICD-10-CM

## 2011-11-04 DIAGNOSIS — Z634 Disappearance and death of family member: Secondary | ICD-10-CM

## 2011-11-04 DIAGNOSIS — F1220 Cannabis dependence, uncomplicated: Principal | ICD-10-CM

## 2011-11-04 DIAGNOSIS — Z79899 Other long term (current) drug therapy: Secondary | ICD-10-CM

## 2011-11-04 DIAGNOSIS — J45901 Unspecified asthma with (acute) exacerbation: Secondary | ICD-10-CM

## 2011-11-04 DIAGNOSIS — J45909 Unspecified asthma, uncomplicated: Secondary | ICD-10-CM

## 2011-11-04 DIAGNOSIS — F430 Acute stress reaction: Secondary | ICD-10-CM

## 2011-11-04 DIAGNOSIS — F909 Attention-deficit hyperactivity disorder, unspecified type: Secondary | ICD-10-CM

## 2011-11-04 DIAGNOSIS — F319 Bipolar disorder, unspecified: Secondary | ICD-10-CM

## 2011-11-04 DIAGNOSIS — Z6379 Other stressful life events affecting family and household: Secondary | ICD-10-CM

## 2011-11-04 DIAGNOSIS — R45851 Suicidal ideations: Secondary | ICD-10-CM

## 2011-11-04 HISTORY — DX: Depression, unspecified: F32.A

## 2011-11-04 HISTORY — DX: Major depressive disorder, single episode, unspecified: F32.9

## 2011-11-04 LAB — COMPREHENSIVE METABOLIC PANEL
Alkaline Phosphatase: 58 U/L (ref 39–117)
BUN: 15 mg/dL (ref 6–23)
Calcium: 9.1 mg/dL (ref 8.4–10.5)
Creatinine, Ser: 0.91 mg/dL (ref 0.50–1.35)
GFR calc Af Amer: >90 mL/min (ref >90)
Glucose, Bld: 81 mg/dL (ref 70–99)
Potassium: 3.3 mEq/L — ABNORMAL LOW (ref 3.5–5.1)
Total Protein: 6.4 g/dL (ref 6.0–8.3)

## 2011-11-04 LAB — DIFFERENTIAL
Eosinophils Absolute: 0.9 10*3/uL — ABNORMAL HIGH (ref 0.0–0.7)
Eosinophils Relative: 7 % — ABNORMAL HIGH (ref 0–5)
Lymphs Abs: 4.8 10*3/uL — ABNORMAL HIGH (ref 0.7–4.0)
Monocytes Absolute: 1 10*3/uL (ref 0.1–1.0)
Monocytes Relative: 7 % (ref 3–12)

## 2011-11-04 LAB — CBC
HCT: 46.6 % (ref 39.0–52.0)
Hemoglobin: 16.4 g/dL (ref 13.0–17.0)
MCH: 29.3 pg (ref 26.0–34.0)
MCHC: 35.2 g/dL (ref 30.0–36.0)
MCV: 83.4 fL (ref 78.0–100.0)
RBC: 5.59 MIL/uL (ref 4.22–5.81)

## 2011-11-04 MED ORDER — ACETAMINOPHEN 325 MG PO TABS: 650.0000 mg | ORAL_TABLET | Freq: Four times a day (QID) | ORAL | Status: DC | PRN

## 2011-11-04 MED ORDER — ALBUTEROL SULFATE HFA 108 (90 BASE) MCG/ACT IN AERS: 2.0000 | INHALATION_SPRAY | RESPIRATORY_TRACT | Status: DC | PRN

## 2011-11-04 MED ORDER — ALUM & MAG HYDROXIDE-SIMETH 200-200-20 MG/5ML PO SUSP: 30.0000 mL | ORAL | Status: DC | PRN

## 2011-11-04 MED ORDER — DIPHENHYDRAMINE HCL 25 MG PO CAPS
50.0000 mg | ORAL_CAPSULE | Freq: Every evening | ORAL | Status: DC | PRN
  Administered 2011-11-04 – 2011-11-06 (×5): 50 mg via ORAL

## 2011-11-04 MED ORDER — HYDROXYZINE PAMOATE 25 MG PO CAPS: 25.0000 mg | ORAL_CAPSULE | Freq: Three times a day (TID) | ORAL | Status: DC | PRN

## 2011-11-04 MED ORDER — FLUTICASONE-SALMETEROL 100-50 MCG/DOSE IN AEPB: 1.0000 | INHALATION_SPRAY | Freq: Two times a day (BID) | RESPIRATORY_TRACT | Status: DC

## 2011-11-04 MED ORDER — PREDNISONE 10 MG PO TABS
10.0000 mg | ORAL_TABLET | Freq: Once | ORAL | Status: AC
  Administered 2011-11-05: 10 mg via ORAL

## 2011-11-04 MED ORDER — FLUTICASONE-SALMETEROL 100-50 MCG/DOSE IN AEPB
1.0000 | INHALATION_SPRAY | Freq: Two times a day (BID) | RESPIRATORY_TRACT | Status: DC
  Administered 2011-11-04 – 2011-11-07 (×6): 1 via RESPIRATORY_TRACT
  Filled 2011-11-04: qty 14

## 2011-11-04 MED ORDER — PREDNISONE 20 MG PO TABS
20.0000 mg | ORAL_TABLET | Freq: Once | ORAL | Status: AC
  Administered 2011-11-04: 20 mg via ORAL
  Filled 2011-11-04: qty 1

## 2011-11-04 MED ORDER — MOXIFLOXACIN HCL 400 MG PO TABS: 400.0000 mg | ORAL_TABLET | Freq: Every day | ORAL | Status: DC

## 2011-11-04 MED ORDER — FLUTICASONE PROPIONATE 50 MCG/ACT NA SUSP
2.0000 | Freq: Every day | NASAL | Status: DC
  Administered 2011-11-05 – 2011-11-07 (×3): 2 via NASAL

## 2011-11-04 MED ORDER — GUAIFENESIN ER 600 MG PO TB12
600.0000 mg | ORAL_TABLET | Freq: Two times a day (BID) | ORAL | Status: DC
  Administered 2011-11-04 – 2011-11-07 (×6): 600 mg via ORAL
  Filled 2011-11-04 (×7): qty 1

## 2011-11-04 MED ORDER — MAGNESIUM HYDROXIDE 400 MG/5ML PO SUSP: 30.0000 mL | Freq: Every day | ORAL | Status: DC | PRN

## 2011-11-04 MED ORDER — NICOTINE 21 MG/24HR TD PT24: 1.0000 | MEDICATED_PATCH | Freq: Every day | TRANSDERMAL | Status: DC

## 2011-11-04 MED ORDER — ALPRAZOLAM 1 MG PO TABS: 1.0000 mg | ORAL_TABLET | Freq: Three times a day (TID) | ORAL | Status: DC | PRN

## 2011-11-04 NOTE — Progress Notes (Signed)
Discharge instructions accompanied pt, left the unit in stable condition. Pt d/c to [facility]. Report called to RN at [facility] prior to pt leaving the unit.

## 2011-11-04 NOTE — Progress Notes (Signed)
Per [facility], bed ready for Pt.  Notified MD, RN, Pt and family.  MD to write d/c order.  Pt signed consent.  Faxed consent and IVC documentation to [facility].  [employer] contacted for transport.  Pt to be d/c'd.  Providence Crosby, LCSWA Clinical Social Work [phone]
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
## 2011-11-04 NOTE — Progress Notes (Signed)
Pt positive for group, interacting appropriately on unit.  No acute distress noted, no complaints voiced at this time.  Pt denies SI/HI/hallucinations at present.  Support and encouragement offered, will continue to monitor.

## 2011-11-04 NOTE — Tx Team (Signed)
Initial Interdisciplinary Treatment Plan  PATIENT STRENGTHS: (choose at least two) Ability for insight Capable of independent living Communication skills  PATIENT STRESSORS: Financial difficulties Health problems Legal issue Loss of cousin, 3 mos ago* Occupational concerns Substance abuse   PROBLEM LIST: Problem List/Patient Goals Date to be addressed Date deferred Reason deferred Estimated date of resolution  Depression                                                       DISCHARGE CRITERIA:  Ability to meet basic life and health needs Adequate post-discharge living arrangements Improved stabilization in mood, thinking, and/or behavior Medical problems require only outpatient monitoring Need for constant or close observation no longer present Reduction of life-threatening or endangering symptoms to within safe limits Verbal commitment to aftercare and medication compliance  PRELIMINARY DISCHARGE PLAN: Outpatient therapy  PATIENT/FAMIILY INVOLVEMENT: This treatment plan has been presented to and reviewed with the patient, [name] Hall, and/or family member.  The patient and family have been given the opportunity to ask questions and make suggestions.  Dyke Maes 11/04/2011, 1:17 PM

## 2011-11-04 NOTE — Discharge Summary (Signed)
DISCHARGE SUMMARY  [name] Hall  MR#: [id number]  DOB:[birth year]  Date of Admission: 10/28/2011 Date of Discharge: 11/04/2011  Attending Physician:Valincia Touch  Patient's PCP:No Pcp  Consults: Pulmonology-Dr Delford Field  Discharge Diagnoses: #1 acute asthma exacerbation. #2 acute psychosis on one-on-one sitter #3 history of bipolar affective disorder #4 substance abuse #5 chronic tobacco use.   Present on Admission:  .Psychosis    Current Discharge Medication List    START taking these medications   Details  acetaminophen (TYLENOL) 325 MG tablet Take 2 tablets (650 mg total) by mouth every 6 (six) hours as needed (or Fever >/= 101). Qty: 30 tablet, Refills: 1    ALPRAZolam (XANAX) 1 MG tablet Take 1 tablet (1 mg total) by mouth 3 (three) times daily as needed for sleep. Qty: 40 tablet, Refills: 0    Fluticasone-Salmeterol (ADVAIR) 100-50 MCG/DOSE AEPB Inhale 1 puff into the lungs 2 (two) times daily. Qty: 60 each, Refills: 1    moxifloxacin (AVELOX) 400 MG tablet Take 1 tablet (400 mg total) by mouth daily. Qty: 4 tablet, Refills: 0    nicotine (NICODERM CQ - DOSED IN MG/24 HOURS) 21 mg/24hr patch Place 1 patch (21 patches total) onto the skin daily. Qty: 21 patch, Refills: 0      CONTINUE these medications which have NOT CHANGED   Details  albuterol (PROVENTIL HFA;VENTOLIN HFA) 108 (90 BASE) MCG/ACT inhaler Inhale 2 puffs into the lungs every 6 (six) hours as needed. For shortness of breath    predniSONE (DELTASONE) 10 MG tablet 5,4,3,2,1 taper       STOP taking these medications     albuterol (PROVENTIL) (2.5 MG/3ML) 0.083% nebulizer solution           Hospital Course: Patient is a 26 year old Caucasian male with history of asthma, Bipolar affective disorder, chronic tobacco use, was admitted to the hospital 10/28/2011 be complained of increasing shortness of breath or cough patient was also said to have a history of rhinorrhea. No documented  history  of chest pain or  fever. In the ED, patient was said to have been given breathing treatment and got some improvement of subsequently admitted for stabilization. Patient was also said to be acting strangely strangely and making verbal threats.     Patient was admitted to general medical floor on one-to-one sitter. He was started on nebulizer treatment. He was also started on Avelox 400 mg by mouth daily. Patient's agitation was controlled with injectable Haldol. He was also restarted on Xanax for generalized anxiety disorder. Also given to patient was Mucinex as cough suppressant. Patient was expresses willingness to quit smoking and was restarted and was started on nicotine patch. The pulmonologist, Dr. Delford Field recommended the patient is to follow up post discharge.    At the time patient was seen by me, he was still coughing, and examination showed scattered rhonchi in the lungs. Advair discus was subsequently added to patient's regimen the patient was started on tapered doses of prednisone. So far patient has made remarkable progress,calm and less agitated. Examination of the lung showed minimally scattered rhonchi.    Patient evaluated by me today 11/04/2011, denied any complaint, eager and willing to go to behavior health, lung exam showed mild rhonchi, and vital signs are stable. Plans for patient to be [facility].   Day of Discharge BP 100/61  Pulse 54  Temp(Src) 97.7 F (36.5 C) (Oral)  Resp 16  Ht 5\' 11"  (1.803 m)  Wt 60.7 kg (133 lb  13.1 oz)  BMI 18.66 kg/m2  SpO2 95%  Physical Exam: Vitals as above. HEENT-mild pallor Neck-no lymphadenopathy Chest-minimal scattered rhonchi. CVS-S1 and S2. Abdomen-soft, nontender, no organomegaly, bowel sounds present. Extremity-no pedal edema Skin-no ecchymoses Neuropsych-appropriate affect.  Results for orders placed during the hospital encounter of 10/28/11 (from the past 24 hour(s))  CBC     Status: Abnormal    Collection Time   11/04/11  5:10 AM      Component Value Range   WBC 13.4 (*) 4.0 - 10.5 (K/uL)   RBC 5.59  4.22 - 5.81 (MIL/uL)   Hemoglobin 16.4  13.0 - 17.0 (g/dL)   HCT 16.1  09.6 - 04.5 (%)   MCV 83.4  78.0 - 100.0 (fL)   MCH 29.3  26.0 - 34.0 (pg)   MCHC 35.2  30.0 - 36.0 (g/dL)   RDW 40.9  81.1 - 91.4 (%)   Platelets 263  150 - 400 (K/uL)  DIFFERENTIAL     Status: Abnormal   Collection Time   11/04/11  5:10 AM      Component Value Range   Neutrophils Relative 50  43 - 77 (%)   Neutro Abs 6.7  1.7 - 7.7 (K/uL)   Lymphocytes Relative 36  12 - 46 (%)   Lymphs Abs 4.8 (*) 0.7 - 4.0 (K/uL)   Monocytes Relative 7  3 - 12 (%)   Monocytes Absolute 1.0  0.1 - 1.0 (K/uL)   Eosinophils Relative 7 (*) 0 - 5 (%)   Eosinophils Absolute 0.9 (*) 0.0 - 0.7 (K/uL)   Basophils Relative 0  0 - 1 (%)   Basophils Absolute 0.0  0.0 - 0.1 (K/uL)  COMPREHENSIVE METABOLIC PANEL     Status: Abnormal   Collection Time   11/04/11  5:10 AM      Component Value Range   Sodium 137  135 - 145 (mEq/L)   Potassium 3.3 (*) 3.5 - 5.1 (mEq/L)   Chloride 101  96 - 112 (mEq/L)   CO2 27  19 - 32 (mEq/L)   Glucose, Bld 81  70 - 99 (mg/dL)   BUN 15  6 - 23 (mg/dL)   Creatinine, Ser 7.82  0.50 - 1.35 (mg/dL)   Calcium 9.1  8.4 - 95.6 (mg/dL)   Total Protein 6.4  6.0 - 8.3 (g/dL)   Albumin 3.6  3.5 - 5.2 (g/dL)   AST 9  0 - 37 (U/L)   ALT 11  0 - 53 (U/L)   Alkaline Phosphatase 58  39 - 117 (U/L)   Total Bilirubin 0.4  0.3 - 1.2 (mg/dL)   GFR calc non Af Amer >90  >90 (mL/min)   GFR calc Af Amer >90  >90 (mL/min)    Disposition: stabl   Follow-up Appts: Discharge Orders    Future Orders Please Complete By Expires   Diet - low sodium heart healthy      Increase activity slowly      Discharge instructions      Comments:   Follow up with pcp in 1-2weeks     Follow-up with pulmonology-Dr. Delford Field in 1-2 weeks.   SignedTalmage Nap 11/04/2011, 10:57 AM

## 2011-11-04 NOTE — Progress Notes (Signed)
Recreation Therapy Notes  11/04/2011         Time: 16109      Group Topic/Focus: The focus of this group is on enhancing the patient's understanding of leisure, barriers to leisure, and the importance of engaging in positive leisure activities upon discharge for improved total health.  Participation Level: Active  Participation Quality: Monopolizing  Affect: Appropriate  Cognitive: Oriented   Additional Comments: Patient repeatedly stating that he uses marijuana and when the group discussed self-medicating, patient reported he will keep using because it works for him better than prescribed medication.   Danalee Flath 11/04/2011 3:55 PM

## 2011-11-04 NOTE — Progress Notes (Signed)
Pt refused all his A.M. Respiratory Therapy treatments= neb tx, cpt, advair
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
## 2011-11-05 DIAGNOSIS — F438 Other reactions to severe stress: Secondary | ICD-10-CM

## 2011-11-05 MED ORDER — MOXIFLOXACIN HCL 400 MG PO TABS
400.0000 mg | ORAL_TABLET | Freq: Every day | ORAL | Status: DC
  Administered 2011-11-05 – 2011-11-06 (×2): 400 mg via ORAL
  Filled 2011-11-05 (×3): qty 1

## 2011-11-05 NOTE — Progress Notes (Signed)
[facility] Group Notes:  (Counselor/Nursing/MHT/Case Management/Adjunct)  11/05/2011 1:15 PM  Type of Therapy:  Group Therapy, Dance/Movement Therapy   Participation Level:  Active  Participation Quality:  Appropriate and Sharing  Affect:  Appropriate  Cognitive:  Appropriate  Insight:  Good  Engagement in Group:  Good  Engagement in Therapy:  Good  Modes of Intervention:  Clarification, Problem-solving, Role-play, Socialization and Support  Summary of Progress/Problems: pt spoke about feelings and feeling like the color "blue" today foris basketball team lost today and he is sad. Pt spoke about the difficulty in staying sober and how self sabotage and enabling behavior often leads to release. Pt offered positive support to peers.

## 2011-11-05 NOTE — Progress Notes (Signed)
Pt has been up and positive for group, no complaints voiced.  Pt denies SI/HI/hallucinations  Pt is interacting appropriately within milieu.  Pt started on Avelox today and is tolerating well.  Support and encouragement offered, will continue to monitor.
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
## 2011-11-05 NOTE — Progress Notes (Signed)
[facility] Group Notes:  (Counselor/Nursing/MHT/Case Management/Adjunct)  11/05/2011 5:46 PM  Type of Therapy:  Psychoeducational Skills  Participation Level:  Active  Participation Quality:  Resistant  Affect:  Irritable  Cognitive:  Alert and Oriented  Insight:  None  Engagement in Group:  Good  Engagement in Therapy:  n/a  Modes of Intervention:  Clarification, Education, Limit-setting, Problem-solving, Socialization and Support  Summary of Progress/Problems:  [name] Hall attended group orienting pt. to [facility]. Reviewed treatment agreement, unit expectation, hall schedule, staff roles, and contraband.[name] Hall was questioned this Clinical research associate about several unit rules, and maintained focus on small discomforts caused by unit rules. [name] Hall was active in discussion about goals, what makes a S.M.A.R.T. goal (Specific, Measurable, Achievable, Relevant, and Time Oriented) and what goals the group would like to work on during their admission.     [name] Hall 11/05/2011, 5:46 PM

## 2011-11-05 NOTE — Progress Notes (Signed)
Patient ID: [name] Hall, male   DOB: Feb 24, 1986, 26 y.o.   MRN: [id number] Pt out in milieu interacting with peers and attending groups. Minimal insight into SA issues and is glorifying use to peers. Denies depression and hopelessness.  Reports poor sleep,good appetite, and normal energy.  Denies SI.  12 step concepts introduced. Positive feedback and support given.  Cont POC. Continue 15' checks for safety.
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
## 2011-11-05 NOTE — H&P (Signed)
Pt seen and evaluated upon admission, discussed with physician extender (AN) on 11/04/11.  Completed Admission Suicide Risk Assessment.  See orders.  Pt agreeable with plan.  Discussed with team.

## 2011-11-06 DIAGNOSIS — R45851 Suicidal ideations: Secondary | ICD-10-CM
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
## 2011-11-06 NOTE — Progress Notes (Signed)
Patient ID: [name] Hall, male   DOB: [birth year], 26 y.o.   MRN: [id number] Pt isolative to room but did attend d/c planning and cafeteria. Denies all symptoms on patient inventory including SI. Mother called to report that pt is threatning to "bust out". Enquired to CM/counselor about what would happen if he kicked out the window. Informed staff and front desk. No physical complaints.  Cont to assess mental status and safety. Cont POC and 15' checks.

## 2011-11-06 NOTE — Progress Notes (Signed)
Pt is resting in bed with eyes closed. RR WNL, even and unlabored. Level III obs continued. Pt is safe. Lawrence Marseilles

## 2011-11-06 NOTE — Progress Notes (Signed)
[facility] Adult Inpatient Family/Significant Other Suicide Prevention Education  Suicide Prevention Education:  Patient Refusal for Family/Significant Other Suicide Prevention Education: The patient [name] Hall has refused to provide written consent for family/significant other to be provided Family/Significant Other Suicide Prevention Education during admission and/or prior to discharge.  Physician notified.   Pt. accepted information on suicide prevention, warning signs to look for with suicide and crisis line numbers to use. The pt. agreed to call crisis line numbers if having warning signs or having thoughts of suicide.  Pt stated that he has guns but they are locked in a safe he does not have access to and that he can't have them or access to them on probation.   [facility] 11/06/2011, 9:48 AM

## 2011-11-06 NOTE — Progress Notes (Signed)
[facility] Group Notes:  (Counselor/Nursing/MHT/Case Management/Adjunct)  11/06/2011 1:15 PM  Type of Therapy:  Group Therapy, Dance/Movement Therapy   Participation Level:  Active  Participation Quality:  Appropriate, Attentive, Sharing and Supportive  Affect:  Appropriate  Cognitive:  Appropriate  Insight:  Good  Engagement in Group:  Good  Engagement in Therapy:  Good  Modes of Intervention:  Clarification, Problem-solving, Role-play, Socialization and Support  Summary of Progress/Problems: pt participated in a group discussion on supports and how to use positive supports. Pt spoke about being a positive support for themselves and read 10 "just for today" sayings. Pt spoke about how to apply at least one of the just for today's in their current struggle and upon D/C.

## 2011-11-06 NOTE — Progress Notes (Signed)
  [name] Hall is a 26 y.o. male [id number] 07-Nov-1985  11/04/2011 Principal Problem:  *Suicidal ideation   Mental Status:  alert & oriented . Denies active SI/HI/AVH but found himself thinking about cousin who suicided.  Subjective/Objective: Dressed active in [place]. Ambivalent about being here. Wants to get back to his job. Covering Psychiatrist told him that since he came to [facility] due to IVC he did not feel comfortable discharging him. Still denies a need for meds but is not opposed to talking about cousin's suicide.     Filed Vitals:   11/06/11 0616  BP: 123/76  Pulse: 59  Temp:   Resp:     Lab Results:   BMET    Component Value Date/Time   NA 137 11/04/2011 0510   K 3.3* 11/04/2011 0510   CL 101 11/04/2011 0510   CO2 27 11/04/2011 0510   GLUCOSE 81 11/04/2011 0510   BUN 15 11/04/2011 0510   CREATININE 0.91 11/04/2011 0510   CALCIUM 9.1 11/04/2011 0510   GFRNONAA >90 11/04/2011 0510   GFRAA >90 11/04/2011 0510    Medications:  Scheduled:     . fluticasone  2 spray Each Nare Daily  . Fluticasone-Salmeterol  1 puff Inhalation BID  . guaiFENesin  600 mg Oral BID  . moxifloxacin  400 mg Oral q1800     PRN Meds acetaminophen, albuterol, alum & mag hydroxide-simeth, diphenhydrAMINE, hydrOXYzine, magnesium hydroxide  Plan: continue current plan of care.  Jeryl Wilbourn,MICKIE D. 11/06/2011

## 2011-11-06 NOTE — Progress Notes (Signed)
[facility] Group Notes:  (Counselor/Nursing/MHT/Case Management/Adjunct)  11/06/2011 6:41 PM  Type of Therapy:  Psychoeducational Skills  Participation Level:  Active  Participation Quality:  Appropriate, Attentive, Intrusive and Redirectable  Affect:  Excited  Cognitive:  Alert, Appropriate and Oriented  Insight:  Limited  Engagement in Group:  Good  Engagement in Therapy:  n/a  Modes of Intervention:  Activity, Education, Problem-solving, Socialization and Support  Summary of Progress/Problems:  Josh attended Psychoeducational group on labels. Jsoh participated in activity labeling self and peers. Group defined what labels are,  how they use them and how they have been labeled as well as listing negative and positive labels they have dealt with. Group read information on practicing self forgiveness. Josh was given homework assignment to list negative labels they have had and to find the reality of the label.   Wandra Scot 11/06/2011, 6:41 PM

## 2011-11-06 NOTE — Progress Notes (Signed)
Pt  Has attended groups today, no complaints voiced.  Pt's mother did call and inform day shift RN that Pt had told her on the phone that he is going to break out of here.  Pt has not acted in aggressive manner on unit and has acted appropriately within milieu.  Pt denies SI/HI/hallucinations at this time.  Support and encouragement offered, will continue to monitor.
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
## 2011-11-07 NOTE — Progress Notes (Signed)
[facility] Case Management Discharge Plan:  Will you be returning to the same living situation after discharge: Yes,  With a friend until he can get set up in a trailer with help from mom Would you like a referral for services when you are discharged:Yes,  see below Do you have access to transportation at discharge:Yes,  auto Do you have the ability to pay for your medications:No.  None prescribed  Interagency Information:     Patient to Follow up at:  Follow-up Information    Follow up with [facility] on 11/09/2011. (8 AM   This is to get set up with a counselor)    Contact information:   [address]  [name]  [address]  [phone]         Patient denies SI/HI:   Yes,  yes    Safety Planning and Suicide Prevention discussed:  Yes,  yes  Barrier to discharge identified:No.  Summary and Recommendations:   Ida Rogue 11/07/2011, 12:07 PM

## 2011-11-07 NOTE — Treatment Plan (Signed)
Interdisciplinary Treatment Plan Update (Adult)  Date: 11/07/2011  Time Reviewed: 10:05 AM   Progress in Treatment: Attending groups: Yes Participating in groups: Yes Taking medication as prescribed: Yes Tolerating medication: Yes   Family/Significant othe contact made:   Patient understands diagnosis:  Yes Discussing patient identified problems/goals with staff:  Yes Medical problems stabilized or resolved:  Yes Denies suicidal/homicidal ideation: Yes Issues/concerns per patient self-inventory:  None noted Other:  New problem(s) identified: N/A  Reason for Continuation of Hospitalization: Other; describe D/C today  Interventions implemented related to continuation of hospitalization:   Additional comments:  Estimated length of stay: D/C today  Discharge Plan: Return home, follow up [facility]  New goal(s): N/A  Review of initial/current patient goals per problem list:   1.  Goal(s):Identify comprehensive sobriety plan  Met:  Yes  Target date:  As evidenced [id number] for therapy, NA mtgs  2.  Goal (s):Decrease depression  Met:  Yes  Target date:  As evidenced [id number] less than 3 at d/c  3.  Goal(s):  Met:  Yes  Target date:  As evidenced by:  4.  Goal(s):  Met:  Yes  Target date:  As evidenced by:  Attendees: Patient:  [name] Hall 11/07/2011 10:05 AM  Family:     Physician:  Lupe Carney 11/07/2011 10:05 AM   Nursing: Hassan Rowan.   11/07/2011 10:05 AM   Case Manager:  Richelle Ito, LCSW 11/07/2011 10:05 AM   Counselor:  Ronda Fairly, LCSWA 11/07/2011 10:05 AM   Other: Shelda Jakes  11/07/2011 10:05 AM  Other:     Other:     Other:      Scribe for Treatment Team:   Daryel Gerald B, 11/07/2011 10:05 AM

## 2011-11-07 NOTE — Progress Notes (Signed)
Patient ID: [name] Hall, male   DOB: Aug 06, 1986, 26 y.o.   MRN: [id number] Patient has order for d/c and denies SI/HI. Verbalized understanding of follow up. No concerns at time of d/c. Appeared ready to d/c.

## 2011-11-07 NOTE — Progress Notes (Signed)
Pt's belongings were returned upon discharge from facility.
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
## 2011-11-09 NOTE — Progress Notes (Signed)
Patient Discharge Instructions:  Admission Note Faxed,  11/08/2011 After Visit Summary Faxed,  11/08/2011 Faxed to the Next Level Care provider:  11/08/2011 Facesheet faxed 11/08/2011  Faxed to [facility] @ [phone]  Ivo Moga, Eduard Clos, 11/09/2011, 12:46 PM

## 2012-04-25 ENCOUNTER — Emergency Department (HOSPITAL_BASED_OUTPATIENT_CLINIC_OR_DEPARTMENT_OTHER)
Admission: EM | Admit: 2012-04-25 | Discharge: 2012-04-25 | Disposition: A | Discharge status: Home / Self Care | Payer: Self-pay | Attending: Emergency Medicine | Admitting: Emergency Medicine

## 2012-04-25 ENCOUNTER — Encounter (HOSPITAL_BASED_OUTPATIENT_CLINIC_OR_DEPARTMENT_OTHER): Payer: Self-pay

## 2012-04-25 DIAGNOSIS — F17200 Nicotine dependence, unspecified, uncomplicated: Secondary | ICD-10-CM | POA: Insufficient documentation

## 2012-04-25 DIAGNOSIS — M79609 Pain in unspecified limb: Secondary | ICD-10-CM | POA: Insufficient documentation

## 2012-04-25 DIAGNOSIS — G5602 Carpal tunnel syndrome, left upper limb: Secondary | ICD-10-CM

## 2012-04-25 DIAGNOSIS — M62830 Muscle spasm of back: Secondary | ICD-10-CM

## 2012-04-25 DIAGNOSIS — Z825 Family history of asthma and other chronic lower respiratory diseases: Secondary | ICD-10-CM | POA: Insufficient documentation

## 2012-04-25 DIAGNOSIS — M5380 Other specified dorsopathies, site unspecified: Secondary | ICD-10-CM | POA: Insufficient documentation

## 2012-04-25 DIAGNOSIS — F3289 Other specified depressive episodes: Secondary | ICD-10-CM | POA: Insufficient documentation

## 2012-04-25 DIAGNOSIS — J45909 Unspecified asthma, uncomplicated: Secondary | ICD-10-CM | POA: Insufficient documentation

## 2012-04-25 DIAGNOSIS — F329 Major depressive disorder, single episode, unspecified: Secondary | ICD-10-CM | POA: Insufficient documentation

## 2012-04-25 MED ORDER — IBUPROFEN 800 MG PO TABS: 800.0000 mg | ORAL_TABLET | Freq: Three times a day (TID) | ORAL | Status: DC | PRN

## 2012-04-25 MED ORDER — CYCLOBENZAPRINE HCL 10 MG PO TABS: 10.0000 mg | ORAL_TABLET | Freq: Three times a day (TID) | ORAL | Status: AC | PRN

## 2012-04-25 MED ORDER — OXYCODONE-ACETAMINOPHEN 5-325 MG PO TABS: 1.0000 | ORAL_TABLET | Freq: Four times a day (QID) | ORAL | Status: DC | PRN

## 2012-04-25 MED ORDER — KETOROLAC TROMETHAMINE 30 MG/ML IJ SOLN
60.0000 mg | Freq: Once | INTRAMUSCULAR | Status: AC
  Administered 2012-04-25: 60 mg via INTRAMUSCULAR
  Filled 2012-04-25 (×2): qty 1

## 2012-04-25 NOTE — ED Provider Notes (Signed)
History     CSN: [id number]  Arrival date & time 04/25/12  0800   First MD Initiated Contact with Patient 04/25/12 0801      Chief Complaint  Patient presents with  . Back Pain    (Consider location/radiation/quality/duration/timing/severity/associated sxs/prior treatment) HPI Pt with history of back problems reports he was bending over at work today when he had sudden onset of severe bilateral sharp lower back pain. No pop, no numbness, tingling, weakness in legs. No incontinence. His pain is worse with movement.   He has a secondary complaint of L upper extremity pain, off and on for the last several months, pain is a shooting pain, moderate, starts in wrist and moves up arm. Occasionally starts in elbow and moves down. Worse with extension of his wrist at work. Occasionally has some tingling in fingers. No trauma.   Past Medical History  Diagnosis Date  . Asthma   . Mental disorder   . Depression     History reviewed. No pertinent past surgical history.  Family History  Problem Relation Age of Onset  . Asthma Father   . Alcohol abuse Father   . Asthma Brother     History  Substance Use Topics  . Smoking status: Former Smoker -- 0.5 packs/day    Quit date: 11/04/2011  . Smokeless tobacco: Never Used  . Alcohol Use: 0.6 oz/week    1 Cans of beer per week      Review of Systems All other systems reviewed and are negative except as noted in HPI.   Allergies  Uncoded nonscreenable allergen  Home Medications   Current Outpatient Rx  Name Route Sig Dispense Refill  . ALBUTEROL SULFATE HFA 108 (90 BASE) MCG/ACT IN AERS Inhalation Inhale 2 puffs into the lungs every 6 (six) hours as needed. For shortness of breath    . FLUTICASONE-SALMETEROL 100-50 MCG/DOSE IN AEPB Inhalation Inhale 1 puff into the lungs 2 (two) times daily. 60 each 1    BP 122/70  Pulse 73  Temp 98.2 F (36.8 C) (Oral)  Resp 16  Ht 5\' 11"  (1.803 m)  Wt 160 lb (72.576 kg)  BMI 22.32 kg/m2   SpO2 100%  Physical Exam  Nursing note and vitals reviewed. Constitutional: He is oriented to person, place, and time. He appears well-developed and well-nourished.  HENT:  Head: Normocephalic and atraumatic.  Eyes: EOM are normal. Pupils are equal, round, and reactive to light.  Neck: Normal range of motion. Neck supple.  Cardiovascular: Normal rate, normal heart sounds and intact distal pulses.   Pulmonary/Chest: Effort normal and breath sounds normal.  Abdominal: Bowel sounds are normal. He exhibits no distension. There is no tenderness.  Musculoskeletal: Normal range of motion. He exhibits no edema and no tenderness.       Left wrist: He exhibits tenderness (Tender with wrist extension). He exhibits normal range of motion.       Lumbar back: He exhibits tenderness, pain and spasm. He exhibits no bony tenderness.  Neurological: He is alert and oriented to person, place, and time. He has normal strength. No cranial nerve deficit or sensory deficit.  Skin: Skin is warm and dry. No rash noted.  Psychiatric: He has a normal mood and affect.    ED Course  Procedures (including critical care time)  Labs Reviewed - No data to display No results found.   No diagnosis found.    MDM  Lumbar back spasm, will give pain medications and muscle relaxers. Also suspect  he has mild carpal tunnel syndrome on L. Will provide wrist splint, advised PCP followup.         Charles B. Bernette Mayers, MD 04/25/12 [phone]

## 2012-04-25 NOTE — ED Notes (Signed)
Back pain that started this am.

## 2012-05-01 ENCOUNTER — Emergency Department (HOSPITAL_COMMUNITY)
Admission: EM | Admit: 2012-05-01 | Discharge: 2012-05-01 | Disposition: A | Discharge status: Home / Self Care | Payer: Self-pay | Attending: Emergency Medicine | Admitting: Emergency Medicine

## 2012-05-01 ENCOUNTER — Encounter (HOSPITAL_COMMUNITY): Payer: Self-pay | Admitting: Emergency Medicine

## 2012-05-01 ENCOUNTER — Emergency Department (HOSPITAL_COMMUNITY): Payer: Self-pay

## 2012-05-01 DIAGNOSIS — Z79899 Other long term (current) drug therapy: Secondary | ICD-10-CM | POA: Insufficient documentation

## 2012-05-01 DIAGNOSIS — M545 Low back pain, unspecified: Secondary | ICD-10-CM | POA: Insufficient documentation

## 2012-05-01 DIAGNOSIS — S39012A Strain of muscle, fascia and tendon of lower back, initial encounter: Secondary | ICD-10-CM

## 2012-05-01 DIAGNOSIS — F17200 Nicotine dependence, unspecified, uncomplicated: Secondary | ICD-10-CM | POA: Insufficient documentation

## 2012-05-01 DIAGNOSIS — F329 Major depressive disorder, single episode, unspecified: Secondary | ICD-10-CM | POA: Insufficient documentation

## 2012-05-01 DIAGNOSIS — F489 Nonpsychotic mental disorder, unspecified: Secondary | ICD-10-CM | POA: Insufficient documentation

## 2012-05-01 DIAGNOSIS — X500XXA Overexertion from strenuous movement or load, initial encounter: Secondary | ICD-10-CM | POA: Insufficient documentation

## 2012-05-01 DIAGNOSIS — J45909 Unspecified asthma, uncomplicated: Secondary | ICD-10-CM | POA: Insufficient documentation

## 2012-05-01 DIAGNOSIS — F3289 Other specified depressive episodes: Secondary | ICD-10-CM | POA: Insufficient documentation

## 2012-05-01 DIAGNOSIS — S335XXA Sprain of ligaments of lumbar spine, initial encounter: Secondary | ICD-10-CM | POA: Insufficient documentation

## 2012-05-01 MED ORDER — CYCLOBENZAPRINE HCL 10 MG PO TABS: ORAL_TABLET | ORAL | Status: DC

## 2012-05-01 MED ORDER — OXYCODONE-ACETAMINOPHEN 5-325 MG PO TABS: ORAL_TABLET | ORAL | Status: DC

## 2012-05-01 MED ORDER — IBUPROFEN 800 MG PO TABS
800.0000 mg | ORAL_TABLET | Freq: Once | ORAL | Status: AC
  Administered 2012-05-01: 800 mg via ORAL
  Filled 2012-05-01: qty 1

## 2012-05-01 NOTE — ED Notes (Signed)
Patient with c/o lower back pain that started on 04/25/12, states he pulled his back lifting a 4x6. Seen at [facility]. Patient with continued pain. No new injury

## 2012-05-01 NOTE — ED Provider Notes (Signed)
History     CSN: [id number]  Arrival date & time 05/01/12  0802   First MD Initiated Contact with Patient 05/01/12 [phone]      Chief Complaint  Patient presents with  . Back Pain    (Consider location/radiation/quality/duration/timing/severity/associated sxs/prior treatment) HPI Comments: Injured lower back lifting a 4 x 6 board 6 days ago.  Seen 6 days ago at [facility] and treated empirically for strain.  Has been taking percocet and had some "left over" flexeril.  No radiation.  Pain primarily with movement.    Patient is a 26 y.o. male presenting with back pain. The history is provided by the patient. No language interpreter was used.  Back Pain  This is a new problem. Episode onset: 6 days ago. The problem occurs constantly. The problem has not changed since onset.The pain is associated with lifting heavy objects. The pain is present in the lumbar spine. The quality of the pain is described as aching. The pain does not radiate. The pain is severe. The symptoms are aggravated by bending, twisting and certain positions. Pertinent negatives include no numbness and no weakness. He has tried analgesics for the symptoms.    Past Medical History  Diagnosis Date  . Asthma   . Mental disorder   . Depression     History reviewed. No pertinent past surgical history.  Family History  Problem Relation Age of Onset  . Asthma Father   . Alcohol abuse Father   . Asthma Brother     History  Substance Use Topics  . Smoking status: Current Everyday Smoker -- 0.5 packs/day    Last Attempt to Quit: 11/04/2011  . Smokeless tobacco: Never Used  . Alcohol Use: 0.6 oz/week    1 Cans of beer per week      Review of Systems  Musculoskeletal: Positive for back pain.  Neurological: Negative for weakness and numbness.  All other systems reviewed and are negative.    Allergies  Uncoded nonscreenable allergen  Home Medications   Current Outpatient Rx  Name Route Sig Dispense Refill  .  ALBUTEROL SULFATE HFA 108 (90 BASE) MCG/ACT IN AERS Inhalation Inhale 2 puffs into the lungs every 6 (six) hours as needed. For shortness of breath    . CYCLOBENZAPRINE HCL 10 MG PO TABS Oral Take 1 tablet (10 mg total) by mouth 3 (three) times daily as needed for muscle spasms. 30 tablet 0  . CYCLOBENZAPRINE HCL 10 MG PO TABS  1/2 to one tab po TID 20 tablet 0  . OXYCODONE-ACETAMINOPHEN 5-325 MG PO TABS  One tab po QID prn pain 20 tablet 0    BP 136/78  Pulse 80  Temp 97.7 F (36.5 C) (Oral)  Resp 17  Ht 5\' 11"  (1.803 m)  Wt 160 lb (72.576 kg)  BMI 22.32 kg/m2  SpO2 98%  Physical Exam  Nursing note and vitals reviewed. Constitutional: He is oriented to person, place, and time. He appears well-developed and well-nourished.  HENT:  Head: Normocephalic and atraumatic.  Eyes: EOM are normal.  Neck: Normal range of motion.  Cardiovascular: Normal rate, regular rhythm, normal heart sounds and intact distal pulses.   Pulmonary/Chest: Effort normal and breath sounds normal. No respiratory distress.  Abdominal: Soft. He exhibits no distension. There is no tenderness.  Musculoskeletal: He exhibits tenderness.       Lumbar back: He exhibits decreased range of motion, tenderness, bony tenderness and pain. He exhibits no swelling, no edema, no deformity, no laceration, no spasm  and normal pulse.       Back:  Neurological: He is alert and oriented to person, place, and time.  Skin: Skin is warm and dry.  Psychiatric: He has a normal mood and affect. Judgment normal.    ED Course  Procedures (including critical care time)  Labs Reviewed - No data to display Dg Lumbar Spine Complete  05/01/2012  *RADIOLOGY REPORT*  Clinical Data: Lifting injury, back pain  LUMBAR SPINE - COMPLETE 4+ VIEW  Comparison: None.  Findings: Normal lumbar spine alignment.  No compression fracture or pars defects.  Intact pedicles.  Normal SI joints.  Preserved vertebral body heights and disc spaces.  IMPRESSION: No  acute finding by plain radiography.  Original Report Authenticated By: Judie Petit. Ruel Favors, M.D.     1. Lumbar strain       MDM  rx-percocet, 20 rx-flexeril, 20 OTC ibuprofen Ice F/u with MD of your choice.        Evalina Field, PA 05/01/12 1022

## 2012-05-01 NOTE — ED Provider Notes (Signed)
Medical screening examination/treatment/procedure(s) were performed by non-physician practitioner and as supervising physician I was immediately available for consultation/collaboration.  Donnetta Hutching, MD 05/01/12 1549

## 2012-07-01 ENCOUNTER — Emergency Department (HOSPITAL_BASED_OUTPATIENT_CLINIC_OR_DEPARTMENT_OTHER): Payer: Self-pay

## 2012-07-01 ENCOUNTER — Emergency Department (HOSPITAL_BASED_OUTPATIENT_CLINIC_OR_DEPARTMENT_OTHER)
Admission: EM | Admit: 2012-07-01 | Discharge: 2012-07-01 | Disposition: A | Discharge status: Home / Self Care | Payer: Self-pay | Attending: Emergency Medicine | Admitting: Emergency Medicine

## 2012-07-01 ENCOUNTER — Encounter (HOSPITAL_BASED_OUTPATIENT_CLINIC_OR_DEPARTMENT_OTHER): Payer: Self-pay

## 2012-07-01 DIAGNOSIS — J45909 Unspecified asthma, uncomplicated: Secondary | ICD-10-CM | POA: Insufficient documentation

## 2012-07-01 DIAGNOSIS — F3289 Other specified depressive episodes: Secondary | ICD-10-CM | POA: Insufficient documentation

## 2012-07-01 DIAGNOSIS — M542 Cervicalgia: Secondary | ICD-10-CM | POA: Insufficient documentation

## 2012-07-01 DIAGNOSIS — Z23 Encounter for immunization: Secondary | ICD-10-CM | POA: Insufficient documentation

## 2012-07-01 DIAGNOSIS — T07XXXA Unspecified multiple injuries, initial encounter: Secondary | ICD-10-CM

## 2012-07-01 DIAGNOSIS — S022XXA Fracture of nasal bones, initial encounter for closed fracture: Secondary | ICD-10-CM | POA: Insufficient documentation

## 2012-07-01 DIAGNOSIS — F329 Major depressive disorder, single episode, unspecified: Secondary | ICD-10-CM | POA: Insufficient documentation

## 2012-07-01 DIAGNOSIS — S20229A Contusion of unspecified back wall of thorax, initial encounter: Secondary | ICD-10-CM | POA: Insufficient documentation

## 2012-07-01 DIAGNOSIS — F17200 Nicotine dependence, unspecified, uncomplicated: Secondary | ICD-10-CM | POA: Insufficient documentation

## 2012-07-01 MED ORDER — TETANUS-DIPHTH-ACELL PERTUSSIS 5-2.5-18.5 LF-MCG/0.5 IM SUSP
0.5000 mL | Freq: Once | INTRAMUSCULAR | Status: AC
  Administered 2012-07-01: 0.5 mL via INTRAMUSCULAR
  Filled 2012-07-01: qty 0.5

## 2012-07-01 MED ORDER — HYDROCODONE-ACETAMINOPHEN 5-325 MG PO TABS: 1.0000 | ORAL_TABLET | Freq: Four times a day (QID) | ORAL | Status: AC | PRN
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
## 2012-10-15 ENCOUNTER — Emergency Department (HOSPITAL_COMMUNITY)
Admission: EM | Admit: 2012-10-15 | Discharge: 2012-10-15 | Disposition: A | Discharge status: Home / Self Care | Payer: Self-pay | Attending: Emergency Medicine | Admitting: Emergency Medicine

## 2012-10-15 ENCOUNTER — Encounter (HOSPITAL_COMMUNITY): Payer: Self-pay | Admitting: Emergency Medicine

## 2012-10-15 ENCOUNTER — Emergency Department (HOSPITAL_COMMUNITY): Payer: Self-pay

## 2012-10-15 DIAGNOSIS — R062 Wheezing: Secondary | ICD-10-CM | POA: Insufficient documentation

## 2012-10-15 DIAGNOSIS — R079 Chest pain, unspecified: Secondary | ICD-10-CM | POA: Insufficient documentation

## 2012-10-15 DIAGNOSIS — Z8659 Personal history of other mental and behavioral disorders: Secondary | ICD-10-CM | POA: Insufficient documentation

## 2012-10-15 DIAGNOSIS — R059 Cough, unspecified: Secondary | ICD-10-CM | POA: Insufficient documentation

## 2012-10-15 DIAGNOSIS — Z79899 Other long term (current) drug therapy: Secondary | ICD-10-CM | POA: Insufficient documentation

## 2012-10-15 DIAGNOSIS — R05 Cough: Secondary | ICD-10-CM | POA: Insufficient documentation

## 2012-10-15 DIAGNOSIS — J45901 Unspecified asthma with (acute) exacerbation: Secondary | ICD-10-CM | POA: Insufficient documentation

## 2012-10-15 DIAGNOSIS — R0602 Shortness of breath: Secondary | ICD-10-CM | POA: Insufficient documentation

## 2012-10-15 MED ORDER — PREDNISONE 20 MG PO TABS: 60.0000 mg | ORAL_TABLET | Freq: Every day | ORAL | Status: DC

## 2012-10-15 MED ORDER — ALBUTEROL SULFATE (2.5 MG/3ML) 0.083% IN NEBU: 2.5000 mg | INHALATION_SOLUTION | Freq: Four times a day (QID) | RESPIRATORY_TRACT | Status: DC | PRN

## 2012-10-15 MED ORDER — ALBUTEROL SULFATE (5 MG/ML) 0.5% IN NEBU
5.0000 mg | INHALATION_SOLUTION | Freq: Once | RESPIRATORY_TRACT | Status: AC
  Administered 2012-10-15: 5 mg via RESPIRATORY_TRACT
  Filled 2012-10-15: qty 1

## 2012-10-15 MED ORDER — OXYCODONE-ACETAMINOPHEN 5-325 MG PO TABS: 1.0000 | ORAL_TABLET | Freq: Four times a day (QID) | ORAL | Status: DC | PRN

## 2012-10-15 MED ORDER — OXYCODONE-ACETAMINOPHEN 5-325 MG PO TABS
1.0000 | ORAL_TABLET | Freq: Once | ORAL | Status: AC
  Administered 2012-10-15: 1 via ORAL
  Filled 2012-10-15: qty 1

## 2012-10-15 MED ORDER — ALBUTEROL SULFATE HFA 108 (90 BASE) MCG/ACT IN AERS
2.0000 | INHALATION_SPRAY | RESPIRATORY_TRACT | Status: DC | PRN
  Administered 2012-10-15: 2 via RESPIRATORY_TRACT
  Filled 2012-10-15: qty 6.7

## 2012-10-15 MED ORDER — PREDNISONE 20 MG PO TABS
60.0000 mg | ORAL_TABLET | Freq: Once | ORAL | Status: AC
  Administered 2012-10-15: 60 mg via ORAL
  Filled 2012-10-15: qty 3

## 2012-10-15 MED ORDER — AZITHROMYCIN 250 MG PO TABS: 250.0000 mg | ORAL_TABLET | Freq: Every day | ORAL | Status: DC

## 2012-10-15 MED ORDER — IPRATROPIUM BROMIDE 0.02 % IN SOLN
0.5000 mg | Freq: Once | RESPIRATORY_TRACT | Status: AC
  Administered 2012-10-15: 0.5 mg via RESPIRATORY_TRACT
  Filled 2012-10-15: qty 2.5

## 2012-10-15 NOTE — ED Notes (Signed)
Per pt, increased asthma symptoms for last week, right chest pain with cough-no relief from inhalers
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
## 2013-03-31 ENCOUNTER — Emergency Department (HOSPITAL_COMMUNITY)
Admission: EM | Admit: 2013-03-31 | Discharge: 2013-04-01 | Disposition: A | Discharge status: Other Acute Inpatient Hospital | Payer: Self-pay | Attending: Emergency Medicine | Admitting: Emergency Medicine

## 2013-03-31 ENCOUNTER — Encounter (HOSPITAL_COMMUNITY): Payer: Self-pay | Admitting: *Deleted

## 2013-03-31 DIAGNOSIS — F1910 Other psychoactive substance abuse, uncomplicated: Secondary | ICD-10-CM | POA: Insufficient documentation

## 2013-03-31 DIAGNOSIS — F1010 Alcohol abuse, uncomplicated: Secondary | ICD-10-CM | POA: Insufficient documentation

## 2013-03-31 DIAGNOSIS — Z8659 Personal history of other mental and behavioral disorders: Secondary | ICD-10-CM | POA: Insufficient documentation

## 2013-03-31 DIAGNOSIS — R45851 Suicidal ideations: Secondary | ICD-10-CM | POA: Insufficient documentation

## 2013-03-31 DIAGNOSIS — J45909 Unspecified asthma, uncomplicated: Secondary | ICD-10-CM | POA: Insufficient documentation

## 2013-03-31 DIAGNOSIS — F39 Unspecified mood [affective] disorder: Secondary | ICD-10-CM | POA: Insufficient documentation

## 2013-03-31 DIAGNOSIS — Z87891 Personal history of nicotine dependence: Secondary | ICD-10-CM | POA: Insufficient documentation

## 2013-03-31 LAB — COMPREHENSIVE METABOLIC PANEL
ALT: 10 U/L (ref 0–53)
AST: 18 U/L (ref 0–37)
CO2: 21 mEq/L (ref 19–32)
Chloride: 106 mEq/L (ref 96–112)
GFR calc Af Amer: >90 mL/min (ref >90)
GFR calc non Af Amer: >90 mL/min (ref >90)
Glucose, Bld: 99 mg/dL (ref 70–99)
Sodium: 141 mEq/L (ref 135–145)
Total Bilirubin: 0.3 mg/dL (ref 0.3–1.2)

## 2013-03-31 LAB — CBC
Hemoglobin: 15.8 g/dL (ref 13.0–17.0)
MCV: 84.5 fL (ref 78.0–100.0)
Platelets: 271 10*3/uL (ref 150–400)
RBC: 5.24 MIL/uL (ref 4.22–5.81)
WBC: 8 10*3/uL (ref 4.0–10.5)

## 2013-03-31 MED ORDER — ZOLPIDEM TARTRATE 5 MG PO TABS: 5.0000 mg | ORAL_TABLET | Freq: Every evening | ORAL | Status: DC | PRN

## 2013-03-31 MED ORDER — LORAZEPAM 1 MG PO TABS: 1.0000 mg | ORAL_TABLET | Freq: Three times a day (TID) | ORAL | Status: DC | PRN

## 2013-03-31 MED ORDER — ALBUTEROL SULFATE HFA 108 (90 BASE) MCG/ACT IN AERS
2.0000 | INHALATION_SPRAY | Freq: Four times a day (QID) | RESPIRATORY_TRACT | Status: DC | PRN
  Administered 2013-03-31: 2 via RESPIRATORY_TRACT

## 2013-03-31 MED ORDER — ONDANSETRON HCL 4 MG PO TABS: 4.0000 mg | ORAL_TABLET | Freq: Three times a day (TID) | ORAL | Status: DC | PRN

## 2013-03-31 MED ORDER — NICOTINE 21 MG/24HR TD PT24: 21.0000 mg | MEDICATED_PATCH | Freq: Every day | TRANSDERMAL | Status: DC

## 2013-03-31 MED ORDER — ALUM & MAG HYDROXIDE-SIMETH 200-200-20 MG/5ML PO SUSP: 30.0000 mL | ORAL | Status: DC | PRN

## 2013-03-31 MED ORDER — IBUPROFEN 600 MG PO TABS: 600.0000 mg | ORAL_TABLET | Freq: Three times a day (TID) | ORAL | Status: DC | PRN

## 2013-03-31 NOTE — ED Notes (Signed)
Pt unable to provide a urine sample at this time, however is aware of the need for one.

## 2013-03-31 NOTE — [facility] Counselor (Signed)
Pt's mother Louanne Belton - [phone]. She is bringing his inhaler and cell phone charger later tonite. Writer told [name] that pt wouldn't have access to cell phone charger but it would be kept in locker.  Evette Cristal, [place] Assessment Counselor

## 2013-03-31 NOTE — [facility] Counselor (Signed)
Magistrate Lequita Halt confirmed receipt of IVC paperwork. Originals placed in IVC log and copies placed in pt's chart.  Evette Cristal, [place] Assessment Counselor

## 2013-03-31 NOTE — ED Notes (Addendum)
Requesting detox off multiple street drugs and mushrooms, been using last couple of years, "I'm tried of being broke, spending all my money on drugs" S/I without plan

## 2013-03-31 NOTE — ED Provider Notes (Signed)
History     CSN: [id number]  Arrival date & time 03/31/13  1191   First MD Initiated Contact with Patient 03/31/13 2133      Chief Complaint  Patient presents with  . Detox   . Medical Clearance   HPI  History provided by the patient. Patient is a 27 year old male with past history as above asthma and depression who presents with requests for help with polysubstance abuse. The patient states that he has been in a self-destructive pattern taking multiple street drugs. He has been using cocaine and multiple formats, mushrooms and pills. He is also occasionally been using alcohol though states this is not his drug of choice. Today he has had increased depression over his behavior and his lifestyle. He is hoping for help with his addiction. He states he has had some thoughts of SI but currently does not have any. Denies any HI. He denies any other complaints. No recent fevers. No chest pains abdominal pain diarrhea constipation symptoms.     Past Medical History  Diagnosis Date  . Asthma   . Mental disorder   . Depression     History reviewed. No pertinent past surgical history.  Family History  Problem Relation Age of Onset  . Asthma Father   . Alcohol abuse Father   . Asthma Brother     History  Substance Use Topics  . Smoking status: Former Smoker -- 1.00 packs/day    Quit date: 11/04/2011  . Smokeless tobacco: Never Used  . Alcohol Use: 0.6 oz/week    1 Cans of beer per week      Review of Systems  Constitutional: Negative for fever.  Psychiatric/Behavioral: Positive for suicidal ideas and dysphoric mood. Negative for self-injury.  All other systems reviewed and are negative.    Allergies  Uncoded nonscreenable allergen  Home Medications   Current Outpatient Rx  Name  Route  Sig  Dispense  Refill  . albuterol (PROVENTIL HFA;VENTOLIN HFA) 108 (90 BASE) MCG/ACT inhaler   Inhalation   Inhale 2 puffs into the lungs every 6 (six) hours as needed. For shortness  of breath or wheezing           BP 121/73  Pulse 89  Temp(Src) 98.4 F (36.9 C) (Oral)  Resp 22  SpO2 95%  Physical Exam  Nursing note and vitals reviewed. Constitutional: He is oriented to person, place, and time. He appears well-developed and well-nourished. No distress.  HENT:  Head: Normocephalic.  Cardiovascular: Normal rate and regular rhythm.   Pulmonary/Chest: Effort normal and breath sounds normal.  Abdominal: Soft.  Musculoskeletal: Normal range of motion.  Neurological: He is alert and oriented to person, place, and time.  Skin: Skin is warm.  Psychiatric: His behavior is normal. He exhibits a depressed mood. He expresses no homicidal and no suicidal ideation.    ED Course  Procedures   Results for orders placed during the hospital encounter of 03/31/13  ACETAMINOPHEN LEVEL      Result Value Range   Acetaminophen (Tylenol), Serum <15.0  10 - 30 ug/mL  CBC      Result Value Range   WBC 8.0  4.0 - 10.5 K/uL   RBC 5.24  4.22 - 5.81 MIL/uL   Hemoglobin 15.8  13.0 - 17.0 g/dL   HCT 47.8  29.5 - 62.1 %   MCV 84.5  78.0 - 100.0 fL   MCH 30.2  26.0 - 34.0 pg   MCHC 35.7  30.0 - 36.0 g/dL  RDW 12.5  11.5 - 15.5 %   Platelets 271  150 - 400 K/uL  COMPREHENSIVE METABOLIC PANEL      Result Value Range   Sodium 141  135 - 145 mEq/L   Potassium 4.3  3.5 - 5.1 mEq/L   Chloride 106  96 - 112 mEq/L   CO2 21  19 - 32 mEq/L   Glucose, Bld 99  70 - 99 mg/dL   BUN 8  6 - 23 mg/dL   Creatinine, Ser 1.61  0.50 - 1.35 mg/dL   Calcium 9.3  8.4 - 09.6 mg/dL   Total Protein 7.5  6.0 - 8.3 g/dL   Albumin 4.2  3.5 - 5.2 g/dL   AST 18  0 - 37 U/L   ALT 10  0 - 53 U/L   Alkaline Phosphatase 69  39 - 117 U/L   Total Bilirubin 0.3  0.3 - 1.2 mg/dL   GFR calc non Af Amer >90  >90 mL/min   GFR calc Af Amer >90  >90 mL/min  ETHANOL      Result Value Range   Alcohol, Ethyl (B) 78 (*) 0 - 11 mg/dL  SALICYLATE LEVEL      Result Value Range   Salicylate Lvl <2.0 (*) 2.8 - 20.0  mg/dL       1. Polysubstance abuse       MDM  patient seen and evaluated. The patient well-appearing in no acute distress. Patient requesting help with polysubstance abuse.  We'll consult a [facility] act team.  Act team will work on placement for the patient. Holding orders in place.      Angus Seller, PA-C 04/01/13 [phone]

## 2013-04-01 ENCOUNTER — Inpatient Hospital Stay (HOSPITAL_COMMUNITY)
Admission: AD | Admit: 2013-04-01 | Discharge: 2013-04-04 | DRG: 897 | Disposition: A | Discharge status: Home / Self Care | Payer: Self-pay | Source: Ambulatory Visit | Attending: Psychiatry | Admitting: Psychiatry

## 2013-04-01 ENCOUNTER — Encounter (HOSPITAL_COMMUNITY): Payer: Self-pay

## 2013-04-01 DIAGNOSIS — F1920 Other psychoactive substance dependence, uncomplicated: Secondary | ICD-10-CM

## 2013-04-01 DIAGNOSIS — J45909 Unspecified asthma, uncomplicated: Secondary | ICD-10-CM | POA: Diagnosis present

## 2013-04-01 DIAGNOSIS — F1910 Other psychoactive substance abuse, uncomplicated: Principal | ICD-10-CM | POA: Diagnosis present

## 2013-04-01 DIAGNOSIS — R45851 Suicidal ideations: Secondary | ICD-10-CM

## 2013-04-01 DIAGNOSIS — F1994 Other psychoactive substance use, unspecified with psychoactive substance-induced mood disorder: Secondary | ICD-10-CM

## 2013-04-01 DIAGNOSIS — F3160 Bipolar disorder, current episode mixed, unspecified: Secondary | ICD-10-CM

## 2013-04-01 DIAGNOSIS — Z79899 Other long term (current) drug therapy: Secondary | ICD-10-CM

## 2013-04-01 DIAGNOSIS — F29 Unspecified psychosis not due to a substance or known physiological condition: Secondary | ICD-10-CM

## 2013-04-01 DIAGNOSIS — J45901 Unspecified asthma with (acute) exacerbation: Secondary | ICD-10-CM

## 2013-04-01 MED ORDER — ALUM & MAG HYDROXIDE-SIMETH 200-200-20 MG/5ML PO SUSP: 30.0000 mL | ORAL | Status: DC | PRN

## 2013-04-01 MED ORDER — NICOTINE 14 MG/24HR TD PT24
14.0000 mg | MEDICATED_PATCH | Freq: Every day | TRANSDERMAL | Status: DC
  Administered 2013-04-02: 14 mg via TRANSDERMAL
  Filled 2013-04-01 (×5): qty 1

## 2013-04-01 MED ORDER — ACETAMINOPHEN 325 MG PO TABS: 650.0000 mg | ORAL_TABLET | Freq: Four times a day (QID) | ORAL | Status: DC | PRN

## 2013-04-01 MED ORDER — HYDROXYZINE HCL 25 MG PO TABS
25.0000 mg | ORAL_TABLET | Freq: Four times a day (QID) | ORAL | Status: DC | PRN
  Filled 2013-04-01: qty 1

## 2013-04-01 MED ORDER — MAGNESIUM HYDROXIDE 400 MG/5ML PO SUSP: 30.0000 mL | Freq: Every day | ORAL | Status: DC | PRN

## 2013-04-01 MED ORDER — THIAMINE HCL 100 MG/ML IJ SOLN: 100.0000 mg | Freq: Once | INTRAMUSCULAR | Status: DC

## 2013-04-01 MED ORDER — HYDROXYZINE HCL 50 MG PO TABS
50.0000 mg | ORAL_TABLET | Freq: Four times a day (QID) | ORAL | Status: AC | PRN
  Administered 2013-04-01: 50 mg via ORAL
  Filled 2013-04-01 (×3): qty 1

## 2013-04-01 MED ORDER — ZIPRASIDONE MESYLATE 20 MG IM SOLR: 10.0000 mg | Freq: Four times a day (QID) | INTRAMUSCULAR | Status: DC | PRN

## 2013-04-01 MED ORDER — IBUPROFEN 600 MG PO TABS: 600.0000 mg | ORAL_TABLET | Freq: Three times a day (TID) | ORAL | Status: DC | PRN

## 2013-04-01 MED ORDER — ALBUTEROL SULFATE HFA 108 (90 BASE) MCG/ACT IN AERS
2.0000 | INHALATION_SPRAY | Freq: Four times a day (QID) | RESPIRATORY_TRACT | Status: DC | PRN
Start: 2013-04-01 — End: 2013-04-04

## 2013-04-01 MED ORDER — LOPERAMIDE HCL 2 MG PO CAPS: 2.0000 mg | ORAL_CAPSULE | ORAL | Status: AC | PRN

## 2013-04-01 MED ORDER — ZIPRASIDONE MESYLATE 20 MG IM SOLR
INTRAMUSCULAR | Status: AC
  Filled 2013-04-01: qty 20

## 2013-04-01 MED ORDER — ONDANSETRON HCL 4 MG PO TABS: 4.0000 mg | ORAL_TABLET | Freq: Three times a day (TID) | ORAL | Status: DC | PRN

## 2013-04-01 MED ORDER — NICOTINE 21 MG/24HR TD PT24
21.0000 mg | MEDICATED_PATCH | Freq: Every day | TRANSDERMAL | Status: DC
  Filled 2013-04-01 (×4): qty 1

## 2013-04-01 MED ORDER — ADULT MULTIVITAMIN W/MINERALS CH
1.0000 | ORAL_TABLET | Freq: Every day | ORAL | Status: DC
  Administered 2013-04-02 – 2013-04-04 (×2): 1 via ORAL
  Filled 2013-04-01 (×7): qty 1

## 2013-04-01 MED ORDER — TRAZODONE HCL 50 MG PO TABS
50.0000 mg | ORAL_TABLET | Freq: Every evening | ORAL | Status: DC | PRN
  Administered 2013-04-01 – 2013-04-03 (×3): 50 mg via ORAL
  Filled 2013-04-01 (×3): qty 1
  Filled 2013-04-01: qty 28

## 2013-04-01 MED ORDER — ONDANSETRON 4 MG PO TBDP: 4.0000 mg | ORAL_TABLET | Freq: Four times a day (QID) | ORAL | Status: AC | PRN

## 2013-04-01 MED ORDER — VITAMIN B-1 100 MG PO TABS
100.0000 mg | ORAL_TABLET | Freq: Every day | ORAL | Status: DC
  Administered 2013-04-02 – 2013-04-04 (×2): 100 mg via ORAL
  Filled 2013-04-01 (×5): qty 1

## 2013-04-01 MED ORDER — CHLORDIAZEPOXIDE HCL 25 MG PO CAPS
25.0000 mg | ORAL_CAPSULE | Freq: Four times a day (QID) | ORAL | Status: AC | PRN
  Filled 2013-04-01: qty 1

## 2013-04-01 NOTE — Progress Notes (Signed)
1:1 progress note-see shadow chart.  Spent 1:1 time with pt talking about the circumstances of his admission.  Pt opened up and talked about his cousin's death and the guilt he feels.  Encouraged pt to try to express his feeling in a positive manner and celebrate his cousin's life, not death.  Pt tearful while talking, but receptive to writer's conversation.  Pt agrees to take medication to help him sleep tonight.  Ptr continues to be monitored 1:1 for his impulsive behaviors and anger.  Pt is safe at this time.

## 2013-04-01 NOTE — Tx Team (Signed)
Interdisciplinary Treatment Plan Update (Adult)  Date: 04/01/2013  Time Reviewed:  9:45 AM  Progress in Treatment: Attending groups: Yes Participating in groups:  Yes Taking medication as prescribed:  Yes Tolerating medication:  Yes Family/Significant othe contact made: CSW assessing  Patient understands diagnosis:  Yes Discussing patient identified problems/goals with staff:  Yes Medical problems stabilized or resolved:  Yes Denies suicidal/homicidal ideation: Yes Issues/concerns per patient self-inventory:  Yes Other:  New problem(s) identified: N/A  Discharge Plan or Barriers: CSW assessing for appropriate referrals.  Reason for Continuation of Hospitalization: Anxiety Depression Medication Stabilization  Comments: N/A  Estimated length of stay: 1-2 days  For review of initial/current patient goals, please see plan of care.  Attendees: Patient:     Family:     Physician:  Dr. Javier Glazier 04/01/2013 11:24 AM   Nursing:   Quintella Reichert, RN 04/01/2013 11:24 AM   Clinical Social Worker:  Reyes Ivan, LCSWA 04/01/2013 11:24 AM   Other:Andrea Jacinto Reap, RN  04/01/2013 11:24 AM   Other:  Frankey Shown, MA care coordination 04/01/2013 11:24 AM   Other:   04/01/2013 11:24 AM   Other:     Other:    Other:    Other:    Other:    Other:    Other:     Scribe for Treatment Team:   Carmina Miller, 04/01/2013 11:24 AM
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
## 2013-04-01 NOTE — [facility] Suicide Risk Assessment (Signed)
[facility] INPATIENT:  Family/Significant Other Suicide Prevention Education  Suicide Prevention Education:  Education Completed; [name] Hall - mother [phone]),  (name of family member/significant other) has been identified by the patient as the family member/significant other with whom the patient will be residing, and identified as the person(s) who will aid the patient in the event of a mental health crisis (suicidal ideations/suicide attempt).  With written consent from the patient, the family member/significant other has been provided the following suicide prevention education, prior to the and/or following the discharge of the patient.  The suicide prevention education provided includes the following:  Suicide risk factors  Suicide prevention and interventions  National Suicide Hotline telephone number  [facility] assessment telephone number  [facility] Emergency Assistance 911  [facility] and/or Residential Mobile Crisis Unit telephone number  Request made of family/significant other to:  Remove weapons (e.g., guns, rifles, knives), all items previously/currently identified as safety concern.    Remove drugs/medications (over-the-counter, prescriptions, illicit drugs), all items previously/currently identified as a safety concern.  The family member/significant other verbalizes understanding of the suicide prevention education information provided.  The family member/significant other agrees to remove the items of safety concern listed above.  [name] Hall is concerned that if pt would kill himself or others.  [name] Hall states that she will sign him in involuntary if we try to d/c him today.  [name] Hall states that pt is homeless and has no good d/c plan.  Pt is not stable to d/c at this time, in CSW's opinions.    [name] Hall 04/01/2013, 12:18 PM

## 2013-04-01 NOTE — Progress Notes (Signed)
Pt woke up agitated, cussing, sweating, pacing the hall and threatening that "if I want to leave I will go right through that window down there and there ain't anyone here big enough to stop me.  I'm an Hydrographic surveyor.  I mean what are y'all gonna do?"  Charge RN notified, verbally de-escalated patient.  Pt now in room quiet in bed.

## 2013-04-01 NOTE — Progress Notes (Signed)
Adult Psychoeducational Group Note  Date:  04/01/2013 Time:  8:00PM Group Topic/Focus:  Wrap-Up Group:   The focus of this group is to help patients review their daily goal of treatment and discuss progress on daily workbooks.  Participation Level:  Did Not Attend   Additional Comments: Pt. Didn't attend group.   Bing Plume D 04/01/2013, 9:35 PM

## 2013-04-01 NOTE — Progress Notes (Signed)
Adult Psychoeducational Group Note  Date:  04/01/2013 Time:  11:20AM  Group Topic/Focus:  Self Care:   The focus of this group is to help patients understand the importance of self-care in order to improve or restore emotional, physical, spiritual, interpersonal, and financial health.  Participation Level:  Did Not Attend  Additional Comments:  Pt did not attend psycho-educational group with peers on hall   Marlowe Aschoff 04/01/2013, 12:36 PM
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
## 2013-04-01 NOTE — Progress Notes (Signed)
1:1 Note-continued 1:1 observation for pt safety, pt is calm and in bed at this time but with not contract that he will not try to break out of the window.  Pt denies any pain or discomfort but is stating that he plans to not eat or drink anything while he is in here.

## 2013-04-01 NOTE — [facility] Group Notes (Signed)
[facility] LCSW Aftercare Discharge Planning Group Note  04/01/2013 8:45 AM   Participation Quality:  Did Not Attend  Kear[place] Evitt Horton, LCSWA  04/01/2013 9:35 AM    

## 2013-04-01 NOTE — [facility] Group Notes (Signed)
[facility] LCSW Group Therapy  04/01/2013  1:15 PM   Type of Therapy:  Group Therapy  Participation Level:  Did Not Attend  Reyes Ivan, LCSWA 04/01/2013 3:04 PM

## 2013-04-01 NOTE — Clinical Social Work Note (Signed)
CSW met with pt individually at this time to complete the PSA.  Pt states that whether we d/c him or not today, he will be leaving, if he has to break the glass on the hall and jump out the window.  Pt is irrational and voices no d/c plan.  Pt states that he was staying with his father in [place] but will not go back there because he will continue to use.  Pt states that he could go to [facility] and be admitted to [facility] for detox, which CSW explained is not an option, since he is already here.  Pt states that he rather detox there because [facility] reminds him of his cousin that committed suicide 2 years ago.  CSW discussed [facility] and [facility] as great options for pt, in which pt states he will think about it.  Pt states that his mom will pick him up today and take him to [facility] to detox and he will follow up at [facility] in [facility] for outpatient services, which again is irrational.  CSW provided pt with information on both treatment options.    CSW spoke with pt's mother and mother does not feel pt is safe to d/c today due to thinking he will kill himself or others.  Mother states that pt has been calling her non stop today about d/c and she doesn't feel he is stable.  Mother states that pt has bipolar disorder but won't be forthcoming with that.  Mother states that she is fearful for pt to d/c due to homicidal ideation to her.  Mother states that she contacted pt's drug dealer to see how much pt was using and mother states drug dealer said most people get 1 molly a day and pt gets 3-4 molly a day.  Mother states that she will be unable to transport pt for the next 2 days due to back injections tomorrow.  Mother would like pt to stay here as long as possible.    Reyes Ivan, LCSWA 04/01/2013  12:26 PM
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
## 2013-04-01 NOTE — BHH Counselor (Signed)
Per Gabriel Cirri RN at [facility], pt has been accepted by Daleen Bo MD to [phone]. Support paperwork signed and faxed to [facility]. Originals placed in pt's chart. RN [name] notified who then notified [facility] EDP.   Evette Cristal, [place] Assessment Counselor

## 2013-04-01 NOTE — Progress Notes (Signed)
Recreation Therapy Notes  Date: 06.09.2014 Time: 3:0pm Location: [address] Dayroom       Group Topic/Focus: Decision Making, Communciation  Participation Level: Did not attend.   Marykay Lex Nahom Carfagno, LRT/CTRS  Quintasia Theroux L 04/01/2013 4:06 PM
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
## 2013-04-01 NOTE — ED Provider Notes (Signed)
Medical screening examination/treatment/procedure(s) were performed by non-physician practitioner and as supervising physician I was immediately available for consultation/collaboration.   Gola Bribiesca L Jameia Makris, MD 04/01/13 [id number] 

## 2013-04-01 NOTE — Progress Notes (Signed)
1:1 observation started for pt safety.  MD aware, Charge RN and [facility] made aware.  Pt is very agitated after being told that he will not be able to leave [facility] today.  LCSW is working on finding a place for the pt to go with no success today.  Pt threatening to break the widow and jump out and also threatening to hurt staff that try to keep him in here.
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
## 2013-04-02 DIAGNOSIS — F1910 Other psychoactive substance abuse, uncomplicated: Principal | ICD-10-CM

## 2013-04-02 DIAGNOSIS — F29 Unspecified psychosis not due to a substance or known physiological condition: Secondary | ICD-10-CM

## 2013-04-02 MED ORDER — QUETIAPINE FUMARATE 50 MG PO TABS
50.0000 mg | ORAL_TABLET | Freq: Every day | ORAL | Status: DC
  Administered 2013-04-03: 50 mg via ORAL
  Filled 2013-04-02 (×2): qty 1
  Filled 2013-04-02: qty 14
  Filled 2013-04-02: qty 1

## 2013-04-02 MED ORDER — FLUOXETINE HCL 20 MG PO CAPS
20.0000 mg | ORAL_CAPSULE | Freq: Every day | ORAL | Status: DC
  Filled 2013-04-02 (×4): qty 1
  Filled 2013-04-02: qty 14

## 2013-04-02 NOTE — Clinical Social Work Note (Signed)
Writer contacted [facility] and was advised they do not have any treatment beds today.  No information released on patient.

## 2013-04-02 NOTE — BHH Group Notes (Addendum)
BHH LCSW Group Therapy      Feelings About Diagnosis 1:15 - 2:30 PM          04/02/2013  3:18 PM   Type of Therapy:  Group Therapy  Participation Level:  Did not attend group.   Wynn Banker 04/02/2013 3:18 PM
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
## 2013-04-02 NOTE — Progress Notes (Signed)
Adult Psychoeducational Group Note  Date:  04/02/2013 Time:  12:01 PM  Group Topic/Focus:  Recovery Goals:   The focus of this group is to identify appropriate goals for recovery and establish a plan to achieve them.  Participation Level:  Did Not Attend  Participation Quality: Did Not Attend  Affect:  Did Not Attend  Cognitive:  Did Not Attend  Insight: None  Engagement in Group:  Did Not Attend  Modes of Intervention:  Did Not Attend  Additional Comments:  Pt. Is on a 1:1 for aggressive behavior.   Tora Perches N 04/02/2013, 12:01 PM

## 2013-04-02 NOTE — Progress Notes (Signed)
Adult Psychoeducational Group Note  Date:  04/02/2013 Time: 2015 Group Topic/Focus:  Wrap-Up Group:   The focus of this group is to help patients review their daily goal of treatment and discuss progress on daily workbooks.  Participation Level:  Active  Participation Quality:  Appropriate and Attentive  Affect:  Appropriate  Cognitive:  Alert and Appropriate  Insight: Appropriate and Good  Engagement in Group:  Engaged  Modes of Intervention:  Discussion and Education  Additional Comments:    [name] Hall 04/02/2013, 10:54 PM

## 2013-04-02 NOTE — Progress Notes (Addendum)
0730  Patient laying in bed with eyes closed with 1:1 present.  Respirations even and unlabored.  No signs of pain or distress noted on patient's face or body movements.  Safety maintained.  Will continue to monitor with 1:1 per MD order.  1610  Patient ate 60% of breakfast tray this morning in his room.   1:1 continues for safety.  Patient refused his morning medications when he was first asked by nurse.  Then patient decided to take his multivitamin and thiamine.  Patient has been cooperative and pleasant this morning.  Patient denied SI and HI.   Denied A/V hallucinations.  Denied pain.   Stated he is very tired, has not slept for several weeks.   Denied depression and hopelessness but does have some anxiety.  Patient has been up to bathroom this morning.  Mother called and asked about her son this morning, stated she is going for medical procedure today.  Explained to patient's mother that he is better today, discussed his breakfast and medications.  Mother stated he only has her to help him.  Family members and friends no longer want to have anything to do with him.  Patient continues to rest in bed with eyes closed.  1:1 continues.  No signs of pain/distress noted on patient's face or body movements.  Respirations even and unlabored.    0945  Patient's self inventory sheet, patient sleeps fair, improving appetite, low energy level, improving attention span.  Rated depression and hopelessness #4.  Denied withdrawals.  SI, contracts for safety.  Had pain and anger yesterday.  Worst pain #2.  "Do not think I should go home."  No questions for staff.  No discharge plans.  No problems taking meds after discharge.  Patient denied SI and HI this morning while talking to nurse.  This self inventory was filled out before talking to nurse.     1205  MD discontinued 1:1 on patient since he is doing better today.  Patient had been talking to mom on phone, came back to his room and returned to his bed.  Presently  laying in bed with eyes closed.  Patient denied SI and HI.   Denied A/V hallucinations.  Denied pain.  Patient resting in bed with eyes closed.  Respirations even and unlabored.  No signs of pain or distress noted on patient's face or body movements.  Safety maintained.  1405  Patient presently sleeping in bed.  No signs of pain/distress noted on patient's face or body movements.  Respirations even and unlabored.  Patient ate approximately 50% of lunch.  No complaints of pain or discomfort. Will continue to monitor patient for safety with 15 minute checks.  [id number]  Patient has been in bed most of day sleeping.  Has been up in hallway approximately 1545, using phone several times.  Declined his afternoon medication prozac 20 mg, stated he did not want to take this medication.  No signs of pain/distress noted on patient's face or body movements.  Respirations even and unlabored.  Denied SI and HI.   Denied A/V hallucinations.  Denied pain.  1805  Patient has been appropriate, cooperative, no outbursts of anger or aggression.  No signs of pain or distress noted on patient's face or body movements.  Respirations even and unlabored.  Denied SI and HI.   Denied A/V hallucinations.   Denied pain.   Went to dining room for dinner.  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  Wants to discharge to treatment center.  Knows he will relapse if he returns home.

## 2013-04-02 NOTE — Progress Notes (Signed)
Recreation Therapy Notes  Date: 06.10.2014 Time: 2:45pm Location: [address] Dayroom      Group Topic/Focus: Musician (AAA/T)  Participation Level: Did not attend  [employer], LRT/CTRS  Jearl Klinefelter 04/02/2013 4:29 PM

## 2013-04-02 NOTE — BHH Group Notes (Signed)
[facility] LCSW Aftercare Discharge Planning Group Note   04/02/2013 10:43 AM  Participation Quality:  Did not attend group.  [name] Hall, [name] Hall

## 2013-04-02 NOTE — Progress Notes (Signed)
Pt observed in the dayroom interacting with his peers.  He says he is feeling better and not having any suicidal thoughts at this time.  Per first shift report, pt has been more cooperative/polite with staff.  He say he needs to go to substance abuse treatment from [facility] because he will just relapse if he goes home.  He says the trailer he lives in on his mother's property is infested with bugs and rats, and it just depresses him to think of going back there.  Pt's thoughts appear to be clear and logical.  He denies SI/HI/AV.  He voices no needs or concerns at this time.  Support and encouragement offered.  Safety maintained with q15 minute checks.

## 2013-04-03 NOTE — Progress Notes (Signed)
Adult Psychoeducational Group Note  Date:  04/03/2013 Time:  8:00PM Group Topic/Focus:  Wrap-Up Group:   The focus of this group is to help patients review their daily goal of treatment and discuss progress on daily workbooks.  Participation Level:  Active  Participation Quality:  Appropriate and Attentive  Affect:  Appropriate  Cognitive:  Alert and Appropriate  Insight: Appropriate  Engagement in Group:  Engaged  Modes of Intervention:  Discussion  Additional Comments:  Pt. Was attentive and appropriate during tonight's group discussion. Pt stated that his day was better than yesterday. Pt still is having a hard time communicating with his mother. Pt enjoyed visit from mom and learning how to deal with issues from the past.   April Manson 04/03/2013, 9:13 PM

## 2013-04-03 NOTE — Progress Notes (Signed)
Patient ID: [name] Hall, male   DOB: Dec 01, 1985, 27 y.o.   MRN: [id number]  DAR- Update note-11pm-7am  D: Pt observed sleeping in bed with eyes closed. RR even and unlabored. No distress noted  .  A: Q 15 minute checks were done for safety.  R: safety maintained on unit.

## 2013-04-03 NOTE — Progress Notes (Signed)
Patient ID: [name] Hall, male   DOB: Jun 08, 1986, 27 y.o.   MRN: [id number] D: Patient rated as 0 and anxiety as 2 on a 0-10 scale. Pt stated he is worried about being discharged and not having a safe place to go. Pt reported he did not want to go back to the environment he came from because fear of relapse. Pt states if he goes to a treatment center from here it will help him stay on track. Pt denies SI/HI/AVH and pain. Pt attended evening wrap up group and engage in discussion. Pt denies any needs or concerns.  Cooperative with assessment. No acute distressed noted at this time.   A: Met with pt 1:1. Medications administered as prescribed. Writer encouraged pt to discuss feelings. Pt encouraged to come to staff with any question or concerns. 15 minutes checks for safety.  R: Patient remains safe. He is complaint with medications and denies any adverse reaction. Continue current POC.

## 2013-04-03 NOTE — Clinical Social Work Note (Signed)
Writer spoke with patient's mother twice this morning.  She is expressing concerns that patient needs to go into a residential treatment center at discharge.  She stated patient is threatening to kill himself if he does not get into a program.  Patient met with treatment team and denies SI/HI.  He advised he is high risk for relapse and states he will use if discharge but does not endorse SI/HI.  Patient also states if discharged, he will not make the daily call to check with [facility] to see if he has a bed.  Patient and mother informed we can not keep patient in the hospital to wait for a treatment bed.  Patient has been accepted for admission to [facility] on April 11, 2013.
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
## 2013-04-03 NOTE — Progress Notes (Signed)
D: Patient appropriate and cooperative with staff and peers. Patient's affect/mood is anxious. He reported on the self inventory sheet that his sleep is fair, appetite/ability to pay attention is improving and energy level is low. Patient rated depression "2" and feelings of hopelessness "3". He verbalized that he's interested in going to a treatment center, but specifically said that he didn't want to go to one in [place], [place], however later he wanted more clarification of his options; the social worker spoke with patient and informed him about available treatment facilities. Patient is attending groups, but non-compliant with morning medications.  A: Support and encouragement provided to patient. Offered the patient several times the opportunity to take morning medications. Monitor Q15 minute checks for safety.  R: Patient receptive. Denies SI/HI/AVH. Patient remains safe on the unit.

## 2013-04-03 NOTE — Progress Notes (Signed)
Recreation Therapy Notes  Date: 06.11.2014  Time: 3:00pm  Location: [address] Day Room  Group Topic/Focus: Problem Solving, Communication, Team Work  Participation Level:  Active  Participation Quality:  Appropriate  Affect:  Euthymic  Cognitive:  Appropriate  Additional Comments: Activity: Theme park manager ; Explanation: Patients were given the following objects: 10 paper clips, 5 rubber bands, 1 uninflated balloon, 1 roll of masking tape, 2 index cards, 3 sheets of paper, 2 paper cups, 3 ping pong balls. LRT marked two lines on the floor using masking tape 10 feet from each other. Patients were instructed to build a contraption out of the supplies provided to launch the ping pong balls across the line on the opposite side of the room.   Patient actively participated in group activity. Patient offered suggestions to group for building the contraption. Patient offered encouragement to peers when needed. Patient listened to wrap up discussion about the skills needed to complete the task requested and how they can be used post d/c.  Marykay Lex Nadeem Romanoski, LRT/CTRS  Jearl Klinefelter 04/03/2013 4:24 PM

## 2013-04-03 NOTE — B[facility] Group Notes (Signed)
Adult Psychoeducational Group Note  Date:  04/03/2013 Time:  11:40 AM  Group Topic/Focus: Values and Personal Goals Personal Choices and Values:   The focus of this group is to help patients assess and explore the importance of values in their lives, how their values affect their decisions, how they express their values and what opposes their expression.  Participation Level:  Active  Participation Quality:  Appropriate  Affect:  Appropriate  Cognitive:  Appropriate  Insight: Appropriate  Engagement in Group:  Engaged  Modes of Intervention:  Activity and Discussion  Additional Comments:  [name] Hall was very active in group.  He expressed that he valued his friendships because they are a good support system for him.  [name] A 04/03/2013, 11:40 AM

## 2013-04-03 NOTE — BHH Group Notes (Signed)
[facility] LCSW Aftercare Discharge Planning Group Note   04/03/2013 11:41 AM  Participation Quality:  Appropriate  Mood/Affect:  Appropriate  Depression Rating:  2  Anxiety Rating:  2  Thoughts of Suicide:  No  Will you contract for safety?   NA  Current AVH:  No  Plan for Discharge/Comments:  Patient advised of being better today.  He shared he is interested in going into a residential treatment program and believes he will relapse if discharged to the community.  Referral made to [facility].  Transportation Means: Patient has transportation.  Supports: Patient has limited support system.  Rilen Shukla, Joesph July

## 2013-04-03 NOTE — BHH Group Notes (Signed)
[facility] LCSW Group Therapy  Emotional Regulation 1:15 - 2: 30 PM        04/03/2013  1:17 PM   Type of Therapy:  Group Therapy  Participation Level:  Appropriate  Participation Quality:  Appropriate  Affect:  Appropriate  Cognitive:  Attentive Appropriate  Insight:  Engaged  Engagement in Therapy:  Engaged  Modes of Intervention:  Discussion Exploration Problem-Solving Supportive  Summary of Progress/Problems:  Group topic was emotional regulations.  Patient participated in the discussion and was able to identify disappointment and anger as the emotion that needed to regulated.  He was able to share how these emotions effects him negatively as they result in him engaging in rage and violence.  Group worked with patient to identify positives in what appears to be a negative situation in order to get a different perspective on the situation.  Wynn Banker  04/03/2013 1:17 PM      '

## 2013-04-04 MED ORDER — ALBUTEROL SULFATE HFA 108 (90 BASE) MCG/ACT IN AERS: 2.0000 | INHALATION_SPRAY | Freq: Four times a day (QID) | RESPIRATORY_TRACT | Status: DC | PRN

## 2013-04-04 MED ORDER — FLUOXETINE HCL 20 MG PO CAPS: 20.0000 mg | ORAL_CAPSULE | Freq: Every day | ORAL | Status: DC

## 2013-04-04 MED ORDER — QUETIAPINE FUMARATE 50 MG PO TABS: 50.0000 mg | ORAL_TABLET | Freq: Every day | ORAL | Status: DC

## 2013-04-04 MED ORDER — TRAZODONE HCL 50 MG PO TABS: 50.0000 mg | ORAL_TABLET | Freq: Every evening | ORAL | Status: DC | PRN

## 2013-04-04 MED ORDER — HYDROXYZINE HCL 50 MG PO TABS: 50.0000 mg | ORAL_TABLET | Freq: Four times a day (QID) | ORAL | Status: DC | PRN

## 2013-04-04 NOTE — Progress Notes (Signed)
[facility] Adult Case Management Discharge Plan :  Will you be returning to the same living situation after discharge:. Yes, patient returning home with family. At discharge, do you have transportation home?Yes, Patient to arrange transportation home.  Do you have the ability to pay for your medications:  No, patient assisted with indigent medications. Release of information consent forms completed and in the chart;  Patient's signature needed at discharge.  Patient to Follow up at: Follow-up Information   Follow up with [facility] On 04/11/2013. (Thursday, April 11, 2013 at [place])    Contact information:   [address] [place], [place]   [address]  [phone]      Follow up with [facility] On 04/04/2013. (You are scheduled with [facility]        . Referral #)    Contact information:   [address] [place], [place]   [address]  [phone]      Patient denies SI/HI:  Patient no longer endorsing SI/HI or other thoughts of self harm.   Safety Planning and Suicide Prevention discussed: .Reviewed with all patients during discharge planning group' Wynn Banker 04/04/2013, 11:26 AM

## 2013-04-04 NOTE — Clinical Social Work Note (Signed)
Writer spoke with patient's mother.  She was informed and already aware that patient had HI towards her at time of admission.  She was advised patient is not endorsing and HI toward her at this time.  Mother advised she had visited with patient last night and he was complete different than on admission.  Mother to transport patient home around 14:00

## 2013-04-04 NOTE — Progress Notes (Addendum)
D:  Patient's self inventory sheet, patient sleeps fair, good appetite, low energy level, poor attention span.  Denied depression and hopelessness.  Rated anxiety #2.   Denied withdrawals.  Denied SI.  Denied physical problems.  Zero pain goal, denied pain.   A:  Medications administered per MD orders.  Patient declined prozac and nicotine patch this morning.   Emotional support and encouragement given patient. R:  Denied Si and HI.  Denied A/V hallucinations.  Denied pain.  Will continue to monitor patient for safety with 15 minute checks.  Safety maintained.

## 2013-04-04 NOTE — Discharge Summary (Signed)
Physician Discharge Summary Note  Patient:  [name] Hall is an 27 y.o., male MRN:  [id number] DOB:  15-Apr-1986 Patient phone:  [phone] (home)  Patient address:   [address] [place] [address],   Date of Admission:  04/01/2013 Date of Discharge: 04/04/13  Reason for Admission:  Polysubstance abuse, Suicidal gestures  Discharge Diagnoses: Active Problems:   * No active hospital problems. *  Review of Systems  Constitutional: Negative.   HENT: Negative.   Eyes: Negative.   Respiratory: Negative.   Cardiovascular: Negative.   Gastrointestinal: Negative.   Genitourinary: Negative.   Musculoskeletal: Negative.   Skin: Negative.   Neurological: Negative.   Endo/Heme/Allergies: Negative.   Psychiatric/Behavioral: Positive for depression and substance abuse. Negative for suicidal ideas, hallucinations and memory loss. The patient is nervous/anxious. The patient does not have insomnia.    Axis Diagnosis:   AXIS I:  Substance Abuse and Substance Induced Mood Disorder AXIS II:  Deferred AXIS III:   Past Medical History  Diagnosis Date  . Asthma   . Mental disorder   . Depression    AXIS IV:  economic problems, educational problems, housing problems, occupational problems, other psychosocial or environmental problems and problems related to social environment AXIS V:  61-70 mild symptoms  Level of Care:  OP  Hospital Course:  [name] Hall is an 27 y.o. male. Pt presented voluntarily to [facility] but is now under IVC. Pt is tearful and cries throughout assessment. Pt says he never had a problem with drugs until his cousin died in 08/06/2011. Pt says since that time he has been using LSD, molly, "sass", cannabis & cocaine. Pt currently endorses SI and says he has been thinking about killing himself for the past 4 days. He denies he wanted to kill his mom. He says that he became upset when his truck broke down and "it all went downhill". Pt denies [facility] and no delusions  noted. Pt was inpt once at [facility] in 2013. Pt reports he has access to guns at his father's house. He lives in camper behind father's house and is depressed that his camper is so filthy and full of bugs.Marland Kitchen He reports current withdrawal symptoms including cold sweats and a headache for past 4 days. Pt smoked 2 joints 03/31/13. He used acid approx. 5 days ago and used Molly at same time while he was at [employer]. Pt used approx. 1 gram of power cocaine 03/30/13. Pt says he has only slept 1 hr for each of past 3 nights and has barely been eating. Pt endorses one prior suicide attempt several yrs ago. He endorses depressed mood with loss of interest in usual pleasures, worthlessness, and anger.       The duration of stay was three days. The patient was seen and evaluated by the Treatment team consisting of Psychiatrist, NP-C, RN, Case Manager, and Therapist for evaluation and treatment plan with goal of stabilization upon discharge. The patient's physical and mental health problems were identified and treated appropriately. Patient was initially on a 1:1 observation due to impulsive and anger behaviors on the unit. The patient threatened to break the window in order to escape from the hospital. He eventually contracted for safety and demonstrated calmer behaviors that resulted in him being on every fifteen minute safety checks.       Multiple modalities of treatment were used including medication, individual and group therapies, unit programming, improved nutrition, physical activity, and family sessions as needed. The patient  was only prescribed albuterol inhaler prior to his admission to the hospital. On admission the patient was prescribed Prozac 20 mg po daily, Seroquel 50 mg hs, and Trazodone 50 mg at bedtime.  Patient experienced some episodes of agitation during his admission and as a result had a prn order for geodon 10 mg every six hours as needed. Patient was very anxious during the first part of his admission  admitting to "I think ahead. I'm worried about passing a drug test for a job in the future." Patient also stated "I'm surprised I'm still alive with the number of drugs I have done." Also admitted to being homeless stating "I'm living in my Dad's camper."      The symptoms of mood instability were monitored daily by evaluation by clinical provider.  The patient's mental and emotional status was evaluated by a daily self inventory completed by the patient. The patient calmed a considerable amount after being started on new medication. Patient remained very worried about relapsing on LSD and MDMA if he left the hospital before obtaining substance abuse services. Patient encouraged by treatment team to take responsibility for himself because even after treatment program there is the possibility of relapse unless patient is committed to his sobriety. Patient also concerned that every one he knows in town is a user of drugs. Patient's mother called to express concern that patient had expressed SI to her. The patient denied that he had any suicidal intent to the treatment team.       Improvement was demonstrated by declining numbers on the self assessment, improving vital signs, increased cognition, and improvement in mood, sleep, appetite as well as a reduction in physical symptoms.       The patient was evaluated and found to be stable enough for discharge and was released to home per the initial plan of treatment. Patient decided he did not want treatment at [facility] or [facility]. Patient did not want to stay at a place for two weeks. The patient was given a follow up appointment at [facility] for 04/11/13. [name] Hall's anxiety and depression were self rated to be very low on his day of discharge. He was given prescriptions for his medications and a two week supply of medications. [name] Hall was found to be physically and mentally stable for discharge.   Mental Status Exam:  For mental status exam please see mental status exam and   suicide risk assessment completed by attending physician prior to discharge.  Consults:  None  Significant Diagnostic Studies:  labs: Chem profile, CBC  Discharge Vitals:   Blood pressure 114/75, pulse 58, temperature 97.6 F (36.4 C), temperature source Oral, resp. rate 20, height 5\' 11"  (1.803 m), weight 68.947 kg (152 lb). Body mass index is 21.21 kg/(m^2). Lab Results:   No results found for this or any previous visit (from the past 72 hour(s)).  Physical Findings: AIMS: Facial and Oral Movements Muscles of Facial Expression: None, normal Lips and Perioral Area: None, normal Jaw: None, normal Tongue: None, normal,Extremity Movements Upper (arms, wrists, hands, fingers): None, normal Lower (legs, knees, ankles, toes): None, normal, Trunk Movements Neck, shoulders, hips: None, normal, Overall Severity Severity of abnormal movements (highest score from questions above): None, normal Incapacitation due to abnormal movements: None, normal Patient's awareness of abnormal movements (rate only patient's report): No Awareness, Dental Status Current problems with teeth and/or dentures?: No Does patient usually wear dentures?: No  CIWA:  CIWA-Ar Total: 0 COWS:  COWS Total Score: 2  Psychiatric Specialty  Exam: See Psychiatric Specialty Exam and Suicide Risk Assessment completed by Attending Physician prior to discharge.  Discharge destination:  Home  Is patient on multiple antipsychotic therapies at discharge:  No   Has Patient had three or more failed trials of antipsychotic monotherapy by history:  No  Recommended Plan for Multiple Antipsychotic Therapies: N/A  Discharge Orders   Future Orders Complete By Expires     Diet general  As directed         Medication List    TAKE these medications     Indication   albuterol 108 (90 BASE) MCG/ACT inhaler  Commonly known as:  PROVENTIL HFA;VENTOLIN HFA  Inhale 2 puffs into the lungs every 6 (six) hours as needed. For shortness  of breath or wheezing   Indication:  Asthma     FLUoxetine 20 MG capsule  Commonly known as:  PROZAC  Take 1 capsule (20 mg total) by mouth daily. For depression   Indication:  Depression     hydrOXYzine 50 MG tablet  Commonly known as:  ATARAX/VISTARIL  Take 1 tablet (50 mg total) by mouth every 6 (six) hours as needed for anxiety (or CIWA score </= 10).      QUEtiapine 50 MG tablet  Commonly known as:  SEROQUEL  Take 1 tablet (50 mg total) by mouth at bedtime.   Indication:  Trouble Sleeping, Manic Phase of Manic-Depression     traZODone 50 MG tablet  Commonly known as:  DESYREL  Take 1 tablet (50 mg total) by mouth at bedtime as needed and may repeat dose one time if needed for sleep or depression.   Indication:  Trouble Sleeping, Major Depressive Disorder           Follow-up Information   Follow up with [facility] On 04/11/2013. (Thursday, April 11, 2013 at [place])    Contact information:   [address] [place], [place]   [address]  [phone]      Follow up with [facility] On 04/04/2013. (You are scheduled with [facility]        . Referral #)    Contact information:   [address] [place], [place]   [address]  [phone]      Follow up with [facility]. (Patient decided against going to [facility])       Follow up with [facility] . (Patient decided against treatment at [facility])       Follow-up recommendations:  Activity:  Resume usual activities.  Diet:  Regular  Comments:   Take all your medications as prescribed by your mental healthcare provider.  Report any adverse effects and or reactions from your medicines to your outpatient provider promptly.  Patient is instructed and cautioned to not engage in alcohol and or illegal drug use while on prescription medicines.  In the event of worsening symptoms, patient is instructed to call the crisis hotline, 911 and or go to the nearest ED for appropriate evaluation and treatment of symptoms.  Follow-up with your primary care provider for  your other medical issues, concerns and or health care needs.   Total Discharge Time:  Greater than 30 minutes.  SignedFransisca Kaufmann NP-C 04/04/2013, 11:45 AM  Patient is personally met, examined, case discussed with treatment team and treatment plan made and reviewed the information documented and agree with the treatment plan.  Arley Salamone,JANARDHAHA R. 04/05/2013 8:04 PM

## 2013-04-04 NOTE — BHH Suicide Risk Assessment (Signed)
Suicide Risk Assessment  Discharge Assessment     Demographic Factors:  Male, Adolescent or young adult, Caucasian and Low socioeconomic status  Mental Status Per Nursing Assessment::   On Admission:   (denies SI)  Current Mental Status by Physician: NA  Loss Factors: Decrease in vocational status, Financial problems/change in socioeconomic status and substance abuse  Historical Factors: Impulsivity  Risk Reduction Factors:   Sense of responsibility to family, Religious beliefs about death, Employed, Living with another person, especially a relative and Positive therapeutic relationship  Continued Clinical Symptoms:  Depression:   Comorbid alcohol abuse/dependence Insomnia Recent sense of peace/wellbeing Severe Unstable or Poor Therapeutic Relationship Previous Psychiatric Diagnoses and Treatments  Cognitive Features That Contribute To Risk:  Polarized thinking    Suicide Risk:  Minimal: No identifiable suicidal ideation.  Patients presenting with no risk factors but with morbid ruminations; may be classified as minimal risk based on the severity of the depressive symptoms  Discharge Diagnoses:   AXIS I:  Substance Abuse and Substance Induced Mood Disorder AXIS II:  Deferred AXIS III:   Past Medical History  Diagnosis Date  . Asthma   . Mental disorder   . Depression    AXIS IV:  economic problems, educational problems, housing problems, occupational problems, other psychosocial or environmental problems and problems related to social environment AXIS V:  61-70 mild symptoms  Plan Of Care/Follow-up recommendations:  Activity:  as tolerated Diet:  regular  Is patient on multiple antipsychotic therapies at discharge:  No   Has Patient had three or more failed trials of antipsychotic monotherapy by history:  No  Recommended Plan for Multiple Antipsychotic Therapies: Not applicable  [name] Hall,[name] R. 04/04/2013, 12:09 PM

## 2013-04-04 NOTE — BHH Group Notes (Signed)
[facility] LCSW Aftercare Discharge Planning Group Note   04/04/2013 11:28 AM  Participation Quality:  Appropriate  Mood/Affect:  Appropriate  Depression Rating:  2  Anxiety Rating:  2  Thoughts of Suicide:  No  Will you contract for safety?   NA  Current AVH:  No  Plan for Discharge/Comments:  Patient advised he is no longer interested in residential treatment at [facility] but will follow up with [name].  Patient reports he now has a good support system and a place to live.  Transportation Means: Patient has transportation.  Supports: Patient has limited support system.  Zunairah Devers, Joesph July

## 2013-04-04 NOTE — Progress Notes (Signed)
Discharge Note:  Patient discharged home with family member.  Denied SI and HI.  Denied A/V hallucinations.   Denied pain.   Suicide prevention information given to patient and discussed with patient, who stated he understood and had no questions.  Patient received all his belongings, prescriptions, medications, belt, charger, phone, wallet, money.  Patient stated he appreciated all assistance received from staff.

## 2013-04-08 NOTE — Progress Notes (Signed)
Patient Discharge Instructions:  After Visit Summary (AVS):   Faxed to:  04/08/13 Discharge Summary Note:   Faxed to:  04/08/13 Psychiatric Admission Assessment Note:   Faxed to:  04/08/13 Suicide Risk Assessment - Discharge Assessment:   Faxed to:  04/08/13 Faxed/Sent to the Next Level Care provider:  04/08/13 Faxed to [facility] @ [phone]  Jerelene Redden, 04/08/2013, 4:07 PM

## 2013-04-17 ENCOUNTER — Emergency Department (HOSPITAL_BASED_OUTPATIENT_CLINIC_OR_DEPARTMENT_OTHER)
Admission: EM | Admit: 2013-04-17 | Discharge: 2013-04-17 | Disposition: A | Discharge status: Home / Self Care | Payer: Self-pay | Attending: Emergency Medicine | Admitting: Emergency Medicine

## 2013-04-17 ENCOUNTER — Encounter (HOSPITAL_BASED_OUTPATIENT_CLINIC_OR_DEPARTMENT_OTHER): Payer: Self-pay

## 2013-04-17 DIAGNOSIS — J45901 Unspecified asthma with (acute) exacerbation: Secondary | ICD-10-CM | POA: Insufficient documentation

## 2013-04-17 DIAGNOSIS — Z79899 Other long term (current) drug therapy: Secondary | ICD-10-CM | POA: Insufficient documentation

## 2013-04-17 DIAGNOSIS — Z87891 Personal history of nicotine dependence: Secondary | ICD-10-CM | POA: Insufficient documentation

## 2013-04-17 DIAGNOSIS — Z8659 Personal history of other mental and behavioral disorders: Secondary | ICD-10-CM | POA: Insufficient documentation

## 2013-04-17 MED ORDER — ALBUTEROL SULFATE HFA 108 (90 BASE) MCG/ACT IN AERS
2.0000 | INHALATION_SPRAY | RESPIRATORY_TRACT | Status: DC | PRN
  Administered 2013-04-17: 2 via RESPIRATORY_TRACT
  Filled 2013-04-17: qty 6.7

## 2013-04-17 MED ORDER — ALBUTEROL SULFATE (5 MG/ML) 0.5% IN NEBU: 5.0000 mg | INHALATION_SOLUTION | Freq: Once | RESPIRATORY_TRACT | Status: AC

## 2013-04-17 MED ORDER — IPRATROPIUM BROMIDE 0.02 % IN SOLN: 0.5000 mg | Freq: Once | RESPIRATORY_TRACT | Status: AC

## 2013-04-17 MED ORDER — ALBUTEROL SULFATE (5 MG/ML) 0.5% IN NEBU
INHALATION_SOLUTION | RESPIRATORY_TRACT | Status: AC
  Administered 2013-04-17: 5 mg via RESPIRATORY_TRACT
  Filled 2013-04-17: qty 1

## 2013-04-17 MED ORDER — IPRATROPIUM BROMIDE 0.02 % IN SOLN
RESPIRATORY_TRACT | Status: AC
  Administered 2013-04-17: 0.5 mg via RESPIRATORY_TRACT
  Filled 2013-04-17: qty 2.5

## 2013-04-17 NOTE — ED Notes (Signed)
Pt reports chest tightness that started yesterday and awakened this morning with SHOB.

## 2013-04-17 NOTE — ED Provider Notes (Signed)
   History    CSN: [id number] Arrival date & time 04/17/13  1228  First MD Initiated Contact with Patient 04/17/13 1311     Chief Complaint  Patient presents with  . Shortness of Breath   (Consider location/radiation/quality/duration/timing/severity/associated sxs/prior Treatment) Patient is a 27 y.o. male presenting with shortness of breath.  Shortness of Breath  Pt with history of asthma reports SOB and wheezing since yesterday, associated with tightness and relieved with neb given here prior to my evaluation. He does not have a home inhaler, recently ran out. No fever, minimal cough. States he was advised by work that he would need a note clearing him to return.   Past Medical History  Diagnosis Date  . Asthma   . Mental disorder   . Depression    History reviewed. No pertinent past surgical history. Family History  Problem Relation Age of Onset  . Asthma Father   . Alcohol abuse Father   . Asthma Brother    History  Substance Use Topics  . Smoking status: Former Smoker -- 1.00 packs/day    Quit date: 11/04/2011  . Smokeless tobacco: Never Used  . Alcohol Use: 0.6 oz/week    1 Cans of beer per week    Review of Systems  Respiratory: Positive for shortness of breath.    All other systems reviewed and are negative except as noted in HPI.   Allergies  Uncoded nonscreenable allergen  Home Medications   Current Outpatient Rx  Name  Route  Sig  Dispense  Refill  . albuterol (PROVENTIL HFA;VENTOLIN HFA) 108 (90 BASE) MCG/ACT inhaler   Inhalation   Inhale 2 puffs into the lungs every 6 (six) hours as needed. For shortness of breath or wheezing          BP 136/86  Pulse 95  Temp(Src) 98.2 F (36.8 C) (Oral)  Resp 22  Ht 5\' 11"  (1.803 m)  Wt 170 lb (77.111 kg)  BMI 23.72 kg/m2  SpO2 98% Physical Exam  Nursing note and vitals reviewed. Constitutional: He is oriented to person, place, and time. He appears well-developed and well-nourished.  HENT:  Head:  Normocephalic and atraumatic.  Eyes: EOM are normal. Pupils are equal, round, and reactive to light.  Neck: Normal range of motion. Neck supple.  Cardiovascular: Normal rate, normal heart sounds and intact distal pulses.   Pulmonary/Chest: Effort normal and breath sounds normal.  Abdominal: Bowel sounds are normal. He exhibits no distension. There is no tenderness.  Musculoskeletal: Normal range of motion. He exhibits no edema and no tenderness.  Neurological: He is alert and oriented to person, place, and time. He has normal strength. No cranial nerve deficit or sensory deficit.  Skin: Skin is warm and dry. No rash noted.  Psychiatric: He has a normal mood and affect.    ED Course  Procedures (including critical care time) Labs Reviewed - No data to display No results found. 1. Asthma attack     MDM  Symptoms resolved with neb given on arrival. Will give him an HFA to take home with him. Referral to primary care.   Vinisha Faxon B. Bernette Mayers, MD 04/17/13 1330

## 2013-05-20 ENCOUNTER — Emergency Department (HOSPITAL_BASED_OUTPATIENT_CLINIC_OR_DEPARTMENT_OTHER)
Admission: EM | Admit: 2013-05-20 | Discharge: 2013-05-20 | Disposition: A | Discharge status: Home / Self Care | Payer: Self-pay | Attending: Emergency Medicine | Admitting: Emergency Medicine

## 2013-05-20 ENCOUNTER — Encounter (HOSPITAL_BASED_OUTPATIENT_CLINIC_OR_DEPARTMENT_OTHER): Payer: Self-pay | Admitting: Emergency Medicine

## 2013-05-20 DIAGNOSIS — J029 Acute pharyngitis, unspecified: Secondary | ICD-10-CM | POA: Insufficient documentation

## 2013-05-20 DIAGNOSIS — R6883 Chills (without fever): Secondary | ICD-10-CM | POA: Insufficient documentation

## 2013-05-20 DIAGNOSIS — R112 Nausea with vomiting, unspecified: Secondary | ICD-10-CM | POA: Insufficient documentation

## 2013-05-20 DIAGNOSIS — R197 Diarrhea, unspecified: Secondary | ICD-10-CM | POA: Insufficient documentation

## 2013-05-20 DIAGNOSIS — Z87891 Personal history of nicotine dependence: Secondary | ICD-10-CM | POA: Insufficient documentation

## 2013-05-20 DIAGNOSIS — Z8659 Personal history of other mental and behavioral disorders: Secondary | ICD-10-CM | POA: Insufficient documentation

## 2013-05-20 DIAGNOSIS — J45909 Unspecified asthma, uncomplicated: Secondary | ICD-10-CM | POA: Insufficient documentation

## 2013-05-20 LAB — COMPREHENSIVE METABOLIC PANEL
AST: 20 U/L (ref 0–37)
BUN: 10 mg/dL (ref 6–23)
CO2: 27 mEq/L (ref 19–32)
Calcium: 9.6 mg/dL (ref 8.4–10.5)
Chloride: 105 mEq/L (ref 96–112)
Creatinine, Ser: 0.8 mg/dL (ref 0.50–1.35)
GFR calc Af Amer: >90 mL/min (ref >90)
GFR calc non Af Amer: >90 mL/min (ref >90)
Glucose, Bld: 102 mg/dL — ABNORMAL HIGH (ref 70–99)
Total Bilirubin: 0.4 mg/dL (ref 0.3–1.2)

## 2013-05-20 LAB — CBC WITH DIFFERENTIAL/PLATELET
Basophils Absolute: 0 10*3/uL (ref 0.0–0.1)
Lymphocytes Relative: 24 % (ref 12–46)
Lymphs Abs: 1.3 10*3/uL (ref 0.7–4.0)
Neutro Abs: 3.1 10*3/uL (ref 1.7–7.7)
Neutrophils Relative %: 57 % (ref 43–77)
Platelets: 218 10*3/uL (ref 150–400)
RBC: 4.88 MIL/uL (ref 4.22–5.81)
RDW: 12.8 % (ref 11.5–15.5)
WBC: 5.5 10*3/uL (ref 4.0–10.5)

## 2013-05-20 LAB — URINALYSIS, ROUTINE W REFLEX MICROSCOPIC
Bilirubin Urine: NEGATIVE
Glucose, UA: NEGATIVE mg/dL
Hgb urine dipstick: NEGATIVE
Protein, ur: NEGATIVE mg/dL
Specific Gravity, Urine: 1.028 (ref 1.005–1.030)
Urobilinogen, UA: 1 mg/dL (ref 0.0–1.0)

## 2013-05-20 LAB — RAPID URINE DRUG SCREEN, HOSP PERFORMED
Amphetamines: NOT DETECTED
Cocaine: NOT DETECTED
Opiates: NOT DETECTED
Tetrahydrocannabinol: POSITIVE — AB

## 2013-05-20 LAB — LIPASE, BLOOD: Lipase: 14 U/L (ref 11–59)

## 2013-05-20 MED ORDER — SODIUM CHLORIDE 0.9 % IV BOLUS (SEPSIS)
2000.0000 mL | Freq: Once | INTRAVENOUS | Status: AC
  Administered 2013-05-20 (×2): 1000 mL via INTRAVENOUS

## 2013-05-20 MED ORDER — PANTOPRAZOLE SODIUM 40 MG IV SOLR
40.0000 mg | Freq: Once | INTRAVENOUS | Status: AC
  Administered 2013-05-20: 40 mg via INTRAVENOUS
  Filled 2013-05-20: qty 40

## 2013-05-20 MED ORDER — ONDANSETRON HCL 8 MG PO TABS: 8.0000 mg | ORAL_TABLET | Freq: Three times a day (TID) | ORAL | Status: DC | PRN

## 2013-05-20 MED ORDER — ONDANSETRON HCL 4 MG/2ML IJ SOLN
4.0000 mg | Freq: Once | INTRAMUSCULAR | Status: AC
  Administered 2013-05-20: 4 mg via INTRAVENOUS
  Filled 2013-05-20: qty 2

## 2013-05-20 NOTE — ED Notes (Signed)
MD at bedside. 

## 2013-05-20 NOTE — ED Provider Notes (Signed)
Complains of sore throat, vomiting and diarrhea onset approximately the middle of last week becoming worse last night. Vomited therefore times since last night with 3 or 4 episodes of diarrhea. No blood per no hematemesis. He has no history of abdominal surgeries. On exam no distress. Oropharynx is clear neck membranes moist anicteric. Abdomen nondistended normal active bowel sounds minimal tenderness at epigastrium no guarding rigidity or rebound. 7:55 AM feels improved after treatment provided. Results for orders placed during the hospital encounter of 05/20/13  COMPREHENSIVE METABOLIC PANEL      Result Value Range   Sodium 142  135 - 145 mEq/L   Potassium 4.3  3.5 - 5.1 mEq/L   Chloride 105  96 - 112 mEq/L   CO2 27  19 - 32 mEq/L   Glucose, Bld 102 (*) 70 - 99 mg/dL   BUN 10  6 - 23 mg/dL   Creatinine, Ser 1.61  0.50 - 1.35 mg/dL   Calcium 9.6  8.4 - 09.6 mg/dL   Total Protein 6.5  6.0 - 8.3 g/dL   Albumin 3.8  3.5 - 5.2 g/dL   AST 20  0 - 37 U/L   ALT 18  0 - 53 U/L   Alkaline Phosphatase 56  39 - 117 U/L   Total Bilirubin 0.4  0.3 - 1.2 mg/dL   GFR calc non Af Amer >90  >90 mL/min   GFR calc Af Amer >90  >90 mL/min  LIPASE, BLOOD      Result Value Range   Lipase 14  11 - 59 U/L  CBC WITH DIFFERENTIAL      Result Value Range   WBC 5.5  4.0 - 10.5 K/uL   RBC 4.88  4.22 - 5.81 MIL/uL   Hemoglobin 14.9  13.0 - 17.0 g/dL   HCT 04.5  40.9 - 81.1 %   MCV 88.1  78.0 - 100.0 fL   MCH 30.5  26.0 - 34.0 pg   MCHC 34.7  30.0 - 36.0 g/dL   RDW 91.4  78.2 - 95.6 %   Platelets 218  150 - 400 K/uL   Neutrophils Relative % 57  43 - 77 %   Neutro Abs 3.1  1.7 - 7.7 K/uL   Lymphocytes Relative 24  12 - 46 %   Lymphs Abs 1.3  0.7 - 4.0 K/uL   Monocytes Relative 10  3 - 12 %   Monocytes Absolute 0.6  0.1 - 1.0 K/uL   Eosinophils Relative 8 (*) 0 - 5 %   Eosinophils Absolute 0.5  0.0 - 0.7 K/uL   Basophils Relative 1  0 - 1 %   Basophils Absolute 0.0  0.0 - 0.1 K/uL  URINE RAPID DRUG  SCREEN (HOSP PERFORMED)      Result Value Range   Opiates NONE DETECTED  NONE DETECTED   Cocaine NONE DETECTED  NONE DETECTED   Benzodiazepines NONE DETECTED  NONE DETECTED   Amphetamines NONE DETECTED  NONE DETECTED   Tetrahydrocannabinol POSITIVE (*) NONE DETECTED   Barbiturates NONE DETECTED  NONE DETECTED  URINALYSIS, ROUTINE W REFLEX MICROSCOPIC      Result Value Range   Color, Urine AMBER (*) YELLOW   APPearance CLEAR  CLEAR   Specific Gravity, Urine 1.028  1.005 - 1.030   pH 7.0  5.0 - 8.0   Glucose, UA NEGATIVE  NEGATIVE mg/dL   Hgb urine dipstick NEGATIVE  NEGATIVE   Bilirubin Urine NEGATIVE  NEGATIVE   Ketones, ur 15 (*)  NEGATIVE mg/dL   Protein, ur NEGATIVE  NEGATIVE mg/dL   Urobilinogen, UA 1.0  0.0 - 1.0 mg/dL   Nitrite NEGATIVE  NEGATIVE   Leukocytes, UA NEGATIVE  NEGATIVE   No results found.  8:30 AM he RRR without difficulty. Suspect viral illness Plan prescription Zofran, Imodium A-D. Referral resource guide Diagnosis #1 nausea vomiting diarrhea #2 substance abuse  Doug Sou, MD 05/20/13 [phone]

## 2013-05-20 NOTE — ED Notes (Signed)
Pt reports N/V/D and chills over last 24 hrs onset x 2 days

## 2013-05-20 NOTE — ED Provider Notes (Signed)
  CSN: [id number]     Arrival date & time 05/20/13  4098 History     First MD Initiated Contact with Patient 05/20/13 [phone]     Chief Complaint  Patient presents with  . N/V/D    (Consider location/radiation/quality/duration/timing/severity/associated sxs/prior Treatment) HPI This is a 27 year old male with a history of asthma. About a week ago he was experiencing a sore throat and some epigastric discomfort. He subsequently had some nausea, vomiting and diarrhea for 2 days which then improved. His symptoms worsened yesterday and began projectile vomiting yesterday evening. He states the vomiting was severe. It has been associated with diarrhea. He is unaware of any blood in his emesis or stool. He has had some epigastric discomfort associated with this which may have been secondary to vomiting. He states his mouth is dry. He denies abdominal distention. He is not aware of having a fever but has had chills. He has not been short of breath. His knowledge his past history of substance abuse but denies any recent drug abuse (except occasional marijuana) or possibility that this represents a withdrawal syndrome. He has not taken anything to treat his symptoms.   Past Medical History  Diagnosis Date  . Asthma   . Mental disorder   . Depression    History reviewed. No pertinent past surgical history. Family History  Problem Relation Age of Onset  . Asthma Father   . Alcohol abuse Father   . Asthma Brother    History  Substance Use Topics  . Smoking status: Former Smoker -- 1.00 packs/day    Quit date: 11/04/2011  . Smokeless tobacco: Never Used  . Alcohol Use: 0.6 oz/week    1 Cans of beer per week    Review of Systems  All other systems reviewed and are negative.    Allergies  Uncoded nonscreenable allergen  Home Medications   Current Outpatient Rx  Name  Route  Sig  Dispense  Refill  . albuterol (PROVENTIL HFA;VENTOLIN HFA) 108 (90 BASE) MCG/ACT inhaler   Inhalation  Inhale 2 puffs into the lungs every 6 (six) hours as needed. For shortness of breath or wheezing          BP 122/75  Pulse 73  Temp(Src) 98 F (36.7 C) (Oral)  Resp 16  Ht 5\' 11"  (1.803 m)  Wt 170 lb (77.111 kg)  BMI 23.72 kg/m2  SpO2 97%  Physical Exam General: Well-developed, well-nourished male in no acute distress; appearance consistent with age of record HENT: normocephalic, atraumatic; dry mucous membranes Eyes: pupils equal round and reactive to light; extraocular muscles intact Neck: supple Heart: regular rate and rhythm Lungs: clear to auscultation bilaterally Abdomen: soft; nondistended; mild epigastric tenderness; no masses or hepatosplenomegaly; bowel sounds hypoactive Extremities: No deformity; full range of motion; pulses normal; no edema Neurologic: Awake, alert and oriented; motor function intact in all extremities and symmetric; no facial droop Skin: Warm and dry Psychiatric: Normal mood and affect    ED Course   Procedures (including critical care time)    MDM  7:07 AM IVF bolus initiated. Zofran given for nausea. Urine collected, blood collection pending. Discussed with Dr. Ethelda Chick who will follow up and make disposition.     Hanley Seamen, MD 05/20/13 [phone]

## 2015-01-06 ENCOUNTER — Encounter (HOSPITAL_COMMUNITY): Payer: Self-pay | Admitting: Emergency Medicine

## 2015-01-06 ENCOUNTER — Emergency Department (HOSPITAL_COMMUNITY)
Admission: EM | Admit: 2015-01-06 | Discharge: 2015-01-06 | Disposition: A | Discharge status: Home / Self Care | Payer: Self-pay | Source: Home / Self Care | Attending: Family Medicine | Admitting: Family Medicine

## 2015-01-06 DIAGNOSIS — S29012A Strain of muscle and tendon of back wall of thorax, initial encounter: Secondary | ICD-10-CM

## 2015-01-06 DIAGNOSIS — S29019A Strain of muscle and tendon of unspecified wall of thorax, initial encounter: Secondary | ICD-10-CM

## 2015-01-06 DIAGNOSIS — T148 Other injury of unspecified body region: Secondary | ICD-10-CM

## 2015-01-06 DIAGNOSIS — T148XXA Other injury of unspecified body region, initial encounter: Secondary | ICD-10-CM

## 2015-01-06 MED ORDER — METAXALONE 800 MG PO TABS: 800.0000 mg | ORAL_TABLET | Freq: Three times a day (TID) | ORAL | Status: DC

## 2015-01-06 MED ORDER — DICLOFENAC POTASSIUM 50 MG PO TABS
50.0000 mg | ORAL_TABLET | Freq: Three times a day (TID) | ORAL | Status: DC
Start: 2015-01-06 — End: 2015-12-01

## 2015-01-06 NOTE — Discharge Instructions (Signed)
Back Pain, Adult Low back pain is very common. About 1 in 5 people have back pain.The cause of low back pain is rarely dangerous. The pain often gets better over time.About half of people with a sudden onset of back pain feel better in just 2 weeks. About 8 in 10 people feel better by 6 weeks.  CAUSES Some common causes of back pain include:  Strain of the muscles or ligaments supporting the spine.  Wear and tear (degeneration) of the spinal discs.  Arthritis.  Direct injury to the back. DIAGNOSIS Most of the time, the direct cause of low back pain is not known.However, back pain can be treated effectively even when the exact cause of the pain is unknown.Answering your caregiver's questions about your overall health and symptoms is one of the most accurate ways to make sure the cause of your pain is not dangerous. If your caregiver needs more information, he or she may order lab work or imaging tests (X-rays or MRIs).However, even if imaging tests show changes in your back, this usually does not require surgery. HOME CARE INSTRUCTIONS For many people, back pain returns.Since low back pain is rarely dangerous, it is often a condition that people can learn to [facility] their own.   Remain active. It is stressful on the back to sit or stand in one place. Do not sit, drive, or stand in one place for more than 30 minutes at a time. Take short walks on level surfaces as soon as pain allows.Try to increase the length of time you walk each day.  Do not stay in bed.Resting more than 1 or 2 days can delay your recovery.  Do not avoid exercise or work.Your body is made to move.It is not dangerous to be active, even though your back may hurt.Your back will likely heal faster if you return to being active before your pain is gone.  Pay attention to your body when you bend and lift. Many people have less discomfortwhen lifting if they bend their knees, keep the load close to their bodies,and  avoid twisting. Often, the most comfortable positions are those that put less stress on your recovering back.  Find a comfortable position to sleep. Use a firm mattress and lie on your side with your knees slightly bent. If you lie on your back, put a pillow under your knees.  Only take over-the-counter or prescription medicines as directed by your caregiver. Over-the-counter medicines to reduce pain and inflammation are often the most helpful.Your caregiver may prescribe muscle relaxant drugs.These medicines help dull your pain so you can more quickly return to your normal activities and healthy exercise.  Put ice on the injured area.  Put ice in a plastic bag.  Place a towel between your skin and the bag.  Leave the ice on for 15-20 minutes, 03-04 times a day for the first 2 to 3 days. After that, ice and heat may be alternated to reduce pain and spasms.  Ask your caregiver about trying back exercises and gentle massage. This may be of some benefit.  Avoid feeling anxious or stressed.Stress increases muscle tension and can worsen back pain.It is important to recognize when you are anxious or stressed and learn ways to manage it.Exercise is a great option. SEEK MEDICAL CARE IF:  You have pain that is not relieved with rest or medicine.  You have pain that does not improve in 1 week.  You have new symptoms.  You are generally not feeling well. SEEK  IMMEDIATE MEDICAL CARE IF:   You have pain that radiates from your back into your legs.  You develop new bowel or bladder control problems.  You have unusual weakness or numbness in your arms or legs.  You develop nausea or vomiting.  You develop abdominal pain.  You feel faint. Document Released: 10/10/2005 Document Revised: 04/10/2012 Document Reviewed: 02/11/2014 [facility] Patient Information 2015 [place], [place]. This information is not intended to replace advice given to you by your health care provider. Make sure you  discuss any questions you have with your health care provider.  Muscle Strain A muscle strain is an injury that occurs when a muscle is stretched beyond its normal length. Usually a small number of muscle fibers are torn when this happens. Muscle strain is rated in degrees. First-degree strains have the least amount of muscle fiber tearing and pain. Second-degree and third-degree strains have increasingly more tearing and pain.  Usually, recovery from muscle strain takes 1-2 weeks. Complete healing takes 5-6 weeks.  CAUSES  Muscle strain happens when a sudden, violent force placed on a muscle stretches it too far. This may occur with lifting, sports, or a fall.  RISK FACTORS Muscle strain is especially common in athletes.  SIGNS AND SYMPTOMS At the site of the muscle strain, there may be:  Pain.  Bruising.  Swelling.  Difficulty using the muscle due to pain or lack of normal function. DIAGNOSIS  Your health care provider will perform a physical exam and ask about your medical history. TREATMENT  Often, the best treatment for a muscle strain is resting, icing, and applying cold compresses to the injured area.  HOME CARE INSTRUCTIONS   Use the PRICE method of treatment to promote muscle healing during the first 2-3 days after your injury. The PRICE method involves:  Protecting the muscle from being injured again.  Restricting your activity and resting the injured body part.  Icing your injury. To do this, put ice in a plastic bag. Place a towel between your skin and the bag. Then, apply the ice and leave it on from 15-20 minutes each hour. After the third day, switch to moist heat packs.  Apply compression to the injured area with a splint or elastic bandage. Be careful not to wrap it too tightly. This may interfere with blood circulation or increase swelling.  Elevate the injured body part above the level of your heart as often as you can.  Only take over-the-counter or  prescription medicines for pain, discomfort, or fever as directed by your health care provider.  Warming up prior to exercise helps to prevent future muscle strains. SEEK MEDICAL CARE IF:   You have increasing pain or swelling in the injured area.  You have numbness, tingling, or a significant loss of strength in the injured area. MAKE SURE YOU:   Understand these instructions.  Will watch your condition.  Will get help right away if you are not doing well or get worse. Document Released: 10/10/2005 Document Revised: 07/31/2013 Document Reviewed: 05/09/2013 [facility] Patient Information 2015 [place], [place]. This information is not intended to replace advice given to you by your health care provider. Make sure you discuss any questions you have with your health care provider.  Mid-Back Strain with Rehab  A strain is an injury in which a tendon or muscle is torn. The muscles and tendons of the mid-back are vulnerable to strains. However, these muscles and tendons are very strong and require a great force to be injured. The  muscles of the mid-back are responsible for stabilizing the spinal column, as well as spinal twisting (rotation). Strains are classified into three categories. Grade 1 strains cause pain, but the tendon is not lengthened. Grade 2 strains include a lengthened ligament, due to the ligament being stretched or partially ruptured. With grade 2 strains there is still function, although the function may be decreased. Grade 3 strains involve a complete tear of the tendon or muscle, and function is usually impaired. SYMPTOMS   Pain in the middle of the back.  Pain that may affect only one side, and is worse with movement.  Muscle spasms, and often swelling in the back.  Loss of strength of the back muscles.  Crackling sound (crepitation) when the muscles are touched. CAUSES  Mid-back strains occur when a force is placed on the muscles or tendons that is greater than they can  handle. Common causes of injury include:  Ongoing overuse of the muscle-tendon units in the middle back, usually from incorrect body posture.  A single violent injury or force applied to the back. RISK INCREASES WITH:  Sports that involve twisting forces on the spine or a lot of bending at the waist (football, rugby, weightlifting, bowling, golf, tennis, speed skating, racquetball, swimming, running, gymnastics, diving).  Poor strength and flexibility.  Failure to warm up properly before activity.  Family history of low back pain or disk disorders.  Previous back injury or surgery (especially fusion). PREVENTION  Learn and use proper sports technique.  Warm up and stretch properly before activity.  Allow for adequate recovery between workouts.  Maintain physical fitness:  Strength, flexibility, and endurance.  Cardiovascular fitness. PROGNOSIS  If treated properly, mid-back strains usually heal within 6 weeks. RELATED COMPLICATIONS   Frequently recurring symptoms, resulting in a chronic problem. Properly treating the problem the first time decreases frequency of recurrence.  Chronic inflammation, scarring, and partial muscle-tendon tear.  Delayed healing or resolution of symptoms, especially if activity is resumed too soon.  Prolonged disability. TREATMENT Treatment first involves the use of ice and medicine, to reduce pain and inflammation. As the pain begins to subside, you may begin strengthening and stretching exercises to improve body posture and sport technique. These exercises may be performed at home or with a therapist. Severe injuries may require referral to a therapist for further evaluation and treatment, such as ultrasound. Corticosteroid injections may be given to help reduce inflammation. Biofeedback (watching monitors of your body processes) and psychotherapy may also be prescribed. Prolonged bed rest is felt to do more harm than good. Massage may help break  the muscle spasms. Sometimes, an injection of cortisone, with or without local anesthetics, may be given to help relieve the pain and spasms. MEDICATION   If pain medicine is needed, nonsteroidal anti-inflammatory medicines (aspirin and ibuprofen), or other minor pain relievers (acetaminophen), are often advised.  Do not take pain medicine for 7 days before surgery.  Prescription pain relievers may be given, if your caregiver thinks they are needed. Use only as directed and only as much as you need.  Ointments applied to the skin may be helpful.  Corticosteroid injections may be given by your caregiver. These injections should be reserved for the most serious cases, because they may only be given a certain number of times. HEAT AND COLD:   Cold treatment (icing) should be applied for 10 to 15 minutes every 2 to 3 hours for inflammation and pain, and immediately after activity that aggravates your symptoms. Use ice  packs or an ice massage.  Heat treatment may be used before performing stretching and strengthening activities prescribed by your caregiver, physical therapist, or athletic trainer. Use a heat pack or a warm water soak. SEEK IMMEDIATE MEDICAL CARE IF:  Symptoms get worse or do not improve in 2 to 4 weeks, despite treatment.  You develop numbness, weakness, or loss of bowel or bladder function.  New, unexplained symptoms develop. (Drugs used in treatment may produce side effects.) EXERCISES RANGE OF MOTION (ROM) AND STRETCHING EXERCISES - Mid-Back Strain These exercises may help you when beginning to rehabilitate your injury. In order to successfully resolve your symptoms, you must improve your posture. These exercises are designed to help reduce the forward-head and rounded-shoulder posture which contributes to this condition. Your symptoms may resolve with or without further involvement from your physician, physical therapist or athletic trainer. While completing these exercises,  remember:   Restoring tissue flexibility helps normal motion to return to the joints. This allows healthier, less painful movement and activity.  An effective stretch should be held for at least 30 seconds.  A stretch should never be painful. You should only feel a gentle lengthening or release in the stretched tissue. STRETCH - Axial Extension  Stand or sit on a firm surface. Assume a good posture: chest up, shoulders drawn back, stomach muscles slightly tense, knees unlocked (if standing) and feet hip width apart.  Slowly retract your chin, so your head slides back and your chin slightly lowers. Continue to look straight ahead.  You should feel a gentle stretch in the back of your head. Be certain not to feel an aggressive stretch since this can cause headaches later.  Hold for __________ seconds. Repeat __________ times. Complete this exercise __________ times per day. RANGE OF MOTION- Upper Thoracic Extension  Sit on a firm chair with a high back. Assume a good posture: chest up, shoulders drawn back, abdominal muscles slightly tense, and feet hip width apart. Place a small pillow or folded towel in the curve of your lower back, if you are having difficulty maintaining good posture.  Gently brace your neck with your hands, allowing your arms to rest on your chest.  Continue to support your neck and slowly extend your back over the chair. You will feel a stretch across your upper back.  Hold __________ seconds. Slowly return to the starting position. Repeat __________ times. Complete this exercise __________ times per day. RANGE OF MOTION- Mid-Thoracic Extension  Roll a towel so that it is about 4 inches in diameter.  Position the towel lengthwise. Lay on the towel so that your spine, but not your shoulder blades, are supported.  You should feel your mid-back arching toward the floor. To increase the stretch, extend your arms away from your body.  Hold for __________  seconds. Repeat exercise __________ times, __________ times per day. STRENGTHENING EXERCISES - Mid-Back Strain These exercises may help you when beginning to rehabilitate your injury. They may resolve your symptoms with or without further involvement from your physician, physical therapist or athletic trainer. While completing these exercises, remember:   Muscles can gain both the endurance and the strength needed for everyday activities through controlled exercises.  Complete these exercises as instructed by your physician, physical therapist or athletic trainer. Increase the resistance and repetitions only as guided by your caregiver.  You may experience muscle soreness or fatigue, but the pain or discomfort you are trying to eliminate should never worsen during these exercises. If this pain  does worsen, stop and make certain you are following the directions exactly. If the pain is still present after adjustments, discontinue the exercise until you can discuss the trouble with your caregiver. STRENGTHENING - Quadruped, Opposite UE/LE Lift  Assume a hands and knees position on a firm surface. Keep your hands under your shoulders and your knees under your hips. You may place padding under your knees for comfort.  Find your neutral spine and gently tense your abdominal muscles so that you can maintain this position. Your shoulders and hips should form a rectangle that is parallel with the floor and is not twisted.  Keeping your trunk steady, lift your right hand no higher than your shoulder and then your left leg no higher than your hip. Make sure you are not holding your breath. Hold this position __________ seconds.  Continuing to keep your abdominal muscles tense and your back steady, slowly return to your starting position. Repeat with the opposite arm and leg. Repeat __________ times. Complete this exercise __________ times per day.  STRENGTH - Shoulder Extensors  Secure a rubber exercise  band or tubing to a fixed object (table, pole) so that it is at the height of your shoulders when you are either standing, or sitting on a firm armless chair.  With a thumbs-up grip, grasp an end of the band in each hand. Straighten your elbows and lift your hands straight in front of you at shoulder height. Step back away from the secured end of band, until it becomes tense.  Squeezing your shoulder blades together, pull your hands down to the sides of your thighs. Do not allow your hands to go behind you.  Hold for __________ seconds. Slowly ease the tension on the band, as you reverse the directions and return to the starting position. Repeat __________ times. Complete this exercise __________ times per day.  STRENGTH - Horizontal Abductors Choose one of the two positions to complete this exercise. Prone: lying on stomach:  Lie on your stomach on a firm surface so that your right / left arm overhangs the edge. Rest your forehead on your opposite forearm. With your palm facing the floor and your elbow straight, hold a __________ weight in your hand.  Squeeze your right / left shoulder blade to your mid-back spine and then slowly raise your arm to the height of the bed.  Hold for __________ seconds. Slowly reverse the directions and return to the starting position, controlling the weight as you lower your arm. Repeat __________ times. Complete this exercise __________ times per day. Standing:   Secure a rubber exercise band or tubing, so that it is at the height of your shoulders when you are either standing, or sitting on a firm armless chair.  Grasp an end of the band in each hand and have your palms face each other. Straighten your elbows and lift your hands straight in front of you at shoulder height. Step back away from the secured end of band, until it becomes tense.  Squeeze your shoulder blades together. Keeping your elbows locked and your hands at shoulder height, spread your arms  apart, forming a "T" shape with your body. Hold __________ seconds. Slowly ease the tension on the band, as you reverse the directions and return to the starting position. Repeat __________ times. Complete this exercise __________ times per day. STRENGTH - Scapular Retractors and External Rotators, Rowing  Secure a rubber exercise band or tubing, so that it is at the height of your  shoulders when you are either standing, or sitting on a firm armless chair.  With a palm-down grip, grasp an end of the band in each hand. Straighten your elbows and lift your hands straight in front of you at shoulder height. Step back away from the secured end of band, until it becomes tense.  Step 1: Squeeze your shoulder blades together. Bending your elbows, draw your hands to your chest as if you are rowing a boat. At the end of this motion, your hands and elbow should be at shoulder height and your elbows should be out to your sides.  Step 2: Rotate your shoulder to raise your hands above your head. Your forearms should be vertical and your upper arms should be horizontal.  Hold for __________ seconds. Slowly ease the tension on the band, as you reverse the directions and return to the starting position. Repeat __________ times. Complete this exercise __________ times per day.  POSTURE AND BODY MECHANICS CONSIDERATIONS - Mid-Back Strain Keeping correct posture when sitting, standing or completing your activities will reduce the stress put on different body tissues, allowing injured tissues a chance to heal and limiting painful experiences. The following are general guidelines for improved posture. Your physician or physical therapist will provide you with any instructions specific to your needs. While reading these guidelines, remember:  The exercises prescribed by your provider will help you have the flexibility and strength to maintain correct postures.  The correct posture provides the best environment for your  joints to work. All of your joints have less wear and tear when properly supported by a spine with good posture. This means you will experience a healthier, less painful body.  Correct posture must be practiced with all of your activities, especially prolonged sitting and standing. Correct posture is as important when doing repetitive low-stress activities (typing) as it is when doing a single heavy-load activity (lifting). PROPER SITTING POSTURE In order to minimize stress and discomfort on your spine, you must sit with correct posture. Sitting with good posture should be effortless for a healthy body. Returning to good posture is a gradual process. Many people can work toward this most comfortably by using various supports until they have the flexibility and strength to maintain this posture on their own. When sitting with proper posture, your ears will fall over your shoulders and your shoulders will fall over your hips. You should use the back of the chair to support your upper back. Your lower back will be in a neutral position, just slightly arched. You may place a small pillow or folded towel at the base of your low back for  support.  When working at a desk, create an environment that supports good, upright posture. Without extra support, muscles fatigue and lead to excessive strain on joints and other tissues. Keep these recommendations in mind: CHAIR:  A chair should be able to slide under your desk when your back makes contact with the back of the chair. This allows you to work closely.  The chair's height should allow your eyes to be level with the upper part of your monitor and your hands to be slightly lower than your elbows. BODY POSITION  Your feet should make contact with the floor. If this is not possible, use a foot rest.  Keep your ears over your shoulders. This will reduce stress on your neck and lower back. INCORRECT SITTING POSTURES If you are feeling tired and unable to  assume a healthy sitting posture, do  not slouch or slump. This puts excessive strain on your back tissues, causing more damage and pain. Healthier options include:  Using more support, like a lumbar pillow.  Switching tasks to something that requires you to be upright or walking.  Talking a brief walk.  Lying down to rest in a neutral-spine position. CORRECT STANDING POSTURES Proper standing posture should be assumed with all daily activities, even if they only take a few moments, like when brushing your teeth. As in sitting, your ears should fall over your shoulders and your shoulders should fall over your hips. You should keep a slight tension in your abdominal muscles to brace your spine. Your tailbone should point down to the ground, not behind your body, resulting in an over-extended swayback posture.  INCORRECT STANDING POSTURES Common incorrect standing postures include a forward head, locked knees, and an excessive swayback. WALKING Walk with an upright posture. Your ears, shoulders and hips should all line-up. CORRECT LIFTING TECHNIQUES DO :   Assume a wide stance. This will provide you more stability and the opportunity to get as close as possible to the object which you are lifting.  Tense your abdominals to brace your spine. Bend at the knees and hips. Keeping your back locked in a neutral-spine position, lift using your leg muscles. Lift with your legs, keeping your back straight.  Test the weight of unknown objects before attempting to lift them.  Try to keep your elbows locked down at your sides in order get the best strength from your shoulders when carrying an object.  Always ask for help when lifting heavy or awkward objects. INCORRECT LIFTING TECHNIQUES DO NOT:   Lock your knees when lifting, even if it is a small object.  Bend and twist. Pivot at your feet or move your feet when needing to change directions.  Assume that you can safely pick up even a paperclip  without proper posture. Document Released: 10/10/2005 Document Revised: 02/24/2014 Document Reviewed: 01/22/2009 [facility] Patient Information 2015 [place], [place]. This information is not intended to replace advice given to you by your health care provider. Make sure you discuss any questions you have with your health care provider.
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
## 2015-12-01 ENCOUNTER — Emergency Department (HOSPITAL_BASED_OUTPATIENT_CLINIC_OR_DEPARTMENT_OTHER)
Admission: EM | Admit: 2015-12-01 | Discharge: 2015-12-01 | Disposition: A | Discharge status: Home / Self Care | Payer: Self-pay | Attending: Emergency Medicine | Admitting: Emergency Medicine

## 2015-12-01 ENCOUNTER — Encounter (HOSPITAL_BASED_OUTPATIENT_CLINIC_OR_DEPARTMENT_OTHER): Payer: Self-pay

## 2015-12-01 ENCOUNTER — Emergency Department (HOSPITAL_BASED_OUTPATIENT_CLINIC_OR_DEPARTMENT_OTHER): Payer: Self-pay

## 2015-12-01 DIAGNOSIS — Y9372 Activity, wrestling: Secondary | ICD-10-CM | POA: Insufficient documentation

## 2015-12-01 DIAGNOSIS — Z8659 Personal history of other mental and behavioral disorders: Secondary | ICD-10-CM | POA: Insufficient documentation

## 2015-12-01 DIAGNOSIS — J45909 Unspecified asthma, uncomplicated: Secondary | ICD-10-CM | POA: Insufficient documentation

## 2015-12-01 DIAGNOSIS — F17200 Nicotine dependence, unspecified, uncomplicated: Secondary | ICD-10-CM | POA: Insufficient documentation

## 2015-12-01 DIAGNOSIS — S20211A Contusion of right front wall of thorax, initial encounter: Secondary | ICD-10-CM | POA: Insufficient documentation

## 2015-12-01 DIAGNOSIS — Z79899 Other long term (current) drug therapy: Secondary | ICD-10-CM | POA: Insufficient documentation

## 2015-12-01 DIAGNOSIS — Y998 Other external cause status: Secondary | ICD-10-CM | POA: Insufficient documentation

## 2015-12-01 DIAGNOSIS — Y9289 Other specified places as the place of occurrence of the external cause: Secondary | ICD-10-CM | POA: Insufficient documentation

## 2015-12-01 DIAGNOSIS — W51XXXA Accidental striking against or bumped into by another person, initial encounter: Secondary | ICD-10-CM | POA: Insufficient documentation

## 2015-12-01 MED ORDER — IBUPROFEN 800 MG PO TABS: 800.0000 mg | ORAL_TABLET | Freq: Three times a day (TID) | ORAL | Status: DC | PRN

## 2015-12-01 NOTE — ED Provider Notes (Signed)
CSN: [id number]     Arrival date & time 12/01/15  1220 History   First MD Initiated Contact with Patient 12/01/15 1339     Chief Complaint  Patient presents with  . Rib Injury     (Consider location/radiation/quality/duration/timing/severity/associated sxs/prior Treatment) HPI Patient presents to the Emergency Department complaining of right-sided rib pain. The patient was wrestling with his friend two days ago, when his friend fell on top of him and he hit his right side on a table. The patient endorses having right sided pleuritic pain with deep breathing. He has trouble with dressing due to pain. He describes the pain as sharp and it increases with certain movements.  He has not taken anything for the pain, he has not found anything that relieves it.  Past Medical History  Diagnosis Date  . Asthma   . Mental disorder   . Depression    History reviewed. No pertinent past surgical history. Family History  Problem Relation Age of Onset  . Asthma Father   . Alcohol abuse Father   . Asthma Brother    Social History  Substance Use Topics  . Smoking status: Current Every Day Smoker -- 1.00 packs/day  . Smokeless tobacco: Never Used  . Alcohol Use: Yes     Comment: rare    Review of Systems All other systems negative except as documented in the HPI. All pertinent positives and negatives as reviewed in the HPI.    Allergies  Uncoded nonscreenable allergen  Home Medications   Prior to Admission medications   Medication Sig Start Date End Date Taking? Authorizing Provider  albuterol (PROVENTIL HFA;VENTOLIN HFA) 108 (90 BASE) MCG/ACT inhaler Inhale 2 puffs into the lungs every 6 (six) hours as needed. For shortness of breath or wheezing 04/04/13   Thermon Leyland, NP   BP 99/78 mmHg  Pulse 64  Temp(Src) 97.8 F (36.6 C) (Oral)  Resp 16  Ht  (1.803 m)  Wt 79.379 kg  BMI 24.42 kg/m2  SpO2 98% Physical Exam  Constitutional: He is oriented to person, place, and time. He  appears well-developed and well-nourished. No distress.  HENT:  Head: Normocephalic and atraumatic.  Eyes: Pupils are equal, round, and reactive to light.  Neck: Normal range of motion. Neck supple.  Cardiovascular: Normal rate and regular rhythm.  Exam reveals no gallop and no friction rub.   No murmur heard. Pulmonary/Chest: Effort normal and breath sounds normal. No respiratory distress. He has no wheezes. He has no rales. He exhibits tenderness and bony tenderness. He exhibits no laceration, no crepitus, no edema, no deformity, no swelling and no retraction.    Abdominal: Soft. Bowel sounds are normal. He exhibits no distension. There is no tenderness. There is no rebound and no guarding.  Musculoskeletal: Normal range of motion.  Neurological: He is alert and oriented to person, place, and time.  Skin: Skin is warm and dry.  Psychiatric: He has a normal mood and affect. His behavior is normal.  Nursing note and vitals reviewed.   ED Course  Procedures (including critical care time) Labs Review Labs Reviewed - No data to display  Imaging Review Dg Ribs Unilateral W/chest Right  12/01/2015  CLINICAL DATA:  Right mid posterior rib pain. Symptoms began after wrestling 2 days ago. EXAM: RIGHT RIBS AND CHEST - 3+ VIEW COMPARISON:  Two-view chest x-ray 10/05/2012 FINDINGS: The the heart size is normal. The lungs are clear. There is no pneumothorax. Dedicated images of the right ribs demonstrate  no acute or healing rib fractures. IMPRESSION: Negative one-view chest x-ray and right rib radiographs. Electronically Signed   By: Marin Roberts M.D.   On: 12/01/2015 13:18   I have personally reviewed and evaluated these images and lab results as part of my medical decision-making.   Patient is given the x-ray results.  Told to return here as needed.  Told to use ice and heat on his chest wall is advised  Charlestine Night, PA-C 12/01/15 1433  Tilden Fossa, MD 12/02/15 [phone]

## 2015-12-01 NOTE — Discharge Instructions (Signed)
Return here as needed.  Follow-up with primary care doctor.  Use ice and heat on your chest wall
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
## 2016-04-10 ENCOUNTER — Emergency Department (HOSPITAL_COMMUNITY)
Admission: EM | Admit: 2016-04-10 | Discharge: 2016-04-11 | Disposition: A | Discharge status: Home / Self Care | Payer: Self-pay | Attending: Emergency Medicine | Admitting: Emergency Medicine

## 2016-04-10 ENCOUNTER — Encounter (HOSPITAL_COMMUNITY): Payer: Self-pay | Admitting: Emergency Medicine

## 2016-04-10 ENCOUNTER — Emergency Department (HOSPITAL_COMMUNITY): Payer: Self-pay

## 2016-04-10 DIAGNOSIS — S42471A Displaced transcondylar fracture of right humerus, initial encounter for closed fracture: Secondary | ICD-10-CM

## 2016-04-10 DIAGNOSIS — F329 Major depressive disorder, single episode, unspecified: Secondary | ICD-10-CM | POA: Insufficient documentation

## 2016-04-10 DIAGNOSIS — S50811A Abrasion of right forearm, initial encounter: Secondary | ICD-10-CM | POA: Insufficient documentation

## 2016-04-10 DIAGNOSIS — S42301A Unspecified fracture of shaft of humerus, right arm, initial encounter for closed fracture: Secondary | ICD-10-CM

## 2016-04-10 DIAGNOSIS — Z79899 Other long term (current) drug therapy: Secondary | ICD-10-CM | POA: Insufficient documentation

## 2016-04-10 DIAGNOSIS — T07XXXA Unspecified multiple injuries, initial encounter: Secondary | ICD-10-CM

## 2016-04-10 DIAGNOSIS — S80811A Abrasion, right lower leg, initial encounter: Secondary | ICD-10-CM | POA: Insufficient documentation

## 2016-04-10 DIAGNOSIS — Z7982 Long term (current) use of aspirin: Secondary | ICD-10-CM | POA: Insufficient documentation

## 2016-04-10 DIAGNOSIS — Y92410 Unspecified street and highway as the place of occurrence of the external cause: Secondary | ICD-10-CM | POA: Insufficient documentation

## 2016-04-10 DIAGNOSIS — J45909 Unspecified asthma, uncomplicated: Secondary | ICD-10-CM | POA: Insufficient documentation

## 2016-04-10 DIAGNOSIS — S70211A Abrasion, right hip, initial encounter: Secondary | ICD-10-CM | POA: Insufficient documentation

## 2016-04-10 DIAGNOSIS — S70311A Abrasion, right thigh, initial encounter: Secondary | ICD-10-CM | POA: Insufficient documentation

## 2016-04-10 DIAGNOSIS — S42491A Other displaced fracture of lower end of right humerus, initial encounter for closed fracture: Secondary | ICD-10-CM | POA: Insufficient documentation

## 2016-04-10 DIAGNOSIS — F17200 Nicotine dependence, unspecified, uncomplicated: Secondary | ICD-10-CM | POA: Insufficient documentation

## 2016-04-10 DIAGNOSIS — Y999 Unspecified external cause status: Secondary | ICD-10-CM | POA: Insufficient documentation

## 2016-04-10 DIAGNOSIS — Y939 Activity, unspecified: Secondary | ICD-10-CM | POA: Insufficient documentation

## 2016-04-10 DIAGNOSIS — S61411A Laceration without foreign body of right hand, initial encounter: Secondary | ICD-10-CM

## 2016-04-10 HISTORY — DX: Displaced transcondylar fracture of right humerus, initial encounter for closed fracture: S42.471A

## 2016-04-10 HISTORY — DX: Laceration without foreign body of right hand, initial encounter: S61.411A

## 2016-04-10 HISTORY — DX: Unspecified multiple injuries, initial encounter: T07.XXXA

## 2016-04-10 MED ORDER — HYDROMORPHONE HCL 1 MG/ML IJ SOLN
1.0000 mg | Freq: Once | INTRAMUSCULAR | Status: AC
  Administered 2016-04-10: 1 mg via INTRAVENOUS
  Filled 2016-04-10: qty 1

## 2016-04-10 MED ORDER — SODIUM CHLORIDE 0.9 % IV BOLUS (SEPSIS)
500.0000 mL | Freq: Once | INTRAVENOUS | Status: AC
  Administered 2016-04-10: 500 mL via INTRAVENOUS

## 2016-04-10 MED ORDER — TETANUS-DIPHTH-ACELL PERTUSSIS 5-2.5-18.5 LF-MCG/0.5 IM SUSP
0.5000 mL | Freq: Once | INTRAMUSCULAR | Status: AC
  Administered 2016-04-10: 0.5 mL via INTRAMUSCULAR
  Filled 2016-04-10: qty 0.5

## 2016-04-10 MED ORDER — ONDANSETRON HCL 4 MG/2ML IJ SOLN
4.0000 mg | Freq: Once | INTRAMUSCULAR | Status: AC
  Administered 2016-04-10: 4 mg via INTRAVENOUS
  Filled 2016-04-10: qty 2
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
## 2016-04-11 MED ORDER — HYDROMORPHONE HCL 1 MG/ML IJ SOLN
INTRAMUSCULAR | Status: AC
  Filled 2016-04-11: qty 1

## 2016-04-11 MED ORDER — OXYCODONE-ACETAMINOPHEN 5-325 MG PO TABS: 2.0000 | ORAL_TABLET | ORAL | Status: DC | PRN

## 2016-04-11 MED ORDER — HYDROMORPHONE HCL 1 MG/ML IJ SOLN
1.0000 mg | Freq: Once | INTRAMUSCULAR | Status: AC
  Administered 2016-04-11: 1 mg via INTRAVENOUS
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
## 2016-04-11 NOTE — Discharge Instructions (Signed)
Cast or Splint Care Casts and splints support injured limbs and keep bones from moving while they heal.  HOME CARE  Keep the cast or splint uncovered during the drying period.  A plaster cast can take 24 to 48 hours to dry.  A fiberglass cast will dry in less than 1 hour.  Do not rest the cast on anything harder than a pillow for 24 hours.  Do not put weight on your injured limb. Do not put pressure on the cast. Wait for your doctor's approval.  Keep the cast or splint dry.  Cover the cast or splint with a plastic bag during baths or wet weather.  If you have a cast over your chest and belly (trunk), take sponge baths until the cast is taken off.  If your cast gets wet, dry it with a towel or blow dryer. Use the cool setting on the blow dryer.  Keep your cast or splint clean. Wash a dirty cast with a damp cloth.  Do not put any objects under your cast or splint.  Do not scratch the skin under the cast with an object. If itching is a problem, use a blow dryer on a cool setting over the itchy area.  Do not trim or cut your cast.  Do not take out the padding from inside your cast.  Exercise your joints near the cast as told by your doctor.  Raise (elevate) your injured limb on 1 or 2 pillows for the first 1 to 3 days. GET HELP IF:  Your cast or splint cracks.  Your cast or splint is too tight or too loose.  You itch badly under the cast.  Your cast gets wet or has a soft spot.  You have a bad smell coming from the cast.  You get an object stuck under the cast.  Your skin around the cast becomes red or sore.  You have new or more pain after the cast is put on. GET HELP RIGHT AWAY IF:  You have fluid leaking through the cast.  You cannot move your fingers or toes.  Your fingers or toes turn blue or white or are cool, painful, or puffy (swollen).  You have tingling or lose feeling (numbness) around the injured area.  You have bad pain or pressure under the  cast.  You have trouble breathing or have shortness of breath.  You have chest pain.   This information is not intended to replace advice given to you by your health care provider. Make sure you discuss any questions you have with your health care provider.   Document Released: 02/09/2011 Document Revised: 06/12/2013 Document Reviewed: 04/18/2013 Elsevier Interactive Patient Education 2016 [employer]  Humerus Fracture Treated With Immobilization The humerus is the large bone in your upper arm. You have a broken (fractured) humerus. These fractures are easily diagnosed with X-rays. TREATMENT  Simple fractures which will heal without disability are treated with simple immobilization. Immobilization means you will wear a cast, splint, or sling. You have a fracture which will do well with immobilization. The fracture will heal well simply by being held in a good position until it is stable enough to begin range of motion exercises. Do not take part in activities which would further injure your arm.  HOME CARE INSTRUCTIONS   Put ice on the injured area.  Put ice in a plastic bag.  Place a towel between your skin and the bag.  Leave the ice on for 15-20 minutes, 03-04  times a day.  If you have a cast:  Do not scratch the skin under the cast using sharp or pointed objects.  Check the skin around the cast every day. You may put lotion on any red or sore areas.  Keep your cast dry and clean.  If you have a splint:  Wear the splint as directed.  Keep your splint dry and clean.  You may loosen the elastic around the splint if your fingers become numb, tingle, or turn cold or blue.  If you have a sling:  Wear the sling as directed.  Do not put pressure on any part of your cast or splint until it is fully hardened.  Your cast or splint can be protected during bathing with a plastic bag. Do not lower the cast or splint into water.  Only take over-the-counter or prescription  medicines for pain, discomfort, or fever as directed by your caregiver.  Do range of motion exercises as instructed by your caregiver.  Follow up as directed by your caregiver. This is very important in order to avoid permanent injury or disability and chronic pain. SEEK IMMEDIATE MEDICAL CARE IF:   Your skin or nails in the injured arm turn blue or gray.  Your arm feels cold or numb.  You develop severe pain in the injured arm.  You are having problems with the medicines you were given. MAKE SURE YOU:   Understand these instructions.  Will watch your condition.  Will get help right away if you are not doing well or get worse.   This information is not intended to replace advice given to you by your health care provider. Make sure you discuss any questions you have with your health care provider.   Document Released: 01/16/2001 Document Revised: 10/31/2014 Document Reviewed: 03/04/2015 Elsevier Interactive Patient Education [employer]

## 2016-04-12 ENCOUNTER — Encounter (HOSPITAL_BASED_OUTPATIENT_CLINIC_OR_DEPARTMENT_OTHER): Payer: Self-pay | Admitting: *Deleted

## 2016-04-13 DIAGNOSIS — S42411A Displaced simple supracondylar fracture without intercondylar fracture of right humerus, initial encounter for closed fracture: Secondary | ICD-10-CM
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
## 2016-04-14 ENCOUNTER — Encounter (HOSPITAL_BASED_OUTPATIENT_CLINIC_OR_DEPARTMENT_OTHER): Payer: Self-pay | Admitting: *Deleted

## 2016-04-14 ENCOUNTER — Ambulatory Visit (HOSPITAL_BASED_OUTPATIENT_CLINIC_OR_DEPARTMENT_OTHER)
Admission: RE | Admit: 2016-04-14 | Discharge: 2016-04-14 | Disposition: A | Discharge status: Home / Self Care | Payer: Self-pay | Source: Ambulatory Visit | Attending: Orthopedic Surgery | Admitting: Orthopedic Surgery

## 2016-04-14 ENCOUNTER — Ambulatory Visit (HOSPITAL_BASED_OUTPATIENT_CLINIC_OR_DEPARTMENT_OTHER): Payer: No Typology Code available for payment source | Admitting: Anesthesiology

## 2016-04-14 ENCOUNTER — Encounter (HOSPITAL_BASED_OUTPATIENT_CLINIC_OR_DEPARTMENT_OTHER)
Admission: RE | Disposition: A | Discharge status: Home / Self Care | Payer: Self-pay | Source: Ambulatory Visit | Attending: Orthopedic Surgery

## 2016-04-14 DIAGNOSIS — S42471A Displaced transcondylar fracture of right humerus, initial encounter for closed fracture: Secondary | ICD-10-CM | POA: Insufficient documentation

## 2016-04-14 DIAGNOSIS — F17200 Nicotine dependence, unspecified, uncomplicated: Secondary | ICD-10-CM | POA: Insufficient documentation

## 2016-04-14 DIAGNOSIS — J45909 Unspecified asthma, uncomplicated: Secondary | ICD-10-CM | POA: Insufficient documentation

## 2016-04-14 DIAGNOSIS — S42411A Displaced simple supracondylar fracture without intercondylar fracture of right humerus, initial encounter for closed fracture: Secondary | ICD-10-CM

## 2016-04-14 DIAGNOSIS — Z79899 Other long term (current) drug therapy: Secondary | ICD-10-CM | POA: Insufficient documentation

## 2016-04-14 HISTORY — DX: Family history of other specified conditions: Z84.89

## 2016-04-14 HISTORY — DX: Laceration without foreign body of right hand, initial encounter: S61.411A

## 2016-04-14 HISTORY — DX: Displaced transcondylar fracture of right humerus, initial encounter for closed fracture: S42.471A

## 2016-04-14 HISTORY — DX: Unspecified multiple injuries, initial encounter: T07.XXXA

## 2016-04-14 HISTORY — PX: ORIF HUMERUS FRACTURE: SHX2126

## 2016-04-14 SURGERY — OPEN REDUCTION INTERNAL FIXATION (ORIF) DISTAL HUMERUS FRACTURE
Anesthesia: General | Site: Elbow | Laterality: Right

## 2016-04-14 MED ORDER — ONDANSETRON HCL 4 MG PO TABS: 4.0000 mg | ORAL_TABLET | Freq: Three times a day (TID) | ORAL | Status: DC | PRN

## 2016-04-14 MED ORDER — FENTANYL CITRATE (PF) 100 MCG/2ML IJ SOLN
50.0000 ug | INTRAMUSCULAR | Status: DC | PRN
  Administered 2016-04-14: 100 ug via INTRAVENOUS

## 2016-04-14 MED ORDER — SUCCINYLCHOLINE CHLORIDE 20 MG/ML IJ SOLN
INTRAMUSCULAR | Status: DC | PRN
  Administered 2016-04-14: 120 mg via INTRAVENOUS

## 2016-04-14 MED ORDER — LIDOCAINE 2% (20 MG/ML) 5 ML SYRINGE
INTRAMUSCULAR | Status: AC
  Filled 2016-04-14: qty 5

## 2016-04-14 MED ORDER — ALBUTEROL SULFATE HFA 108 (90 BASE) MCG/ACT IN AERS
INHALATION_SPRAY | RESPIRATORY_TRACT | Status: DC | PRN
  Administered 2016-04-14: 2 via RESPIRATORY_TRACT

## 2016-04-14 MED ORDER — METHOCARBAMOL 500 MG PO TABS: 500.0000 mg | ORAL_TABLET | Freq: Four times a day (QID) | ORAL | Status: DC | PRN

## 2016-04-14 MED ORDER — MEPERIDINE HCL 25 MG/ML IJ SOLN: 6.2500 mg | INTRAMUSCULAR | Status: DC | PRN

## 2016-04-14 MED ORDER — GLYCOPYRROLATE 0.2 MG/ML IJ SOLN: 0.2000 mg | Freq: Once | INTRAMUSCULAR | Status: DC | PRN

## 2016-04-14 MED ORDER — DEXTROSE-NACL 5-0.45 % IV SOLN: 100.0000 mL/h | INTRAVENOUS | Status: DC

## 2016-04-14 MED ORDER — OXYCODONE HCL 5 MG PO TABS
ORAL_TABLET | ORAL | Status: AC
  Filled 2016-04-14: qty 1

## 2016-04-14 MED ORDER — OXYCODONE HCL 5 MG/5ML PO SOLN: 5.0000 mg | Freq: Once | ORAL | Status: AC | PRN

## 2016-04-14 MED ORDER — SCOPOLAMINE 1 MG/3DAYS TD PT72: 1.0000 | MEDICATED_PATCH | Freq: Once | TRANSDERMAL | Status: DC | PRN

## 2016-04-14 MED ORDER — OXYCODONE-ACETAMINOPHEN 5-325 MG PO TABS: 1.0000 | ORAL_TABLET | ORAL | Status: DC | PRN

## 2016-04-14 MED ORDER — HYDROMORPHONE HCL 1 MG/ML IJ SOLN
INTRAMUSCULAR | Status: AC
  Filled 2016-04-14: qty 1

## 2016-04-14 MED ORDER — ONDANSETRON HCL 4 MG/2ML IJ SOLN
INTRAMUSCULAR | Status: AC
  Filled 2016-04-14: qty 2

## 2016-04-14 MED ORDER — PROPOFOL 10 MG/ML IV BOLUS
INTRAVENOUS | Status: DC | PRN
Start: 2016-04-14 — End: 2016-04-14
  Administered 2016-04-14: 200 mg via INTRAVENOUS

## 2016-04-14 MED ORDER — LACTATED RINGERS IV SOLN
INTRAVENOUS | Status: DC
  Administered 2016-04-14 (×2): via INTRAVENOUS
  Administered 2016-04-14: 10 mL/h via INTRAVENOUS

## 2016-04-14 MED ORDER — CEFAZOLIN SODIUM-DEXTROSE 2-4 GM/100ML-% IV SOLN
2.0000 g | INTRAVENOUS | Status: AC
  Administered 2016-04-14: 2 g via INTRAVENOUS

## 2016-04-14 MED ORDER — EPHEDRINE 5 MG/ML INJ
INTRAVENOUS | Status: AC
  Filled 2016-04-14: qty 10

## 2016-04-14 MED ORDER — CHLORHEXIDINE GLUCONATE 4 % EX LIQD: 60.0000 mL | Freq: Once | CUTANEOUS | Status: DC

## 2016-04-14 MED ORDER — POVIDONE-IODINE 10 % EX SWAB: 2.0000 "application " | Freq: Once | CUTANEOUS | Status: DC

## 2016-04-14 MED ORDER — ONDANSETRON HCL 4 MG/2ML IJ SOLN
4.0000 mg | Freq: Once | INTRAMUSCULAR | Status: AC
  Administered 2016-04-14: 4 mg via INTRAVENOUS

## 2016-04-14 MED ORDER — CEFAZOLIN SODIUM-DEXTROSE 2-4 GM/100ML-% IV SOLN
INTRAVENOUS | Status: AC
  Filled 2016-04-14: qty 100

## 2016-04-14 MED ORDER — PROMETHAZINE HCL 25 MG/ML IJ SOLN
INTRAMUSCULAR | Status: AC
  Filled 2016-04-14: qty 1

## 2016-04-14 MED ORDER — ACETAMINOPHEN 500 MG PO TABS
1000.0000 mg | ORAL_TABLET | Freq: Once | ORAL | Status: AC
  Administered 2016-04-14: 1000 mg via ORAL

## 2016-04-14 MED ORDER — DEXAMETHASONE SODIUM PHOSPHATE 4 MG/ML IJ SOLN
INTRAMUSCULAR | Status: DC | PRN
  Administered 2016-04-14: 10 mg via INTRAVENOUS

## 2016-04-14 MED ORDER — PROMETHAZINE HCL 25 MG/ML IJ SOLN
6.2500 mg | Freq: Once | INTRAMUSCULAR | Status: AC | PRN
  Administered 2016-04-14: 6.25 mg via INTRAVENOUS

## 2016-04-14 MED ORDER — MIDAZOLAM HCL 2 MG/2ML IJ SOLN
INTRAMUSCULAR | Status: AC
  Filled 2016-04-14: qty 2

## 2016-04-14 MED ORDER — SUFENTANIL CITRATE 50 MCG/ML IV SOLN
INTRAVENOUS | Status: AC
  Filled 2016-04-14: qty 1

## 2016-04-14 MED ORDER — SUCCINYLCHOLINE CHLORIDE 200 MG/10ML IV SOSY
PREFILLED_SYRINGE | INTRAVENOUS | Status: AC
  Filled 2016-04-14: qty 10

## 2016-04-14 MED ORDER — MIDAZOLAM HCL 2 MG/2ML IJ SOLN
1.0000 mg | INTRAMUSCULAR | Status: DC | PRN
  Administered 2016-04-14 (×2): 2 mg via INTRAVENOUS

## 2016-04-14 MED ORDER — OXYCODONE HCL 5 MG PO TABS
5.0000 mg | ORAL_TABLET | Freq: Once | ORAL | Status: AC | PRN
  Administered 2016-04-14: 5 mg via ORAL

## 2016-04-14 MED ORDER — ACETAMINOPHEN 500 MG PO TABS
ORAL_TABLET | ORAL | Status: AC
  Filled 2016-04-14: qty 2

## 2016-04-14 MED ORDER — SUFENTANIL CITRATE 50 MCG/ML IV SOLN
INTRAVENOUS | Status: DC | PRN
  Administered 2016-04-14 (×2): 5 ug via INTRAVENOUS
  Administered 2016-04-14: 10 ug via INTRAVENOUS

## 2016-04-14 MED ORDER — BUPIVACAINE-EPINEPHRINE (PF) 0.5% -1:200000 IJ SOLN
INTRAMUSCULAR | Status: DC | PRN
  Administered 2016-04-14: 25 mL via PERINEURAL

## 2016-04-14 MED ORDER — FENTANYL CITRATE (PF) 100 MCG/2ML IJ SOLN
INTRAMUSCULAR | Status: AC
  Filled 2016-04-14: qty 2

## 2016-04-14 MED ORDER — DEXAMETHASONE SODIUM PHOSPHATE 10 MG/ML IJ SOLN
INTRAMUSCULAR | Status: AC
  Filled 2016-04-14: qty 1

## 2016-04-14 MED ORDER — HYDROMORPHONE HCL 1 MG/ML IJ SOLN
0.2500 mg | INTRAMUSCULAR | Status: DC | PRN
  Administered 2016-04-14 (×2): 0.5 mg via INTRAVENOUS

## 2016-04-14 SURGICAL SUPPLY — 81 items
BANDAGE ACE 4X5 VEL STRL LF (GAUZE/BANDAGES/DRESSINGS) ×6 IMPLANT
BIT DRILL 2.6 (BIT) ×6 IMPLANT
BIT DRILL 3.5MM (BIT) ×1
BIT DRILL OVERDRILL 3.5X122 (BIT) ×2 IMPLANT
BLADE SURG 15 STRL LF DISP TIS (BLADE) ×1 IMPLANT
BLADE SURG 15 STRL SS (BLADE) ×2
BNDG COHESIVE 4X5 TAN STRL (GAUZE/BANDAGES/DRESSINGS) ×3 IMPLANT
BNDG ESMARK 4X9 LF (GAUZE/BANDAGES/DRESSINGS) ×3 IMPLANT
CANISTER SUCT 1200ML W/VALVE (MISCELLANEOUS) ×3 IMPLANT
CHLORAPREP W/TINT 26ML (MISCELLANEOUS) ×3 IMPLANT
CLOSURE STERI-STRIP 1/2X4 (GAUZE/BANDAGES/DRESSINGS) ×1
CLSR STERI-STRIP ANTIMIC 1/2X4 (GAUZE/BANDAGES/DRESSINGS) ×2 IMPLANT
COVER BACK TABLE 60X90IN (DRAPES) ×6 IMPLANT
CUFF TOURNIQUET SINGLE 18IN (TOURNIQUET CUFF) IMPLANT
DECANTER SPIKE VIAL GLASS SM (MISCELLANEOUS) IMPLANT
DRAPE EXTREMITY T 121X128X90 (DRAPE) ×3 IMPLANT
DRAPE IMP U-DRAPE 54X76 (DRAPES) ×3 IMPLANT
DRAPE INCISE IOBAN 66X45 STRL (DRAPES) IMPLANT
DRAPE U-SHAPE 47X51 STRL (DRAPES) ×3 IMPLANT
DRSG PAD ABDOMINAL 8X10 ST (GAUZE/BANDAGES/DRESSINGS) ×3 IMPLANT
ELECT REM PT RETURN 9FT ADLT (ELECTROSURGICAL) ×3
ELECTRODE REM PT RTRN 9FT ADLT (ELECTROSURGICAL) ×1 IMPLANT
GAUZE SPONGE 4X4 12PLY STRL (GAUZE/BANDAGES/DRESSINGS) ×3 IMPLANT
GAUZE XEROFORM 1X8 LF (GAUZE/BANDAGES/DRESSINGS) ×3 IMPLANT
GLOVE BIO SURGEON STRL SZ 6.5 (GLOVE) ×4 IMPLANT
GLOVE BIO SURGEON STRL SZ7.5 (GLOVE) ×9 IMPLANT
GLOVE BIO SURGEONS STRL SZ 6.5 (GLOVE) ×2
GLOVE BIOGEL PI IND STRL 7.0 (GLOVE) ×1 IMPLANT
GLOVE BIOGEL PI IND STRL 8 (GLOVE) ×3 IMPLANT
GLOVE BIOGEL PI INDICATOR 7.0 (GLOVE) ×2
GLOVE BIOGEL PI INDICATOR 8 (GLOVE) ×6
GLOVE ORTHO TXT STRL SZ7.5 (GLOVE) ×3 IMPLANT
GOWN STRL REUS W/ TWL LRG LVL3 (GOWN DISPOSABLE) ×2 IMPLANT
GOWN STRL REUS W/ TWL XL LVL3 (GOWN DISPOSABLE) ×2 IMPLANT
GOWN STRL REUS W/TWL LRG LVL3 (GOWN DISPOSABLE) ×4
GOWN STRL REUS W/TWL XL LVL3 (GOWN DISPOSABLE) ×13 IMPLANT
K-WIRE 1.6X150 (WIRE) ×2
K-WIRE FX150X1.6XKRSH (WIRE) ×1
KWIRE FX150X1.6XKRSH (WIRE) ×1 IMPLANT
NEEDLE 1/2 CIR CATGUT .05X1.09 (NEEDLE) IMPLANT
NEEDLE HYPO 25X1 1.5 SAFETY (NEEDLE) IMPLANT
NS IRRIG 1000ML POUR BTL (IV SOLUTION) ×3 IMPLANT
PACK BASIN DAY SURGERY FS (CUSTOM PROCEDURE TRAY) ×3 IMPLANT
PAD CAST 4YDX4 CTTN HI CHSV (CAST SUPPLIES) ×2 IMPLANT
PADDING CAST ABS 4INX4YD NS (CAST SUPPLIES)
PADDING CAST ABS COTTON 4X4 ST (CAST SUPPLIES) IMPLANT
PADDING CAST COTTON 4X4 STRL (CAST SUPPLIES) ×4
PENCIL BUTTON HOLSTER BLD 10FT (ELECTRODE) ×3 IMPLANT
PLATE POST MED 8H (Plate) ×3 IMPLANT
SCREW BONE 3.5X18MM (Screw) ×3 IMPLANT
SCREW BONE 3.5X20MM (Screw) ×3 IMPLANT
SCREW BONE 3.5X24MM (Screw) ×21 IMPLANT
SCREW NON LOCKING 22MM (Screw) ×6 IMPLANT
SHEET MEDIUM DRAPE 40X70 STRL (DRAPES) ×3 IMPLANT
SLEEVE SCD COMPRESS KNEE MED (MISCELLANEOUS) IMPLANT
SLING ARM FOAM STRAP LRG (SOFTGOODS) IMPLANT
SLING ARM FOAM STRAP XLG (SOFTGOODS) ×3 IMPLANT
SLING ARM MED ADULT FOAM STRAP (SOFTGOODS) IMPLANT
SPLINT FAST PLASTER 5X30 (CAST SUPPLIES)
SPLINT PLASTER CAST FAST 5X30 (CAST SUPPLIES) IMPLANT
SPONGE LAP 18X18 X RAY DECT (DISPOSABLE) ×3 IMPLANT
STOCKINETTE IMPERVIOUS LG (DRAPES) ×3 IMPLANT
SUCTION FRAZIER HANDLE 10FR (MISCELLANEOUS) ×2
SUCTION TUBE FRAZIER 10FR DISP (MISCELLANEOUS) ×1 IMPLANT
SUT ETHILON 3 0 PS 1 (SUTURE) ×3 IMPLANT
SUT FIBERWIRE #2 38 T-5 BLUE (SUTURE) ×3
SUT MNCRL AB 4-0 PS2 18 (SUTURE) ×3 IMPLANT
SUT MON AB 2-0 CT1 36 (SUTURE) ×6 IMPLANT
SUT VIC AB 0 CT1 27 (SUTURE) ×2
SUT VIC AB 0 CT1 27XBRD ANBCTR (SUTURE) ×1 IMPLANT
SUT VIC AB 2-0 SH 27 (SUTURE)
SUT VIC AB 2-0 SH 27XBRD (SUTURE) IMPLANT
SUT VICRYL 3-0 CR8 SH (SUTURE) ×3 IMPLANT
SUTURE FIBERWR #2 38 T-5 BLUE (SUTURE) ×1 IMPLANT
SYR BULB 3OZ (MISCELLANEOUS) ×3 IMPLANT
TOWEL OR 17X24 6PK STRL BLUE (TOWEL DISPOSABLE) ×6 IMPLANT
TOWEL OR NON WOVEN STRL DISP B (DISPOSABLE) ×6 IMPLANT
TUBE CONNECTING 20'X1/4 (TUBING) ×2
TUBE CONNECTING 20X1/4 (TUBING) ×4 IMPLANT
UNDERPAD 30X30 (UNDERPADS AND DIAPERS) ×3 IMPLANT
YANKAUER SUCT BULB TIP NO VENT (SUCTIONS) ×3 IMPLANT
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
## 2016-04-14 NOTE — Op Note (Signed)
04/14/2016  12:01 PM  PATIENT:  [name] Hall    PRE-OPERATIVE DIAGNOSIS:  DISPLACED TRANSCONDYLAR FRACTURE OF UNSPECIFIED HUMERUS INITIAL ENCOUNTER FOR CLOSED FRACTURE  POST-OPERATIVE DIAGNOSIS:  Same  PROCEDURE:  OPEN REDUCTION INTERNAL FIXATION (ORIF) HUMERAL SUPRACONDYL RIGHT ELBOW  SURGEON:  MURPHY, Jewel BaizeIMOTHY D, MD  ASSISTANT: Aquilla HackerHenry Martensen, PA-C, She was present and scrubbed throughout the case, critical for completion in a timely fashion, and for retraction, instrumentation, and closure.   ANESTHESIA:   gen  PREOPERATIVE INDICATIONS:  [name] Hall is a  30 y.o. male with a diagnosis of DISPLACED TRANSCONDYLAR FRACTURE OF UNSPECIFIED HUMERUS INITIAL ENCOUNTER FOR CLOSED FRACTURE who failed conservative measures and elected for surgical management.    The risks benefits and alternatives were discussed with the patient preoperatively including but not limited to the risks of infection, bleeding, nerve injury, cardiopulmonary complications, the need for revision surgery, among others, and the patient was willing to proceed.  OPERATIVE IMPLANTS: stryker plate  OPERATIVE FINDINGS: stable reduction  BLOOD LOSS: min  COMPLICATIONS: none  TOURNIQUET TIME: 90min  OPERATIVE PROCEDURE:  Patient was identified in the preoperative holding area and site was marked by me He was transported to the operating theater and placed on the table in supine position taking care to pad all bony prominences. After a preincinduction time out anesthesia was induced. The right upper extremity was prepped and draped in normal sterile fashion and a pre-incision timeout was performed. He received ancef for preoperative antibiotics.   He is placed in lateral position with a ax roll and the beanbag. Again all bony prominences were padded. I then prepped and draped the right upper extremity sterile tourniquet was placed and this was inflated to 250 mmHg. I made a posterior incision and dissected down to the  triceps fascia which I incised in line with the fibers I split this distally with blunt dissection. Proximally I would not performed a careful dissection of the triceps get down to periosteum identified and elevated the radial nerve to protect it from being trapped under the plate or stretched. After performing a neural lysis here I then turned my attention to the fracture sites there were 4 fracture fragments are placed lag screws through each of these fixing them on the place. I was happy with the fixation across these I then selected a lateral baseplate and pinned it into place. I took multiple x-rays and was happy with the alignment plate  I then placed 3 screws proximally with excellent purchase and follow this with 3 screws distally with a good purchase I placed multiple interfrag screws there was a distal split that translated her that traversed from proximal lateral to distal medial as I did place an additional screw distally to provide more fixation here. I placed another lag screw across the split as well.  I then thoroughly irrigated his incision I again visualize the top of the plate confirmed there was no nerve entrapment underneath it. I then closed his triceps fascia followed by the skin using a nylon stitch. Sterile dressings were applied and a long-arm splint I dressed up all of his multiple abrasions.  He was then awoken and taken the PACU in stable condition  POST OPERATIVE PLAN: Ambulate DVT prophylaxis    This note was generated using a template and dragon dictation system. In light of that, I have reviewed the note and all aspects of it are applicable to this case. Any dictation errors are due to the computerized dictation system.

## 2016-04-14 NOTE — Anesthesia Procedure Notes (Addendum)
Anesthesia Regional Block:  Interscalene brachial plexus block  Pre-Anesthetic Checklist: ,, timeout performed, Correct Patient, Correct Site, Correct Laterality, Correct Procedure, Correct Position, site marked, Risks and benefits discussed,  Surgical consent,  Pre-op evaluation,  At surgeon's request and post-op pain management  Laterality: Right and Upper  Prep: chloraprep       Needles:  Injection technique: Single-shot  Needle Type: Echogenic Needle     Needle Length: 5cm 5 cm Needle Gauge: 21 and 21 G    Additional Needles:  Procedures: ultrasound guided (picture in chart) Interscalene brachial plexus block Narrative:  Start time: 04/14/2016 8:09 AM End time: 04/14/2016 8:14 AM Injection made incrementally with aspirations every 5 mL.  Performed by: Personally  Anesthesiologist: CREWS, DAVID   Procedure Name: Intubation Date/Time: 04/14/2016 10:22 AM Performed by: Zenia ResidesPAYNE, Elvenia Godden D Pre-anesthesia Checklist: Patient identified, Emergency Drugs available, Suction available and Patient being monitored Patient Re-evaluated:Patient Re-evaluated prior to inductionOxygen Delivery Method: Circle system utilized Preoxygenation: Pre-oxygenation with 100% oxygen Intubation Type: IV induction Ventilation: Mask ventilation without difficulty Laryngoscope Size: Mac and 3 Grade View: Grade I Tube type: Oral Tube size: 7.0 mm Number of attempts: 1 Airway Equipment and Method: Stylet and Oral airway Placement Confirmation: ETT inserted through vocal cords under direct vision,  positive ETCO2 and breath sounds checked- equal and bilateral Secured at: 22 cm Tube secured with: Tape Dental Injury: Teeth and Oropharynx as per pre-operative assessment       Right ISB image

## 2016-04-14 NOTE — Discharge Instructions (Signed)
Arm sling full time.   No weight bearing right arm.  Diet: As you were doing prior to hospitalization   Shower:   If the bandage gets wet, change with a clean dry gauze.  You have a splint on, leave the splint in place.  Dressing:  You have a splint, then just leave the splint in place and we will change your bandages during your first follow-up appointment.     Activity:  Increase activity slowly as tolerated, but follow the weight bearing instructions below.  The rules on driving is that you can not be taking narcotics while you drive, and you must feel in control of the vehicle.    To prevent constipation: you may use a stool softener such as -  Colace (over the counter) 100 mg by mouth twice a day  Drink plenty of fluids (prune juice may be helpful) and high fiber foods Miralax (over the counter) for constipation as needed.    Itching:  If you experience itching with your medications, try taking only a single pain pill, or even half a pain pill at a time.  You may take up to 10 pain pills per day, and you can also use benadryl over the counter for itching or also to help with sleep.   Precautions:  If you experience chest pain or shortness of breath - call 911 immediately for transfer to the hospital emergency department!!  If you develop a fever greater that 101 F, purulent drainage from wound, increased redness or drainage from wound, or calf pain -- Call the office at [phone]                                                 Follow- Up Appointment:  Please call for an appointment to be seen in 2 [place] - [phone]   Regional Anesthesia Blocks  1. Numbness or the inability to move the "blocked" extremity may last from 3-48 hours after placement. The length of time depends on the medication injected and your individual response to the medication. If the numbness is not going away after 48 hours, call your surgeon.  2. The extremity that is blocked will need to be  protected until the numbness is gone and the  Strength has returned. Because you cannot feel it, you will need to take extra care to avoid injury. Because it may be weak, you may have difficulty moving it or using it. You may not know what position it is in without looking at it while the block is in effect.  3. For blocks in the legs and feet, returning to weight bearing and walking needs to be done carefully. You will need to wait until the numbness is entirely gone and the strength has returned. You should be able to move your leg and foot normally before you try and bear weight or walk. You will need someone to be with you when you first try to ensure you do not fall and possibly risk injury.  4. Bruising and tenderness at the needle site are common side effects and will resolve in a few days.  5. Persistent numbness or new problems with movement should be communicated to the surgeon or the [facility] [place] [phone])/ [facility] [place] [phone]).    Post Anesthesia Home Care Instructions  Activity: Get plenty of  rest for the remainder of the day. A responsible adult should stay with you for 24 hours following the procedure.  For the next 24 hours, DO NOT: -Drive a car -Advertising copywriterperate machinery -Drink alcoholic beverages -Take any medication unless instructed by your physician -Make any legal decisions or sign important papers.  Meals: Start with liquid foods such as gelatin or soup. Progress to regular foods as tolerated. Avoid greasy, spicy, heavy foods. If nausea and/or vomiting occur, drink only clear liquids until the nausea and/or vomiting subsides. Call your physician if vomiting continues.  Special Instructions/Symptoms: Your throat may feel dry or sore from the anesthesia or the breathing tube placed in your throat during surgery. If this causes discomfort, gargle with warm salt water. The discomfort should disappear within 24 hours.  If you had a scopolamine  patch placed behind your ear for the management of post- operative nausea and/or vomiting:  1. The medication in the patch is effective for 72 hours, after which it should be removed.  Wrap patch in a tissue and discard in the trash. Wash hands thoroughly with soap and water. 2. You may remove the patch earlier than 72 hours if you experience unpleasant side effects which may include dry mouth, dizziness or visual disturbances. 3. Avoid touching the patch. Wash your hands with soap and water after contact with the patch.

## 2016-04-14 NOTE — Progress Notes (Signed)
Assisted Dr. Crews with right, ultrasound guided, interscalene  block. Side rails up, monitors on throughout procedure. See vital signs in flow sheet. Tolerated Procedure well. 

## 2016-04-14 NOTE — Transfer of Care (Signed)
Immediate Anesthesia Transfer of Care Note  Patient: [name] Hall  Procedure(s) Performed: Procedure(s): OPEN REDUCTION INTERNAL FIXATION (ORIF) HUMERAL SUPRACONDYL RIGHT ELBOW (Right)  Patient Location: PACU  Anesthesia Type:GA combined with regional for post-op pain  Level of Consciousness: awake, alert  and oriented  Airway & Oxygen Therapy: Patient Spontanous Breathing and Patient connected to face mask oxygen  Post-op Assessment: Report given to RN and Post -op Vital signs reviewed and stable  Post vital signs: Reviewed and stable  Last Vitals:  Filed Vitals:   04/14/16 0805 04/14/16 0810  BP: 125/68   Pulse: 99 83  Temp:    Resp: 13 8    Last Pain:  Filed Vitals:   04/14/16 0817  PainSc: 8          Complications: No apparent anesthesia complications

## 2016-04-14 NOTE — Anesthesia Postprocedure Evaluation (Signed)
Anesthesia Post Note  Patient: [name] Hall AreaJoshua W Morella  Procedure(s) Performed: Procedure(s) (LRB): OPEN REDUCTION INTERNAL FIXATION (ORIF) HUMERAL SUPRACONDYL RIGHT ELBOW (Right)  Patient location during evaluation: PACU Anesthesia Type: General and Regional Level of consciousness: awake and alert and oriented Pain management: pain level controlled Vital Signs Assessment: post-procedure vital signs reviewed and stable Respiratory status: spontaneous breathing, nonlabored ventilation and respiratory function stable Cardiovascular status: blood pressure returned to baseline and stable Postop Assessment: no signs of nausea or vomiting Anesthetic complications: no    Last Vitals:  Filed Vitals:   04/14/16 1345 04/14/16 1400  BP: 115/67 128/89  Pulse: 83 93  Temp:    Resp: 15 14    Last Pain:  Filed Vitals:   04/14/16 1408  PainSc: 6                  Eugean Arnott A.

## 2016-04-14 NOTE — Anesthesia Preprocedure Evaluation (Signed)
Anesthesia Evaluation  Patient identified by MRN, date of birth, ID band Patient awake    Reviewed: Allergy & Precautions, NPO status , Patient's Chart, lab work & pertinent test results  Airway Mallampati: I  TM Distance: >3 FB Neck ROM: Full    Dental  (+) Teeth Intact, Dental Advisory Given   Pulmonary asthma , Current Smoker,    breath sounds clear to auscultation       Cardiovascular  Rhythm:Regular Rate:Normal     Neuro/Psych    GI/Hepatic   Endo/Other    Renal/GU      Musculoskeletal   Abdominal   Peds  Hematology   Anesthesia Other Findings   Reproductive/Obstetrics                             Anesthesia Physical Anesthesia Plan  ASA: II  Anesthesia Plan: General   Post-op Pain Management: GA combined w/ Regional for post-op pain   Induction: Intravenous  Airway Management Planned: Oral ETT  Additional Equipment:   Intra-op Plan:   Post-operative Plan: Extubation in OR  Informed Consent: I have reviewed the patients History and Physical, chart, labs and discussed the procedure including the risks, benefits and alternatives for the proposed anesthesia with the patient or authorized representative who has indicated his/her understanding and acceptance.   Dental advisory given  Plan Discussed with: CRNA, Anesthesiologist and Surgeon  Anesthesia Plan Comments:         Anesthesia Quick Evaluation

## 2016-04-15 ENCOUNTER — Encounter (HOSPITAL_BASED_OUTPATIENT_CLINIC_OR_DEPARTMENT_OTHER): Payer: Self-pay | Admitting: Orthopedic Surgery

## 2016-04-18 MED FILL — Oxycodone w/ Acetaminophen Tab 5-325 MG: ORAL | Qty: 6 | Status: AC

## 2016-05-10 ENCOUNTER — Encounter (HOSPITAL_BASED_OUTPATIENT_CLINIC_OR_DEPARTMENT_OTHER): Payer: No Typology Code available for payment source

## 2016-06-15 ENCOUNTER — Encounter (HOSPITAL_BASED_OUTPATIENT_CLINIC_OR_DEPARTMENT_OTHER): Payer: No Typology Code available for payment source

## 2017-04-18 ENCOUNTER — Encounter (HOSPITAL_COMMUNITY): Payer: Self-pay | Admitting: *Deleted

## 2017-04-18 ENCOUNTER — Emergency Department (HOSPITAL_COMMUNITY)
Admission: EM | Admit: 2017-04-18 | Discharge: 2017-04-18 | Disposition: A | Discharge status: Home / Self Care | Payer: Self-pay | Attending: Emergency Medicine | Admitting: Emergency Medicine

## 2017-04-18 ENCOUNTER — Emergency Department (HOSPITAL_COMMUNITY): Payer: Self-pay

## 2017-04-18 DIAGNOSIS — F17210 Nicotine dependence, cigarettes, uncomplicated: Secondary | ICD-10-CM | POA: Insufficient documentation

## 2017-04-18 DIAGNOSIS — J4551 Severe persistent asthma with (acute) exacerbation: Secondary | ICD-10-CM | POA: Insufficient documentation

## 2017-04-18 LAB — BASIC METABOLIC PANEL
ANION GAP: 9 (ref 5–15)
BUN: 11 mg/dL (ref 6–20)
CHLORIDE: 107 mmol/L (ref 101–111)
CO2: 21 mmol/L — AB (ref 22–32)
CREATININE: 0.79 mg/dL (ref 0.61–1.24)
Calcium: 8.7 mg/dL — ABNORMAL LOW (ref 8.9–10.3)
GFR calc non Af Amer: >60 mL/min (ref >60)
Glucose, Bld: 123 mg/dL — ABNORMAL HIGH (ref 65–99)
Potassium: 3.3 mmol/L — ABNORMAL LOW (ref 3.5–5.1)
SODIUM: 137 mmol/L (ref 135–145)

## 2017-04-18 LAB — CBC WITH DIFFERENTIAL/PLATELET
BASOS ABS: 0 10*3/uL (ref 0.0–0.1)
BASOS PCT: 1 %
EOS ABS: 0.7 10*3/uL (ref 0.0–0.7)
Eosinophils Relative: 10 %
HEMATOCRIT: 43.5 % (ref 39.0–52.0)
HEMOGLOBIN: 15.5 g/dL (ref 13.0–17.0)
Lymphocytes Relative: 12 %
Lymphs Abs: 0.9 10*3/uL (ref 0.7–4.0)
MCH: 30 pg (ref 26.0–34.0)
MCHC: 35.6 g/dL (ref 30.0–36.0)
MCV: 84.1 fL (ref 78.0–100.0)
MONOS PCT: 6 %
Monocytes Absolute: 0.4 10*3/uL (ref 0.1–1.0)
NEUTROS PCT: 71 %
Neutro Abs: 5.1 10*3/uL (ref 1.7–7.7)
Platelets: 222 10*3/uL (ref 150–400)
RBC: 5.17 MIL/uL (ref 4.22–5.81)
RDW: 13.4 % (ref 11.5–15.5)
WBC: 7.1 10*3/uL (ref 4.0–10.5)

## 2017-04-18 MED ORDER — POTASSIUM CHLORIDE CRYS ER 20 MEQ PO TBCR
40.0000 meq | EXTENDED_RELEASE_TABLET | Freq: Once | ORAL | Status: AC
  Administered 2017-04-18: 40 meq via ORAL
  Filled 2017-04-18: qty 2

## 2017-04-18 MED ORDER — ALL-IN-ONE NEBULIZER SYSTEM MISC: 1.0000 [IU] | 0 refills | Status: DC | PRN

## 2017-04-18 MED ORDER — ALBUTEROL SULFATE (2.5 MG/3ML) 0.083% IN NEBU
2.5000 mg | INHALATION_SOLUTION | Freq: Once | RESPIRATORY_TRACT | Status: AC
  Administered 2017-04-18: 2.5 mg via RESPIRATORY_TRACT
  Filled 2017-04-18: qty 3

## 2017-04-18 MED ORDER — ALBUTEROL SULFATE (2.5 MG/3ML) 0.083% IN NEBU: 2.5000 mg | INHALATION_SOLUTION | Freq: Four times a day (QID) | RESPIRATORY_TRACT | 12 refills | Status: DC | PRN

## 2017-04-18 MED ORDER — ALBUTEROL SULFATE HFA 108 (90 BASE) MCG/ACT IN AERS
2.0000 | INHALATION_SPRAY | Freq: Once | RESPIRATORY_TRACT | Status: AC
  Administered 2017-04-18: 2 via RESPIRATORY_TRACT
  Filled 2017-04-18: qty 6.7

## 2017-04-18 MED ORDER — MAGNESIUM SULFATE 2 GM/50ML IV SOLN
2.0000 g | Freq: Once | INTRAVENOUS | Status: AC
  Administered 2017-04-18: 2 g via INTRAVENOUS
  Filled 2017-04-18: qty 50

## 2017-04-18 MED ORDER — IPRATROPIUM-ALBUTEROL 0.5-2.5 (3) MG/3ML IN SOLN
3.0000 mL | Freq: Once | RESPIRATORY_TRACT | Status: AC
  Administered 2017-04-18: 3 mL via RESPIRATORY_TRACT
  Filled 2017-04-18: qty 3

## 2017-04-18 MED ORDER — PREDNISONE 20 MG PO TABS: ORAL_TABLET | ORAL | 0 refills | Status: DC

## 2017-04-18 NOTE — ED Triage Notes (Signed)
Pt brought in by [facility] for c/o sob; pt states he has been having sob x 5 days with worsening of sx today; pt administered breathing treatments at home with no relief; pt given solumedrol 125mg  and duoneb en route by ems.
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
## 2017-04-18 NOTE — ED Provider Notes (Signed)
4:38 PM Assumed care from Dr. Preston FleetingGlick, please see their note for full history, physical and decision making until this point. In brief this is a 31 y.o. year old male who presented to the ED tonight with Shortness of Breath     Asthma exacerbation, access muscle usage. Duoneb/steroids with EMS. Getting more albuterol here, reassess for disposition.    On multiple re-evaluations the patient appears well. Normal using accessory muscles to breathe. Oxygenation is normal. No tachypnea. Absolutely no wheezing on repeat lung exams. Patient is stable for discharge at this time on continued albuterol. Also needing steroids. Knows to return here for any worsening symptoms.  Discharge instructions, including strict return precautions for new or worsening symptoms, given. Patient and/or family verbalized understanding and agreement with the plan as described.   Labs, studies and imaging reviewed by myself and considered in medical decision making if ordered. Imaging interpreted by radiology.  Labs Reviewed  BASIC METABOLIC PANEL - Abnormal; Notable for the following:       Result Value   Potassium 3.3 (*)    CO2 21 (*)    Glucose, Bld 123 (*)    Calcium 8.7 (*)    All other components within normal limits  CBC WITH DIFFERENTIAL/PLATELET    DG Chest 2 View  Final Result      No Follow-up on file.    Marily MemosMesner, Eknoor Novack, MD 04/18/17 [phone]

## 2017-07-08 ENCOUNTER — Encounter (HOSPITAL_COMMUNITY): Payer: Self-pay | Admitting: Emergency Medicine

## 2017-07-08 ENCOUNTER — Emergency Department (HOSPITAL_COMMUNITY): Payer: Self-pay

## 2017-07-08 ENCOUNTER — Emergency Department (HOSPITAL_COMMUNITY)
Admission: EM | Admit: 2017-07-08 | Discharge: 2017-07-09 | Disposition: A | Discharge status: Home / Self Care | Payer: Self-pay | Attending: Emergency Medicine | Admitting: Emergency Medicine

## 2017-07-08 DIAGNOSIS — R509 Fever, unspecified: Secondary | ICD-10-CM | POA: Insufficient documentation

## 2017-07-08 DIAGNOSIS — Z79899 Other long term (current) drug therapy: Secondary | ICD-10-CM | POA: Insufficient documentation

## 2017-07-08 DIAGNOSIS — J4551 Severe persistent asthma with (acute) exacerbation: Secondary | ICD-10-CM | POA: Insufficient documentation

## 2017-07-08 DIAGNOSIS — F17210 Nicotine dependence, cigarettes, uncomplicated: Secondary | ICD-10-CM | POA: Insufficient documentation

## 2017-07-08 LAB — URINALYSIS, ROUTINE W REFLEX MICROSCOPIC
BILIRUBIN URINE: NEGATIVE
Bacteria, UA: NONE SEEN
Glucose, UA: NEGATIVE mg/dL
Ketones, ur: NEGATIVE mg/dL
LEUKOCYTES UA: NEGATIVE
NITRITE: NEGATIVE
PH: 5 (ref 5.0–8.0)
Protein, ur: NEGATIVE mg/dL
RBC / HPF: NONE SEEN RBC/hpf (ref 0–5)
Specific Gravity, Urine: 1.013 (ref 1.005–1.030)

## 2017-07-08 LAB — CBC WITH DIFFERENTIAL/PLATELET
BASOS ABS: 0 10*3/uL (ref 0.0–0.1)
Basophils Relative: 0 %
EOS PCT: 4 %
Eosinophils Absolute: 0.4 10*3/uL (ref 0.0–0.7)
HCT: 43.1 % (ref 39.0–52.0)
Hemoglobin: 14.9 g/dL (ref 13.0–17.0)
LYMPHS PCT: 9 %
Lymphs Abs: 0.8 10*3/uL (ref 0.7–4.0)
MCH: 30 pg (ref 26.0–34.0)
MCHC: 34.6 g/dL (ref 30.0–36.0)
MCV: 86.7 fL (ref 78.0–100.0)
MONO ABS: 1.1 10*3/uL — AB (ref 0.1–1.0)
Monocytes Relative: 12 %
Neutro Abs: 7 10*3/uL (ref 1.7–7.7)
Neutrophils Relative %: 75 %
PLATELETS: 231 10*3/uL (ref 150–400)
RBC: 4.97 MIL/uL (ref 4.22–5.81)
RDW: 12.7 % (ref 11.5–15.5)
WBC: 9.3 10*3/uL (ref 4.0–10.5)

## 2017-07-08 LAB — COMPREHENSIVE METABOLIC PANEL
ALK PHOS: 67 U/L (ref 38–126)
ALT: 31 U/L (ref 17–63)
AST: 30 U/L (ref 15–41)
Albumin: 4.3 g/dL (ref 3.5–5.0)
Anion gap: 10 (ref 5–15)
BILIRUBIN TOTAL: 0.5 mg/dL (ref 0.3–1.2)
BUN: 13 mg/dL (ref 6–20)
CO2: 23 mmol/L (ref 22–32)
Calcium: 9.3 mg/dL (ref 8.9–10.3)
Chloride: 104 mmol/L (ref 101–111)
Creatinine, Ser: 0.84 mg/dL (ref 0.61–1.24)
GFR calc Af Amer: >60 mL/min (ref >60)
GFR calc non Af Amer: >60 mL/min (ref >60)
GLUCOSE: 97 mg/dL (ref 65–99)
POTASSIUM: 3.7 mmol/L (ref 3.5–5.1)
Sodium: 137 mmol/L (ref 135–145)
TOTAL PROTEIN: 7.6 g/dL (ref 6.5–8.1)

## 2017-07-08 LAB — I-STAT CG4 LACTIC ACID, ED: Lactic Acid, Venous: 1.26 mmol/L (ref 0.5–1.9)

## 2017-07-08 MED ORDER — SODIUM CHLORIDE 0.9 % IV BOLUS (SEPSIS)
2000.0000 mL | Freq: Once | INTRAVENOUS | Status: AC
  Administered 2017-07-08: 2000 mL via INTRAVENOUS

## 2017-07-08 MED ORDER — ALBUTEROL (5 MG/ML) CONTINUOUS INHALATION SOLN
10.0000 mg/h | INHALATION_SOLUTION | Freq: Once | RESPIRATORY_TRACT | Status: AC
  Administered 2017-07-08: 10 mg/h via RESPIRATORY_TRACT
  Filled 2017-07-08: qty 20

## 2017-07-08 MED ORDER — LEVOFLOXACIN IN D5W 500 MG/100ML IV SOLN
500.0000 mg | Freq: Once | INTRAVENOUS | Status: AC
  Administered 2017-07-08: 500 mg via INTRAVENOUS
  Filled 2017-07-08: qty 100

## 2017-07-08 MED ORDER — METHYLPREDNISOLONE SODIUM SUCC 125 MG IJ SOLR
125.0000 mg | Freq: Once | INTRAMUSCULAR | Status: AC
  Administered 2017-07-08: 125 mg via INTRAVENOUS
  Filled 2017-07-08: qty 2

## 2017-07-08 MED ORDER — IPRATROPIUM BROMIDE 0.02 % IN SOLN
0.5000 mg | Freq: Once | RESPIRATORY_TRACT | Status: AC
  Administered 2017-07-08: 0.5 mg via RESPIRATORY_TRACT
  Filled 2017-07-08: qty 2.5

## 2017-07-08 NOTE — ED Triage Notes (Signed)
Sob that started a few days ago and continues to get worse.
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
## 2017-07-08 NOTE — ED Notes (Signed)
Checked with patient for urine sample,patient states that he can't go right now will try back in 30 minutes

## 2017-07-08 NOTE — Progress Notes (Signed)
Patient wanted to use mouth piece for continuous neb due to being claustrophobic. Hx of asthma , still smoking both tobacco and pot. X ray shows chronic changes in lungs. Most likely more compliant with rec drugs than asthma rx.

## 2017-07-09 MED ORDER — ALBUTEROL (5 MG/ML) CONTINUOUS INHALATION SOLN
10.0000 mg/h | INHALATION_SOLUTION | Freq: Once | RESPIRATORY_TRACT | Status: AC
  Administered 2017-07-09: 10 mg/h via RESPIRATORY_TRACT

## 2017-07-09 MED ORDER — AZITHROMYCIN 250 MG PO TABS: ORAL_TABLET | ORAL | 0 refills | Status: DC

## 2017-07-09 MED ORDER — IPRATROPIUM BROMIDE 0.02 % IN SOLN
0.5000 mg | Freq: Once | RESPIRATORY_TRACT | Status: AC
  Administered 2017-07-09: 0.5 mg via RESPIRATORY_TRACT
  Filled 2017-07-09: qty 2.5

## 2017-07-09 MED ORDER — PREDNISONE 20 MG PO TABS: ORAL_TABLET | ORAL | 0 refills | Status: DC

## 2017-07-09 NOTE — Discharge Instructions (Signed)
Take the antibiotic and prednisone as prescribed. YOU NEED TO STOP SMOKING!!! Take acetaminophen/tylenol as needed for fever. You can take claritin OTC for runny nose, sneezing. Use your inhaler and nebulizer as needed for your wheezing. Return if you get worse again.  Look at the information about preventing asthma attacks.

## 2017-07-09 NOTE — ED Notes (Signed)
Pt alert & oriented x4, stable gait. Patient given discharge instructions, paperwork & prescription(s). Patient  instructed to stop at the registration desk to finish any additional paperwork. Patient verbalized understanding. Pt left department w/ no further questions. 

## 2017-07-09 NOTE — ED Notes (Signed)
Pt ambulated around ER. O2 sats remained 95 to 97%.

## 2017-07-09 NOTE — ED Provider Notes (Signed)
12:35 AM Patient left at change of shift to recheck after his continuous nebulizer. Patient states he's had cold symptoms for couple days and wheezing not controlled by his normal nebulizers and inhalers. He states feeling better now. He is unaware that he had fever.  On exam he is able to talk without apparent dyspnea. He has good air movement however he still has diffuse wheezing and rhonchi especially at the bases.  A second continuous nebulizer was ordered, I feel probably he'll be able be discharged at that point.  01:40 AM recheck, as I'm walking down the hall I hear the patient talking to his visitor. When I entered the room he is not holding the nebulizer in his mouth. When I recheck him he has even more wheezing than when I checked him before. He states "I always have wheezing". He states he does smoke.  I'm going to have nursing staff ambulate him and see what his oxygen level does. He is insisting he wants to go home and he continued nebulizer treatments at his home.  Patient was simulated by nursing staff and they report he went from 95% on room air up to 97%. Although the patient still has some wheezing he is insistent on going home. He was discharged.  Diagnoses that have been ruled out:  None  Diagnoses that are still under consideration:  None  Final diagnoses:  Severe persistent asthma with exacerbation   New Prescriptions   AZITHROMYCIN (ZITHROMAX) 250 MG TABLET    Take 2 po the first day then once a day for the next 4 days.   PREDNISONE (DELTASONE) 20 MG TABLET    Take 3 po QD x 3d , then 2 po QD x 3d then 1 po QD x 3d    Plan discharge  Devoria Albe, MD, Concha Pyo, MD 07/09/17 [id number]

## 2017-07-13 LAB — CULTURE, BLOOD (ROUTINE X 2)
CULTURE: NO GROWTH
Culture: NO GROWTH
Special Requests: ADEQUATE
Special Requests: ADEQUATE

## 2017-10-23 ENCOUNTER — Other Ambulatory Visit: Payer: Self-pay

## 2017-10-23 ENCOUNTER — Emergency Department (HOSPITAL_COMMUNITY): Payer: Self-pay

## 2017-10-23 ENCOUNTER — Encounter (HOSPITAL_COMMUNITY): Payer: Self-pay | Admitting: *Deleted

## 2017-10-23 ENCOUNTER — Emergency Department (HOSPITAL_COMMUNITY)
Admission: EM | Admit: 2017-10-23 | Discharge: 2017-10-24 | Disposition: A | Discharge status: Home / Self Care | Payer: Self-pay | Attending: Emergency Medicine | Admitting: Emergency Medicine

## 2017-10-23 DIAGNOSIS — Z8659 Personal history of other mental and behavioral disorders: Secondary | ICD-10-CM | POA: Insufficient documentation

## 2017-10-23 DIAGNOSIS — S0081XA Abrasion of other part of head, initial encounter: Secondary | ICD-10-CM | POA: Insufficient documentation

## 2017-10-23 DIAGNOSIS — W19XXXA Unspecified fall, initial encounter: Secondary | ICD-10-CM

## 2017-10-23 DIAGNOSIS — S8262XA Displaced fracture of lateral malleolus of left fibula, initial encounter for closed fracture: Secondary | ICD-10-CM | POA: Insufficient documentation

## 2017-10-23 DIAGNOSIS — Y999 Unspecified external cause status: Secondary | ICD-10-CM | POA: Insufficient documentation

## 2017-10-23 DIAGNOSIS — F10120 Alcohol abuse with intoxication, uncomplicated: Secondary | ICD-10-CM | POA: Insufficient documentation

## 2017-10-23 DIAGNOSIS — W010XXA Fall on same level from slipping, tripping and stumbling without subsequent striking against object, initial encounter: Secondary | ICD-10-CM | POA: Insufficient documentation

## 2017-10-23 DIAGNOSIS — J45909 Unspecified asthma, uncomplicated: Secondary | ICD-10-CM | POA: Insufficient documentation

## 2017-10-23 DIAGNOSIS — Y92828 Other wilderness area as the place of occurrence of the external cause: Secondary | ICD-10-CM | POA: Insufficient documentation

## 2017-10-23 DIAGNOSIS — S62334A Displaced fracture of neck of fourth metacarpal bone, right hand, initial encounter for closed fracture: Secondary | ICD-10-CM | POA: Insufficient documentation

## 2017-10-23 DIAGNOSIS — Y9302 Activity, running: Secondary | ICD-10-CM | POA: Insufficient documentation

## 2017-10-23 DIAGNOSIS — F10920 Alcohol use, unspecified with intoxication, uncomplicated: Secondary | ICD-10-CM

## 2017-10-23 DIAGNOSIS — F17210 Nicotine dependence, cigarettes, uncomplicated: Secondary | ICD-10-CM | POA: Insufficient documentation

## 2017-10-23 LAB — COMPREHENSIVE METABOLIC PANEL
ALBUMIN: 4.2 g/dL (ref 3.5–5.0)
ALT: 94 U/L — AB (ref 17–63)
ANION GAP: 15 (ref 5–15)
AST: 99 U/L — ABNORMAL HIGH (ref 15–41)
Alkaline Phosphatase: 89 U/L (ref 38–126)
BUN: 12 mg/dL (ref 6–20)
CHLORIDE: 109 mmol/L (ref 101–111)
CO2: 15 mmol/L — AB (ref 22–32)
CREATININE: 1.08 mg/dL (ref 0.61–1.24)
Calcium: 8.6 mg/dL — ABNORMAL LOW (ref 8.9–10.3)
GFR calc non Af Amer: >60 mL/min (ref >60)
GLUCOSE: 103 mg/dL — AB (ref 65–99)
Potassium: 3.9 mmol/L (ref 3.5–5.1)
SODIUM: 139 mmol/L (ref 135–145)
Total Bilirubin: 0.4 mg/dL (ref 0.3–1.2)
Total Protein: 7.2 g/dL (ref 6.5–8.1)

## 2017-10-23 LAB — CBC WITH DIFFERENTIAL/PLATELET
BASOS ABS: 0 10*3/uL (ref 0.0–0.1)
BASOS PCT: 0 %
EOS PCT: 2 %
Eosinophils Absolute: 0.3 10*3/uL (ref 0.0–0.7)
HEMATOCRIT: 44.3 % (ref 39.0–52.0)
Hemoglobin: 15 g/dL (ref 13.0–17.0)
Lymphocytes Relative: 9 %
Lymphs Abs: 1.1 10*3/uL (ref 0.7–4.0)
MCH: 29.2 pg (ref 26.0–34.0)
MCHC: 33.9 g/dL (ref 30.0–36.0)
MCV: 86.2 fL (ref 78.0–100.0)
MONO ABS: 1 10*3/uL (ref 0.1–1.0)
Monocytes Relative: 9 %
NEUTROS ABS: 9.1 10*3/uL — AB (ref 1.7–7.7)
Neutrophils Relative %: 80 %
PLATELETS: 278 10*3/uL (ref 150–400)
RBC: 5.14 MIL/uL (ref 4.22–5.81)
RDW: 12.7 % (ref 11.5–15.5)
WBC: 11.5 10*3/uL — AB (ref 4.0–10.5)

## 2017-10-23 LAB — ETHANOL: Alcohol, Ethyl (B): 213 mg/dL — ABNORMAL HIGH (ref <10)

## 2017-10-23 LAB — CK: CK TOTAL: 478 U/L — AB (ref 49–397)

## 2017-10-23 MED ORDER — SODIUM CHLORIDE 0.9 % IV BOLUS (SEPSIS)
1000.0000 mL | Freq: Once | INTRAVENOUS | Status: AC
  Administered 2017-10-23: 1000 mL via INTRAVENOUS
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
## 2017-10-24 ENCOUNTER — Emergency Department (HOSPITAL_COMMUNITY): Payer: Self-pay

## 2017-10-24 MED ORDER — GI COCKTAIL ~~LOC~~
30.0000 mL | Freq: Once | ORAL | Status: AC
  Administered 2017-10-24: 30 mL via ORAL

## 2017-10-24 MED ORDER — HYDROCODONE-ACETAMINOPHEN 5-325 MG PO TABS: 1.0000 | ORAL_TABLET | ORAL | 0 refills | Status: DC | PRN

## 2017-10-24 MED ORDER — GI COCKTAIL ~~LOC~~
ORAL | Status: AC
  Filled 2017-10-24: qty 30

## 2017-10-24 NOTE — ED Notes (Signed)
Pt states that he cannot find his pants and that his wallet is in his pants. Pt's jeans were found behind stretcher but no wallet was in his pants. Spoke with [facility] that brought pt in and they stated they did not see a wallet.

## 2017-10-24 NOTE — Discharge Instructions (Signed)
Keep the splint in place. Follow up with Dr. Romeo AppleHarrison in the office. Return to the ED if you develop new or worsening symptoms

## 2017-10-24 NOTE — ED Notes (Signed)
NO HAND SURGEON ON CALL

## 2017-10-25 ENCOUNTER — Telehealth: Payer: Self-pay | Admitting: Orthopedic Surgery

## 2017-10-25 NOTE — Telephone Encounter (Signed)
Called back to patient, states will contact Hydrographic surveyorhand surgeon.

## 2017-10-25 NOTE — Telephone Encounter (Signed)
Hand surgeon best choice.

## 2017-10-25 NOTE — Telephone Encounter (Signed)
Patient called, requests appointment [facility] for main problem, fractures of fingers, right hand. Please review and advise regarding scheduling at our clinic, or if hand specialist recommended?  Notes indicate: "FINDINGS: Partially visualized acute fracture of fourth distal metacarpal impacted and volar angulation again seen. Flexion of fifth proximal interphalangeal joint. No acute fracture or dislocation.  IMPRESSION: 1. Fifth PIP flexion may represent ligamentous injury. No fifth digit fracture or dislocation identified. 2. Partially visualized fourth distal metacarpal impacted and angulated fracture."

## 2017-10-31 ENCOUNTER — Encounter (INDEPENDENT_AMBULATORY_CARE_PROVIDER_SITE_OTHER): Payer: Self-pay | Admitting: Orthopaedic Surgery

## 2017-10-31 ENCOUNTER — Ambulatory Visit (INDEPENDENT_AMBULATORY_CARE_PROVIDER_SITE_OTHER): Payer: Self-pay | Admitting: Orthopaedic Surgery

## 2017-10-31 ENCOUNTER — Ambulatory Visit (INDEPENDENT_AMBULATORY_CARE_PROVIDER_SITE_OTHER): Payer: Self-pay

## 2017-10-31 DIAGNOSIS — M79641 Pain in right hand: Secondary | ICD-10-CM

## 2017-10-31 DIAGNOSIS — S62334A Displaced fracture of neck of fourth metacarpal bone, right hand, initial encounter for closed fracture: Secondary | ICD-10-CM
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
## 2017-11-14 ENCOUNTER — Encounter (INDEPENDENT_AMBULATORY_CARE_PROVIDER_SITE_OTHER): Payer: Self-pay | Admitting: Orthopaedic Surgery

## 2017-11-14 ENCOUNTER — Ambulatory Visit (INDEPENDENT_AMBULATORY_CARE_PROVIDER_SITE_OTHER): Payer: Self-pay | Admitting: Orthopaedic Surgery

## 2017-11-14 ENCOUNTER — Ambulatory Visit (INDEPENDENT_AMBULATORY_CARE_PROVIDER_SITE_OTHER): Payer: Self-pay

## 2017-11-14 DIAGNOSIS — S62334A Displaced fracture of neck of fourth metacarpal bone, right hand, initial encounter for closed fracture: Secondary | ICD-10-CM

## 2017-11-14 NOTE — Progress Notes (Signed)
Patient follows up today for his fourth metacarpal fracture.  He took off his cast himself couple days ago.  He has mild pain.  He is currently about 3 weeks from the initial injury.  His x-rays show alignment.  At this point we will send him to hand therapy for custom ulnar gutter removable splint and initiation of hand therapy.  Follow-up in 3 weeks for recheck and three-view x-rays of the right hand.

## 2017-11-16 ENCOUNTER — Other Ambulatory Visit: Payer: Self-pay

## 2017-11-16 ENCOUNTER — Ambulatory Visit: Payer: BLUE CROSS/BLUE SHIELD | Attending: Orthopaedic Surgery | Admitting: Occupational Therapy

## 2017-11-16 DIAGNOSIS — M25641 Stiffness of right hand, not elsewhere classified: Secondary | ICD-10-CM | POA: Diagnosis present

## 2017-11-16 DIAGNOSIS — M6281 Muscle weakness (generalized): Secondary | ICD-10-CM | POA: Diagnosis present

## 2017-11-16 DIAGNOSIS — M79641 Pain in right hand: Secondary | ICD-10-CM | POA: Diagnosis present
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
## 2017-11-16 NOTE — Patient Instructions (Signed)
WEARING SCHEDULE:  Wear splint at ALL times except for hygiene care (May remove splint for exercises below and then immediately place back on) PURPOSE:  To prevent movement and for protection until injury can heal  CARE OF SPLINT:  Keep splint away from heat sources including: stove, radiator or furnace, or a car in sunlight. The splint can melt and will no longer fit you properly  Keep away from pets and children  Clean the splint with rubbing alcohol 1-2 times per day.  * During this time, make sure you also clean your hand/arm as instructed by your therapist and/or perform dressing changes as needed. Then dry hand/arm completely before replacing splint. (When cleaning hand/arm, keep it immobilized in same position until splint is replaced)  PRECAUTIONS/POTENTIAL PROBLEMS: *If you notice or experience increased pain, swelling, numbness, or a lingering reddened area from the splint: Contact your therapist immediately by calling [phone]. You must wear the splint for protection, but we will get you scheduled for adjustments as quickly as possible.  (If only straps or hooks need to be replaced and NO adjustments to the splint need to be made, just call the office ahead and let them know you are coming in)  If you have any medical concerns or signs of infection, please call your doctor immediately  MP Flexion (Active)    Bend large knuckles as far as they will go, keeping small joints straight like a duck bill or roof top. Repeat _15___ times. Do __4-6__ sessions per day.   Flexor Tendon Gliding (Active Hook Fist)   With fingers and knuckles straight, bend middle and tip joints. Do not bend large knuckles. Repeat _15___ times. Do _4-6___ sessions per day.       Finger Flexion / Extension   Bend fingers of left hand toward palm, making a  fist. Straighten fingers, opening fist. Repeat sequence _10-15___ times per session. Do _4-6__ sessions per day.

## 2017-11-20 ENCOUNTER — Telehealth (INDEPENDENT_AMBULATORY_CARE_PROVIDER_SITE_OTHER): Payer: Self-pay

## 2017-11-20 NOTE — Telephone Encounter (Signed)
Yes its ok

## 2017-11-20 NOTE — Telephone Encounter (Signed)
Tresa EndoKelly Occupational therapist from [facility]. Saw patient for splinting and hand therapy. Made ulnar gutter splint  And she initiated AROM. Okay to start PROM in 2 weeks?  She knows patient wasn't really immobilized for 4 weeks since he took cast off. States patient is not very compliant. Wants to make sure it okay to start PROM in 2 weeks.    CB [phone]

## 2017-11-20 NOTE — Telephone Encounter (Signed)
Called Tresa EndoKelly to advise on message

## 2017-11-20 NOTE — Therapy (Addendum)
[facility] [address] [place], [place], [address] Phone: [phone]   Fax:  [phone]  Patient Details  Name: [name] AreaJoshua W Blomgren MRN: [id number] Date of Birth: 08/15/1986 Referring Provider:  Dr. Gershon MusselNaiping Xu Encounter Date: 11/20/2017  Received telephone call from Dr. Warren DanesXu's office [facility] on 11/20/17 at 12:50 p.m. clarifying it was ok to begin P/ROM for this patient in 2 weeks from O.T. evaluation date.   Kelli ChurnBallie, Gael Delude Johnson, OTR/L 11/20/2017, 1:08 PM  [facility] [address] [place], [place], [address] Phone: [phone]   Fax:  [phone]

## 2017-11-27 ENCOUNTER — Encounter: Payer: Self-pay | Admitting: Occupational Therapy

## 2017-11-29 ENCOUNTER — Ambulatory Visit: Payer: BLUE CROSS/BLUE SHIELD | Attending: Orthopaedic Surgery | Admitting: Occupational Therapy

## 2017-11-29 DIAGNOSIS — M6281 Muscle weakness (generalized): Secondary | ICD-10-CM

## 2017-11-29 DIAGNOSIS — M25641 Stiffness of right hand, not elsewhere classified: Secondary | ICD-10-CM | POA: Insufficient documentation
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
## 2017-11-29 NOTE — Patient Instructions (Signed)
MP Extension (Active)    With palm on table, straighten fingers completely at large knuckles, and lift fingers off table together (buddy strap ring to middle finger). Hold __3__ seconds. Repeat _10___ times. Do __6__ sessions per day. Activity: Tap fingers one at a time on table

## 2017-12-05 ENCOUNTER — Ambulatory Visit (INDEPENDENT_AMBULATORY_CARE_PROVIDER_SITE_OTHER): Payer: BLUE CROSS/BLUE SHIELD

## 2017-12-05 ENCOUNTER — Ambulatory Visit (INDEPENDENT_AMBULATORY_CARE_PROVIDER_SITE_OTHER): Payer: BLUE CROSS/BLUE SHIELD | Admitting: Orthopaedic Surgery

## 2017-12-05 ENCOUNTER — Encounter (INDEPENDENT_AMBULATORY_CARE_PROVIDER_SITE_OTHER): Payer: Self-pay | Admitting: Orthopaedic Surgery

## 2017-12-05 DIAGNOSIS — S62334A Displaced fracture of neck of fourth metacarpal bone, right hand, initial encounter for closed fracture: Secondary | ICD-10-CM

## 2017-12-05 NOTE — Progress Notes (Signed)
[name] Hall is 6 weeks status post fourth metacarpal fracture.  He is doing very well at this point.  He denies any pain and is not taking any medicines.  On exam he has full range of motion is able to make a full composite fist.  At this point I am going to release him to full activity as tolerated.  His x-rays demonstrate healing.  Questions encouraged and answered.  Follow-up as needed.

## 2017-12-11 ENCOUNTER — Ambulatory Visit: Payer: BLUE CROSS/BLUE SHIELD | Admitting: Occupational Therapy

## 2017-12-12 ENCOUNTER — Emergency Department (HOSPITAL_COMMUNITY)
Admission: EM | Admit: 2017-12-12 | Discharge: 2017-12-12 | Discharge status: Against Medical Advice | Payer: BLUE CROSS/BLUE SHIELD | Attending: Emergency Medicine | Admitting: Emergency Medicine

## 2017-12-12 ENCOUNTER — Other Ambulatory Visit: Payer: Self-pay

## 2017-12-12 ENCOUNTER — Encounter (HOSPITAL_COMMUNITY): Payer: Self-pay | Admitting: Emergency Medicine

## 2017-12-12 ENCOUNTER — Emergency Department (HOSPITAL_COMMUNITY): Payer: BLUE CROSS/BLUE SHIELD

## 2017-12-12 DIAGNOSIS — Z5321 Procedure and treatment not carried out due to patient leaving prior to being seen by health care provider: Secondary | ICD-10-CM | POA: Diagnosis not present

## 2017-12-12 DIAGNOSIS — R062 Wheezing: Secondary | ICD-10-CM | POA: Insufficient documentation

## 2017-12-12 MED ORDER — IPRATROPIUM-ALBUTEROL 0.5-2.5 (3) MG/3ML IN SOLN
3.0000 mL | Freq: Once | RESPIRATORY_TRACT | Status: AC
  Administered 2017-12-12: 3 mL via RESPIRATORY_TRACT
  Filled 2017-12-12: qty 3

## 2017-12-12 NOTE — ED Triage Notes (Addendum)
Pt c/o asthma flare up started last night . Wheezing throughout. Pt able to finish sentences

## 2017-12-12 NOTE — ED Notes (Signed)
Rt aware of tx 

## 2017-12-12 NOTE — ED Notes (Signed)
Pt stated he was leaving to registration

## 2017-12-12 NOTE — ED Notes (Signed)
C/o cough with green mucus

## 2018-01-03 NOTE — Therapy (Signed)
[facility] [address] [place], [place], [address] Phone: [phone]   Fax:  [phone]  Patient Details  Name: [name] Hall MRN: [id number] Date of Birth: 06-29-1986 Referring Provider:  Dr. Frankey Shown Encounter Date: 01/03/2018  OCCUPATIONAL THERAPY DISCHARGE SUMMARY  Visits from Start of Care: 2  Current functional level related to goals / functional outcomes: OT Short Term Goals - 11/29/17 1317      OT SHORT TERM GOAL #1   Title  Independent with splint wear and care    Time  4    Period  Weeks    Status  Achieved      OT SHORT TERM GOAL #2   Title  Independent with initial HEP     Time  4    Period  Weeks    Status  Achieved      OT SHORT TERM GOAL #3   Title  Pt will verbalize understanding of pain reduction strategies    Time  4    Period  Weeks    Status unknown     **Pt did not met any LTG's secondary to not returning after 2nd visit on 11/29/17   Remaining deficits: unknown   Education / Equipment: Splint wear and care, HEP, precautions  Plan: Patient agrees to discharge.  Patient goals were not met. Patient is being discharged due to not returning since the last visit.  ?????      Carey Bullocks, OTR/L 01/03/2018, 8:31 AM  [facility] [address] [place], [place], [address] Phone: [phone]   Fax:  [phone]

## 2018-09-23 DIAGNOSIS — S069X9A Unspecified intracranial injury with loss of consciousness of unspecified duration, initial encounter: Secondary | ICD-10-CM

## 2018-09-23 DIAGNOSIS — S069XAA Unspecified intracranial injury with loss of consciousness status unknown, initial encounter: Secondary | ICD-10-CM

## 2018-09-23 HISTORY — DX: Unspecified intracranial injury with loss of consciousness of unspecified duration, initial encounter: S06.9X9A

## 2018-09-23 HISTORY — DX: Unspecified intracranial injury with loss of consciousness status unknown, initial encounter: S06.9XAA

## 2018-10-22 ENCOUNTER — Emergency Department (HOSPITAL_COMMUNITY): Payer: BLUE CROSS/BLUE SHIELD | Admitting: Registered Nurse

## 2018-10-22 ENCOUNTER — Emergency Department (HOSPITAL_COMMUNITY): Payer: BLUE CROSS/BLUE SHIELD

## 2018-10-22 ENCOUNTER — Inpatient Hospital Stay (HOSPITAL_COMMUNITY): Payer: BLUE CROSS/BLUE SHIELD

## 2018-10-22 ENCOUNTER — Other Ambulatory Visit: Payer: Self-pay

## 2018-10-22 ENCOUNTER — Inpatient Hospital Stay (HOSPITAL_COMMUNITY)
Admission: EM | Admit: 2018-10-22 | Discharge: 2018-11-06 | DRG: 957 | Disposition: A | Discharge status: Rehabilitation Facility | Payer: BLUE CROSS/BLUE SHIELD | Attending: Otolaryngology | Admitting: Otolaryngology

## 2018-10-22 ENCOUNTER — Encounter (HOSPITAL_COMMUNITY): Payer: Self-pay | Admitting: Radiology

## 2018-10-22 ENCOUNTER — Encounter (HOSPITAL_COMMUNITY): Admission: EM | Disposition: A | Discharge status: Rehabilitation Facility | Payer: Self-pay | Source: Home / Self Care

## 2018-10-22 DIAGNOSIS — R0682 Tachypnea, not elsewhere classified: Secondary | ICD-10-CM | POA: Diagnosis not present

## 2018-10-22 DIAGNOSIS — S270XXA Traumatic pneumothorax, initial encounter: Secondary | ICD-10-CM | POA: Diagnosis present

## 2018-10-22 DIAGNOSIS — S92331A Displaced fracture of third metatarsal bone, right foot, initial encounter for closed fracture: Secondary | ICD-10-CM | POA: Diagnosis present

## 2018-10-22 DIAGNOSIS — Z9911 Dependence on respirator [ventilator] status: Secondary | ICD-10-CM | POA: Diagnosis not present

## 2018-10-22 DIAGNOSIS — J939 Pneumothorax, unspecified: Secondary | ICD-10-CM

## 2018-10-22 DIAGNOSIS — S069X2S Unspecified intracranial injury with loss of consciousness of 31 minutes to 59 minutes, sequela: Secondary | ICD-10-CM | POA: Diagnosis not present

## 2018-10-22 DIAGNOSIS — F17200 Nicotine dependence, unspecified, uncomplicated: Secondary | ICD-10-CM | POA: Diagnosis present

## 2018-10-22 DIAGNOSIS — Y92410 Unspecified street and highway as the place of occurrence of the external cause: Secondary | ICD-10-CM

## 2018-10-22 DIAGNOSIS — D72829 Elevated white blood cell count, unspecified: Secondary | ICD-10-CM | POA: Diagnosis not present

## 2018-10-22 DIAGNOSIS — J440 Chronic obstructive pulmonary disease with acute lower respiratory infection: Secondary | ICD-10-CM | POA: Diagnosis not present

## 2018-10-22 DIAGNOSIS — S92321A Displaced fracture of second metatarsal bone, right foot, initial encounter for closed fracture: Secondary | ICD-10-CM | POA: Diagnosis present

## 2018-10-22 DIAGNOSIS — R569 Unspecified convulsions: Secondary | ICD-10-CM | POA: Diagnosis present

## 2018-10-22 DIAGNOSIS — F0781 Postconcussional syndrome: Secondary | ICD-10-CM | POA: Diagnosis present

## 2018-10-22 DIAGNOSIS — I959 Hypotension, unspecified: Secondary | ICD-10-CM | POA: Diagnosis present

## 2018-10-22 DIAGNOSIS — S93111A Dislocation of interphalangeal joint of right great toe, initial encounter: Secondary | ICD-10-CM

## 2018-10-22 DIAGNOSIS — J45909 Unspecified asthma, uncomplicated: Secondary | ICD-10-CM | POA: Diagnosis not present

## 2018-10-22 DIAGNOSIS — S02412A LeFort II fracture, initial encounter for closed fracture: Secondary | ICD-10-CM | POA: Diagnosis present

## 2018-10-22 DIAGNOSIS — S0240EA Zygomatic fracture, right side, initial encounter for closed fracture: Secondary | ICD-10-CM | POA: Diagnosis present

## 2018-10-22 DIAGNOSIS — Z79899 Other long term (current) drug therapy: Secondary | ICD-10-CM

## 2018-10-22 DIAGNOSIS — S0181XA Laceration without foreign body of other part of head, initial encounter: Secondary | ICD-10-CM | POA: Diagnosis present

## 2018-10-22 DIAGNOSIS — S92321S Displaced fracture of second metatarsal bone, right foot, sequela: Secondary | ICD-10-CM | POA: Diagnosis not present

## 2018-10-22 DIAGNOSIS — S0121XA Laceration without foreign body of nose, initial encounter: Secondary | ICD-10-CM | POA: Diagnosis present

## 2018-10-22 DIAGNOSIS — E876 Hypokalemia: Secondary | ICD-10-CM | POA: Diagnosis not present

## 2018-10-22 DIAGNOSIS — R561 Post traumatic seizures: Secondary | ICD-10-CM | POA: Diagnosis present

## 2018-10-22 DIAGNOSIS — R4689 Other symptoms and signs involving appearance and behavior: Secondary | ICD-10-CM | POA: Diagnosis not present

## 2018-10-22 DIAGNOSIS — Z419 Encounter for procedure for purposes other than remedying health state, unspecified: Secondary | ICD-10-CM

## 2018-10-22 DIAGNOSIS — S0292XS Unspecified fracture of facial bones, sequela: Secondary | ICD-10-CM | POA: Diagnosis not present

## 2018-10-22 DIAGNOSIS — Z23 Encounter for immunization: Secondary | ICD-10-CM

## 2018-10-22 DIAGNOSIS — S069X1S Unspecified intracranial injury with loss of consciousness of 30 minutes or less, sequela: Secondary | ICD-10-CM | POA: Diagnosis not present

## 2018-10-22 DIAGNOSIS — Z9689 Presence of other specified functional implants: Secondary | ICD-10-CM

## 2018-10-22 DIAGNOSIS — R1312 Dysphagia, oropharyngeal phase: Secondary | ICD-10-CM | POA: Diagnosis not present

## 2018-10-22 DIAGNOSIS — J15211 Pneumonia due to Methicillin susceptible Staphylococcus aureus: Secondary | ICD-10-CM | POA: Diagnosis not present

## 2018-10-22 DIAGNOSIS — S0240FA Zygomatic fracture, left side, initial encounter for closed fracture: Secondary | ICD-10-CM | POA: Diagnosis present

## 2018-10-22 DIAGNOSIS — S45101A Unspecified injury of brachial artery, right side, initial encounter: Secondary | ICD-10-CM | POA: Diagnosis present

## 2018-10-22 DIAGNOSIS — R1310 Dysphagia, unspecified: Secondary | ICD-10-CM | POA: Diagnosis present

## 2018-10-22 DIAGNOSIS — R339 Retention of urine, unspecified: Secondary | ICD-10-CM | POA: Diagnosis present

## 2018-10-22 DIAGNOSIS — M7989 Other specified soft tissue disorders: Secondary | ICD-10-CM | POA: Diagnosis not present

## 2018-10-22 DIAGNOSIS — S069X0S Unspecified intracranial injury without loss of consciousness, sequela: Secondary | ICD-10-CM | POA: Diagnosis not present

## 2018-10-22 DIAGNOSIS — J9690 Respiratory failure, unspecified, unspecified whether with hypoxia or hypercapnia: Secondary | ICD-10-CM

## 2018-10-22 DIAGNOSIS — S45111A Laceration of brachial artery, right side, initial encounter: Principal | ICD-10-CM | POA: Diagnosis present

## 2018-10-22 DIAGNOSIS — J9601 Acute respiratory failure with hypoxia: Secondary | ICD-10-CM | POA: Diagnosis present

## 2018-10-22 DIAGNOSIS — D62 Acute posthemorrhagic anemia: Secondary | ICD-10-CM | POA: Diagnosis present

## 2018-10-22 DIAGNOSIS — S45191A Other specified injury of brachial artery, right side, initial encounter: Secondary | ICD-10-CM | POA: Diagnosis not present

## 2018-10-22 DIAGNOSIS — Z4659 Encounter for fitting and adjustment of other gastrointestinal appliance and device: Secondary | ICD-10-CM

## 2018-10-22 DIAGNOSIS — S022XXA Fracture of nasal bones, initial encounter for closed fracture: Secondary | ICD-10-CM | POA: Diagnosis present

## 2018-10-22 HISTORY — DX: Unspecified asthma, uncomplicated: J45.909

## 2018-10-22 HISTORY — PX: WOUND EXPLORATION: SHX6188

## 2018-10-22 LAB — COMPREHENSIVE METABOLIC PANEL
ALT: 48 U/L — ABNORMAL HIGH (ref 0–44)
AST: 108 U/L — AB (ref 15–41)
Albumin: 4.8 g/dL (ref 3.5–5.0)
Alkaline Phosphatase: 70 U/L (ref 38–126)
Anion gap: 24 — ABNORMAL HIGH (ref 5–15)
BUN: 11 mg/dL (ref 6–20)
CO2: 10 mmol/L — ABNORMAL LOW (ref 22–32)
Calcium: 9.6 mg/dL (ref 8.9–10.3)
Chloride: 108 mmol/L (ref 98–111)
Creatinine, Ser: 1.48 mg/dL — ABNORMAL HIGH (ref 0.61–1.24)
Glucose, Bld: 192 mg/dL — ABNORMAL HIGH (ref 70–99)
Potassium: 3.5 mmol/L (ref 3.5–5.1)
Sodium: 142 mmol/L (ref 135–145)
Total Bilirubin: 0.8 mg/dL (ref 0.3–1.2)
Total Protein: 7.4 g/dL (ref 6.5–8.1)

## 2018-10-22 LAB — CBC
HCT: 48.7 % (ref 39.0–52.0)
Hemoglobin: 15.8 g/dL (ref 13.0–17.0)
MCH: 29.1 pg (ref 26.0–34.0)
MCHC: 32.4 g/dL (ref 30.0–36.0)
MCV: 89.7 fL (ref 80.0–100.0)
NRBC: 0 % (ref 0.0–0.2)
Platelets: 410 10*3/uL — ABNORMAL HIGH (ref 150–400)
RBC: 5.43 MIL/uL (ref 4.22–5.81)
RDW: 12.1 % (ref 11.5–15.5)
WBC: 17.1 10*3/uL — AB (ref 4.0–10.5)

## 2018-10-22 LAB — POCT I-STAT 4, (NA,K, GLUC, HGB,HCT)
Glucose, Bld: 271 mg/dL — ABNORMAL HIGH (ref 70–99)
HCT: 43 % (ref 39.0–52.0)
Hemoglobin: 14.6 g/dL (ref 13.0–17.0)
Potassium: 4.4 mmol/L (ref 3.5–5.1)
Sodium: 137 mmol/L (ref 135–145)

## 2018-10-22 LAB — POCT I-STAT 7, (LYTES, BLD GAS, ICA,H+H)
Acid-base deficit: 12 mmol/L — ABNORMAL HIGH (ref 0.0–2.0)
Bicarbonate: 19 mmol/L — ABNORMAL LOW (ref 20.0–28.0)
Calcium, Ion: 1.21 mmol/L (ref 1.15–1.40)
HCT: 32 % — ABNORMAL LOW (ref 39.0–52.0)
Hemoglobin: 10.9 g/dL — ABNORMAL LOW (ref 13.0–17.0)
O2 Saturation: 97 %
POTASSIUM: 4.1 mmol/L (ref 3.5–5.1)
Patient temperature: 35
Sodium: 141 mmol/L (ref 135–145)
TCO2: 21 mmol/L — ABNORMAL LOW (ref 22–32)
pCO2 arterial: 63.5 mmHg — ABNORMAL HIGH (ref 32.0–48.0)
pH, Arterial: 7.07 — CL (ref 7.350–7.450)
pO2, Arterial: 115 mmHg — ABNORMAL HIGH (ref 83.0–108.0)

## 2018-10-22 LAB — PROTIME-INR
INR: 1.22
Prothrombin Time: 15.3 seconds — ABNORMAL HIGH (ref 11.4–15.2)

## 2018-10-22 LAB — CDS SEROLOGY

## 2018-10-22 LAB — POCT I-STAT 3, ART BLOOD GAS (G3+)
Acid-base deficit: 4 mmol/L — ABNORMAL HIGH (ref 0.0–2.0)
BICARBONATE: 26.6 mmol/L (ref 20.0–28.0)
O2 Saturation: 100 %
PCO2 ART: 78.3 mmHg — AB (ref 32.0–48.0)
Patient temperature: 98.6
TCO2: 29 mmol/L (ref 22–32)
pH, Arterial: 7.139 — CL (ref 7.350–7.450)
pO2, Arterial: 239 mmHg — ABNORMAL HIGH (ref 83.0–108.0)

## 2018-10-22 LAB — I-STAT CHEM 8, ED
BUN: 13 mg/dL (ref 6–20)
Calcium, Ion: 1.17 mmol/L (ref 1.15–1.40)
Chloride: 110 mmol/L (ref 98–111)
Creatinine, Ser: 1.2 mg/dL (ref 0.61–1.24)
Glucose, Bld: 185 mg/dL — ABNORMAL HIGH (ref 70–99)
HCT: 50 % (ref 39.0–52.0)
Hemoglobin: 17 g/dL (ref 13.0–17.0)
Potassium: 3.4 mmol/L — ABNORMAL LOW (ref 3.5–5.1)
Sodium: 141 mmol/L (ref 135–145)
TCO2: 12 mmol/L — ABNORMAL LOW (ref 22–32)

## 2018-10-22 LAB — GLUCOSE, CAPILLARY: GLUCOSE-CAPILLARY: 125 mg/dL — AB (ref 70–99)

## 2018-10-22 LAB — ETHANOL: Alcohol, Ethyl (B): <10 mg/dL (ref <10)

## 2018-10-22 LAB — ABO/RH: ABO/RH(D): O POS

## 2018-10-22 LAB — I-STAT CG4 LACTIC ACID, ED: Lactic Acid, Venous: 15.11 mmol/L (ref 0.5–1.9)

## 2018-10-22 SURGERY — WOUND EXPLORATION
Anesthesia: General | Laterality: Right

## 2018-10-22 MED ORDER — FENTANYL CITRATE (PF) 100 MCG/2ML IJ SOLN
50.0000 ug | Freq: Once | INTRAMUSCULAR | Status: AC
  Administered 2018-10-22: 50 ug via INTRAVENOUS
  Filled 2018-10-22: qty 2

## 2018-10-22 MED ORDER — INSULIN ASPART 100 UNIT/ML ~~LOC~~ SOLN
0.0000 [IU] | Freq: Three times a day (TID) | SUBCUTANEOUS | Status: DC
  Administered 2018-10-27: 1 [IU] via SUBCUTANEOUS

## 2018-10-22 MED ORDER — PROPOFOL 1000 MG/100ML IV EMUL
INTRAVENOUS | Status: AC
  Filled 2018-10-22: qty 100

## 2018-10-22 MED ORDER — MIDAZOLAM HCL 5 MG/5ML IJ SOLN
INTRAMUSCULAR | Status: DC | PRN
  Administered 2018-10-22: 2 mg via INTRAVENOUS

## 2018-10-22 MED ORDER — PROPOFOL 10 MG/ML IV BOLUS
INTRAVENOUS | Status: AC
  Filled 2018-10-22: qty 20

## 2018-10-22 MED ORDER — CEFAZOLIN SODIUM-DEXTROSE 2-4 GM/100ML-% IV SOLN
INTRAVENOUS | Status: AC
  Administered 2018-10-23: 2 g via INTRAVENOUS
  Filled 2018-10-22: qty 100

## 2018-10-22 MED ORDER — FENTANYL CITRATE (PF) 250 MCG/5ML IJ SOLN
INTRAMUSCULAR | Status: AC
  Filled 2018-10-22: qty 5

## 2018-10-22 MED ORDER — ORAL CARE MOUTH RINSE
15.0000 mL | OROMUCOSAL | Status: DC
  Administered 2018-10-23 – 2018-11-03 (×114): 15 mL via OROMUCOSAL

## 2018-10-22 MED ORDER — LEVETIRACETAM IN NACL 500 MG/100ML IV SOLN: 500.0000 mg | Freq: Two times a day (BID) | INTRAVENOUS | Status: DC

## 2018-10-22 MED ORDER — SODIUM CHLORIDE 0.9 % IV SOLN
INTRAVENOUS | Status: DC | PRN
  Administered 2018-10-22 (×3): via INTRAVENOUS

## 2018-10-22 MED ORDER — PROTAMINE SULFATE 10 MG/ML IV SOLN
INTRAVENOUS | Status: DC | PRN
  Administered 2018-10-22: 40 mg via INTRAVENOUS

## 2018-10-22 MED ORDER — PROPOFOL 1000 MG/100ML IV EMUL
0.0000 ug/kg/min | INTRAVENOUS | Status: DC
  Administered 2018-10-22: 19:00:00 via INTRAVENOUS
  Administered 2018-10-23 – 2018-10-24 (×7): 50 ug/kg/min via INTRAVENOUS
  Administered 2018-10-24: 45 ug/kg/min via INTRAVENOUS
  Administered 2018-10-24: 50 ug/kg/min via INTRAVENOUS
  Administered 2018-10-24: 45 ug/kg/min via INTRAVENOUS
  Administered 2018-10-24 (×2): 50 ug/kg/min via INTRAVENOUS
  Administered 2018-10-25: 30 ug/kg/min via INTRAVENOUS
  Administered 2018-10-25: 25 ug/kg/min via INTRAVENOUS
  Administered 2018-10-25: 50 ug/kg/min via INTRAVENOUS
  Administered 2018-10-26: 45 ug/kg/min via INTRAVENOUS
  Administered 2018-10-26: 35 ug/kg/min via INTRAVENOUS
  Administered 2018-10-26: 50 ug/kg/min via INTRAVENOUS
  Administered 2018-10-26 (×2): 35 ug/kg/min via INTRAVENOUS
  Administered 2018-10-27 (×5): 50 ug/kg/min via INTRAVENOUS
  Administered 2018-10-28: 40 ug/kg/min via INTRAVENOUS
  Administered 2018-10-28 – 2018-10-29 (×4): 50 ug/kg/min via INTRAVENOUS
  Administered 2018-10-29 – 2018-10-31 (×8): 40 ug/kg/min via INTRAVENOUS
  Administered 2018-10-31: 50 ug/kg/min via INTRAVENOUS
  Administered 2018-10-31: 40 ug/kg/min via INTRAVENOUS
  Administered 2018-10-31 – 2018-11-01 (×5): 50 ug/kg/min via INTRAVENOUS
  Filled 2018-10-22 (×45): qty 100

## 2018-10-22 MED ORDER — FENTANYL CITRATE (PF) 100 MCG/2ML IJ SOLN: 50.0000 ug | Freq: Once | INTRAMUSCULAR | Status: DC

## 2018-10-22 MED ORDER — ACETAMINOPHEN 325 MG PO TABS
325.0000 mg | ORAL_TABLET | ORAL | Status: DC | PRN
  Administered 2018-10-28 – 2018-10-31 (×3): 650 mg via ORAL
  Filled 2018-10-22 (×4): qty 2

## 2018-10-22 MED ORDER — SODIUM CHLORIDE 0.9 % IV SOLN
INTRAVENOUS | Status: DC | PRN
  Administered 2018-10-22 (×3): via INTRAVENOUS

## 2018-10-22 MED ORDER — IOHEXOL 300 MG/ML  SOLN
100.0000 mL | Freq: Once | INTRAMUSCULAR | Status: AC | PRN
  Administered 2018-10-22: 100 mL via INTRAVENOUS

## 2018-10-22 MED ORDER — ONDANSETRON 4 MG PO TBDP: 4.0000 mg | ORAL_TABLET | Freq: Four times a day (QID) | ORAL | Status: DC | PRN

## 2018-10-22 MED ORDER — DEXTROSE-NACL 5-0.45 % IV SOLN
INTRAVENOUS | Status: DC
  Administered 2018-10-22 – 2018-10-26 (×8): via INTRAVENOUS
  Administered 2018-10-27: 100 mL via INTRAVENOUS
  Administered 2018-10-27 – 2018-11-05 (×9): via INTRAVENOUS

## 2018-10-22 MED ORDER — DOCUSATE SODIUM 100 MG PO CAPS
100.0000 mg | ORAL_CAPSULE | Freq: Two times a day (BID) | ORAL | Status: DC
  Administered 2018-10-23: 100 mg via ORAL
  Filled 2018-10-22: qty 1

## 2018-10-22 MED ORDER — PROPOFOL 500 MG/50ML IV EMUL
INTRAVENOUS | Status: DC | PRN
  Administered 2018-10-22: 50 ug/kg/min via INTRAVENOUS

## 2018-10-22 MED ORDER — LACTATED RINGERS IV SOLN
INTRAVENOUS | Status: DC | PRN
  Administered 2018-10-22: 20:00:00 via INTRAVENOUS

## 2018-10-22 MED ORDER — ROCURONIUM BROMIDE 10 MG/ML (PF) SYRINGE
PREFILLED_SYRINGE | INTRAVENOUS | Status: DC | PRN
  Administered 2018-10-22 (×2): 50 mg via INTRAVENOUS

## 2018-10-22 MED ORDER — ETOMIDATE 2 MG/ML IV SOLN
INTRAVENOUS | Status: AC | PRN
  Administered 2018-10-22: 20 mg via INTRAVENOUS

## 2018-10-22 MED ORDER — PAPAVERINE HCL 30 MG/ML IJ SOLN
INTRAMUSCULAR | Status: AC
  Filled 2018-10-22: qty 2

## 2018-10-22 MED ORDER — FENTANYL BOLUS VIA INFUSION
50.0000 ug | INTRAVENOUS | Status: DC | PRN
  Filled 2018-10-22: qty 50

## 2018-10-22 MED ORDER — OXYCODONE-ACETAMINOPHEN 5-325 MG PO TABS
1.0000 | ORAL_TABLET | ORAL | Status: DC | PRN
  Administered 2018-11-04: 1 via ORAL
  Administered 2018-11-05: 2 via ORAL
  Filled 2018-10-22 (×2): qty 2

## 2018-10-22 MED ORDER — IOPAMIDOL (ISOVUE-370) INJECTION 76%
100.0000 mL | Freq: Once | INTRAVENOUS | Status: AC | PRN
  Administered 2018-10-22: 100 mL via INTRAVENOUS

## 2018-10-22 MED ORDER — SODIUM BICARBONATE 8.4 % IV SOLN
INTRAVENOUS | Status: DC | PRN
  Administered 2018-10-22: 50 meq via INTRAVENOUS

## 2018-10-22 MED ORDER — PROTAMINE SULFATE 10 MG/ML IV SOLN
INTRAVENOUS | Status: AC
  Filled 2018-10-22: qty 5

## 2018-10-22 MED ORDER — SODIUM BICARBONATE 8.4 % IV SOLN
INTRAVENOUS | Status: AC
  Filled 2018-10-22: qty 50

## 2018-10-22 MED ORDER — SODIUM CHLORIDE 0.9 % IV SOLN
INTRAVENOUS | Status: AC
  Filled 2018-10-22: qty 1.2

## 2018-10-22 MED ORDER — CHLORHEXIDINE GLUCONATE 0.12% ORAL RINSE (MEDLINE KIT)
15.0000 mL | Freq: Two times a day (BID) | OROMUCOSAL | Status: DC
  Administered 2018-10-22 – 2018-11-03 (×24): 15 mL via OROMUCOSAL

## 2018-10-22 MED ORDER — PAPAVERINE HCL 30 MG/ML IJ SOLN
INTRAMUSCULAR | Status: DC | PRN
  Administered 2018-10-22: 60 mg via INTRAVENOUS

## 2018-10-22 MED ORDER — ROCURONIUM BROMIDE 50 MG/5ML IV SOSY
PREFILLED_SYRINGE | INTRAVENOUS | Status: AC
  Filled 2018-10-22: qty 5

## 2018-10-22 MED ORDER — MIDAZOLAM HCL 2 MG/2ML IJ SOLN
INTRAMUSCULAR | Status: AC
  Filled 2018-10-22: qty 6

## 2018-10-22 MED ORDER — ONDANSETRON HCL 4 MG/2ML IJ SOLN
4.0000 mg | Freq: Four times a day (QID) | INTRAMUSCULAR | Status: DC | PRN
  Administered 2018-10-28: 4 mg via INTRAVENOUS
  Filled 2018-10-22: qty 2

## 2018-10-22 MED ORDER — MORPHINE SULFATE (PF) 2 MG/ML IV SOLN: 2.0000 mg | INTRAVENOUS | Status: DC | PRN

## 2018-10-22 MED ORDER — HEPARIN SODIUM (PORCINE) 5000 UNIT/ML IJ SOLN
5000.0000 [IU] | Freq: Three times a day (TID) | INTRAMUSCULAR | Status: DC
  Administered 2018-10-23 – 2018-10-24 (×3): 5000 [IU] via SUBCUTANEOUS
  Filled 2018-10-22 (×4): qty 1

## 2018-10-22 MED ORDER — HEPARIN SODIUM (PORCINE) 1000 UNIT/ML IJ SOLN
INTRAMUSCULAR | Status: DC | PRN
  Administered 2018-10-22: 7000 [IU] via INTRAVENOUS

## 2018-10-22 MED ORDER — FENTANYL CITRATE (PF) 100 MCG/2ML IJ SOLN
INTRAMUSCULAR | Status: AC | PRN
  Administered 2018-10-22 (×2): 50 ug via INTRAVENOUS

## 2018-10-22 MED ORDER — TETANUS-DIPHTH-ACELL PERTUSSIS 5-2.5-18.5 LF-MCG/0.5 IM SUSP
INTRAMUSCULAR | Status: AC
  Administered 2018-10-22: 0.5 mL via INTRAMUSCULAR
  Filled 2018-10-22: qty 0.5

## 2018-10-22 MED ORDER — BACITRACIN ZINC 500 UNIT/GM EX OINT
TOPICAL_OINTMENT | CUTANEOUS | Status: AC
  Filled 2018-10-22: qty 28.35

## 2018-10-22 MED ORDER — SODIUM CHLORIDE 0.9 % IV SOLN
INTRAVENOUS | Status: DC | PRN
  Administered 2018-10-22: 500 mL

## 2018-10-22 MED ORDER — ROCURONIUM BROMIDE 50 MG/5ML IV SOLN
INTRAVENOUS | Status: AC | PRN
  Administered 2018-10-22: 100 mg via INTRAVENOUS

## 2018-10-22 MED ORDER — PANTOPRAZOLE SODIUM 40 MG PO TBEC
40.0000 mg | DELAYED_RELEASE_TABLET | Freq: Every day | ORAL | Status: DC
  Filled 2018-10-22: qty 1

## 2018-10-22 MED ORDER — LEVETIRACETAM IN NACL 1000 MG/100ML IV SOLN
1000.0000 mg | Freq: Two times a day (BID) | INTRAVENOUS | Status: AC
  Administered 2018-10-23 (×2): 1000 mg via INTRAVENOUS
  Filled 2018-10-22 (×2): qty 100

## 2018-10-22 MED ORDER — ACETAMINOPHEN 325 MG PO TABS: 650.0000 mg | ORAL_TABLET | ORAL | Status: DC | PRN

## 2018-10-22 MED ORDER — MIDAZOLAM HCL 2 MG/2ML IJ SOLN
INTRAMUSCULAR | Status: AC
  Filled 2018-10-22: qty 2

## 2018-10-22 MED ORDER — ONDANSETRON HCL 4 MG/2ML IJ SOLN: 4.0000 mg | Freq: Four times a day (QID) | INTRAMUSCULAR | Status: DC | PRN

## 2018-10-22 MED ORDER — ALBUTEROL SULFATE HFA 108 (90 BASE) MCG/ACT IN AERS
INHALATION_SPRAY | RESPIRATORY_TRACT | Status: DC | PRN
  Administered 2018-10-22: 8 via RESPIRATORY_TRACT

## 2018-10-22 MED ORDER — SODIUM CHLORIDE 0.9 % IV SOLN: INTRAVENOUS | Status: DC

## 2018-10-22 MED ORDER — MIDAZOLAM HCL 5 MG/5ML IJ SOLN
INTRAMUSCULAR | Status: AC | PRN
  Administered 2018-10-22: 5 mg via INTRAVENOUS

## 2018-10-22 MED ORDER — CEFAZOLIN SODIUM-DEXTROSE 2-4 GM/100ML-% IV SOLN
2.0000 g | Freq: Three times a day (TID) | INTRAVENOUS | Status: AC
  Administered 2018-10-23 (×2): 2 g via INTRAVENOUS
  Filled 2018-10-22 (×2): qty 100

## 2018-10-22 MED ORDER — ALBUMIN HUMAN 5 % IV SOLN
INTRAVENOUS | Status: DC | PRN
  Administered 2018-10-22 (×4): via INTRAVENOUS

## 2018-10-22 MED ORDER — DOCUSATE SODIUM 50 MG/5ML PO LIQD
100.0000 mg | Freq: Two times a day (BID) | ORAL | Status: DC | PRN
  Administered 2018-10-24: 100 mg
  Filled 2018-10-22: qty 10

## 2018-10-22 MED ORDER — 0.9 % SODIUM CHLORIDE (POUR BTL) OPTIME
TOPICAL | Status: DC | PRN
  Administered 2018-10-22: 1000 mL

## 2018-10-22 MED ORDER — FENTANYL 2500MCG IN NS 250ML (10MCG/ML) PREMIX INFUSION
25.0000 ug/h | INTRAVENOUS | Status: DC
  Administered 2018-10-22: 25 ug/h via INTRAVENOUS
  Administered 2018-10-23 – 2018-10-25 (×5): 150 ug/h via INTRAVENOUS
  Administered 2018-10-26: 175 ug/h via INTRAVENOUS
  Administered 2018-10-27 (×2): 400 ug/h via INTRAVENOUS
  Administered 2018-10-27: 200 ug/h via INTRAVENOUS
  Administered 2018-10-27: 400 ug/h via INTRAVENOUS
  Administered 2018-10-28: 300 ug/h via INTRAVENOUS
  Administered 2018-10-28 – 2018-10-29 (×3): 400 ug/h via INTRAVENOUS
  Administered 2018-10-29 – 2018-10-30 (×4): 300 ug/h via INTRAVENOUS
  Administered 2018-10-30: 350 ug/h via INTRAVENOUS
  Administered 2018-10-31 (×2): 400 ug/h via INTRAVENOUS
  Administered 2018-10-31: 300 ug/h via INTRAVENOUS
  Administered 2018-10-31 – 2018-11-01 (×2): 400 ug/h via INTRAVENOUS
  Administered 2018-11-02: 250 ug/h via INTRAVENOUS
  Administered 2018-11-02: 300 ug/h via INTRAVENOUS
  Filled 2018-10-22 (×28): qty 250

## 2018-10-22 MED ORDER — HEPARIN SODIUM (PORCINE) 5000 UNIT/ML IJ SOLN: 5000.0000 [IU] | Freq: Three times a day (TID) | INTRAMUSCULAR | Status: DC

## 2018-10-22 MED ORDER — BACITRACIN ZINC 500 UNIT/GM EX OINT
TOPICAL_OINTMENT | CUTANEOUS | Status: DC | PRN
  Administered 2018-10-22: 1 via TOPICAL

## 2018-10-22 MED ORDER — ACETAMINOPHEN 650 MG RE SUPP
325.0000 mg | RECTAL | Status: DC | PRN
  Administered 2018-10-28 – 2018-10-29 (×2): 650 mg via RECTAL
  Filled 2018-10-22 (×2): qty 1

## 2018-10-22 MED ORDER — FENTANYL CITRATE (PF) 250 MCG/5ML IJ SOLN
INTRAMUSCULAR | Status: DC | PRN
  Administered 2018-10-22: 50 ug via INTRAVENOUS
  Administered 2018-10-22 (×2): 100 ug via INTRAVENOUS

## 2018-10-22 MED ORDER — TETANUS-DIPHTH-ACELL PERTUSSIS 5-2.5-18.5 LF-MCG/0.5 IM SUSP
0.5000 mL | Freq: Once | INTRAMUSCULAR | Status: AC
  Administered 2018-10-22: 0.5 mL via INTRAMUSCULAR

## 2018-10-22 MED ORDER — SODIUM CHLORIDE 0.9 % IV SOLN
INTRAVENOUS | Status: DC | PRN
  Administered 2018-10-22: 50 ug/min via INTRAVENOUS

## 2018-10-22 SURGICAL SUPPLY — 59 items
BAG DECANTER FOR FLEXI CONT (MISCELLANEOUS) ×3 IMPLANT
BANDAGE ELASTIC 4 VELCRO ST LF (GAUZE/BANDAGES/DRESSINGS) ×3 IMPLANT
BLADE 11 SAFETY STRL DISP (BLADE) ×3 IMPLANT
BLADE CLIPPER SURG (BLADE) ×3 IMPLANT
BNDG GAUZE ELAST 4 BULKY (GAUZE/BANDAGES/DRESSINGS) ×3 IMPLANT
CANISTER SUCT 3000ML PPV (MISCELLANEOUS) ×3 IMPLANT
CANNULA VESSEL 3MM 2 BLNT TIP (CANNULA) ×9 IMPLANT
CLIP VESOCCLUDE MED 24/CT (CLIP) ×3 IMPLANT
CLIP VESOCCLUDE SM WIDE 24/CT (CLIP) ×3 IMPLANT
COVER SURGICAL LIGHT HANDLE (MISCELLANEOUS) ×3 IMPLANT
COVER WAND RF STERILE (DRAPES) ×3 IMPLANT
DRAPE INCISE IOBAN 66X45 STRL (DRAPES) ×3 IMPLANT
DRAPE LAPAROSCOPIC ABDOMINAL (DRAPES) IMPLANT
DRAPE LAPAROTOMY 100X72 PEDS (DRAPES) ×3 IMPLANT
DRSG COVADERM 4X8 (GAUZE/BANDAGES/DRESSINGS) ×3 IMPLANT
DRSG PAD ABDOMINAL 8X10 ST (GAUZE/BANDAGES/DRESSINGS) IMPLANT
ELECT CAUTERY BLADE 6.4 (BLADE) ×3 IMPLANT
ELECT REM PT RETURN 9FT ADLT (ELECTROSURGICAL) ×3
ELECTRODE REM PT RTRN 9FT ADLT (ELECTROSURGICAL) ×1 IMPLANT
GAUZE SPONGE 4X4 12PLY STRL (GAUZE/BANDAGES/DRESSINGS) IMPLANT
GAUZE SPONGE 4X4 12PLY STRL LF (GAUZE/BANDAGES/DRESSINGS) ×3 IMPLANT
GEL ULTRASOUND 8.5O AQUASONIC (MISCELLANEOUS) ×3 IMPLANT
GLOVE BIO SURGEON STRL SZ 6.5 (GLOVE) ×2 IMPLANT
GLOVE BIO SURGEON STRL SZ7 (GLOVE) ×3 IMPLANT
GLOVE BIO SURGEONS STRL SZ 6.5 (GLOVE) ×1
GLOVE BIOGEL PI IND STRL 7.5 (GLOVE) ×4 IMPLANT
GLOVE BIOGEL PI INDICATOR 7.5 (GLOVE) ×8
GLOVE INDICATOR 7.5 STRL GRN (GLOVE) ×3 IMPLANT
GLOVE SURG SS PI 7.0 STRL IVOR (GLOVE) ×9 IMPLANT
GOWN STRL REUS W/ TWL LRG LVL3 (GOWN DISPOSABLE) ×4 IMPLANT
GOWN STRL REUS W/TWL LRG LVL3 (GOWN DISPOSABLE) ×8
KIT BASIN OR (CUSTOM PROCEDURE TRAY) ×3 IMPLANT
KIT TURNOVER KIT B (KITS) ×3 IMPLANT
NEEDLE 22X1 1/2 (OR ONLY) (NEEDLE) ×3 IMPLANT
NS IRRIG 1000ML POUR BTL (IV SOLUTION) ×3 IMPLANT
PACK SURGICAL SETUP 50X90 (CUSTOM PROCEDURE TRAY) ×3 IMPLANT
PAD ARMBOARD 7.5X6 YLW CONV (MISCELLANEOUS) ×6 IMPLANT
PENCIL BUTTON HOLSTER BLD 10FT (ELECTRODE) ×3 IMPLANT
SPONGE LAP 18X18 X RAY DECT (DISPOSABLE) ×3 IMPLANT
STAPLER VISISTAT 35W (STAPLE) ×6 IMPLANT
SUT ETHILON 3 0 PS 1 (SUTURE) ×6 IMPLANT
SUT PROLENE 6 0 BV (SUTURE) ×15 IMPLANT
SUT SILK 3 0 (SUTURE) ×2
SUT SILK 3-0 18XBRD TIE 12 (SUTURE) ×1 IMPLANT
SUT VIC AB 3-0 SH 27 (SUTURE) ×8
SUT VIC AB 3-0 SH 27X BRD (SUTURE) ×4 IMPLANT
SUT VIC AB 4-0 PS2 18 (SUTURE) ×3 IMPLANT
SUT VIC AB 4-0 PS2 27 (SUTURE) ×3 IMPLANT
SWAB COLLECTION DEVICE MRSA (MISCELLANEOUS) IMPLANT
SWAB CULTURE ESWAB REG 1ML (MISCELLANEOUS) IMPLANT
SYR 20CC LL (SYRINGE) ×9 IMPLANT
SYR 5ML LL (SYRINGE) ×3 IMPLANT
SYR TOOMEY 50ML (SYRINGE) ×3 IMPLANT
TOWEL OR 17X24 6PK STRL BLUE (TOWEL DISPOSABLE) ×6 IMPLANT
TOWEL OR 17X26 10 PK STRL BLUE (TOWEL DISPOSABLE) ×3 IMPLANT
TRAY FOLEY MTR SLVR 16FR STAT (SET/KITS/TRAYS/PACK) ×3 IMPLANT
TUBE CONNECTING 12'X1/4 (SUCTIONS) ×1
TUBE CONNECTING 12X1/4 (SUCTIONS) ×2 IMPLANT
YANKAUER SUCT BULB TIP NO VENT (SUCTIONS) ×3 IMPLANT

## 2018-10-22 NOTE — Anesthesia Preprocedure Evaluation (Addendum)
Anesthesia Evaluation  Patient identified by MRN, date of birth, ID band Patient unresponsive    Reviewed: Unable to perform ROS - Chart review onlyPreop documentation limited or incomplete due to emergent nature of procedure.  Airway Mallampati: Intubated       Dental   Pulmonary asthma , Current Smoker,   Decreased LLQ, b/l wheezing    + decreased breath sounds+ wheezing      Cardiovascular  Rhythm:Regular Rate:Tachycardia     Neuro/Psych Seizures -,     GI/Hepatic   Endo/Other    Renal/GU      Musculoskeletal   Abdominal   Peds  Hematology   Anesthesia Other Findings   Reproductive/Obstetrics                           Anesthesia Physical Anesthesia Plan  ASA: IV and emergent  Anesthesia Plan: General   Post-op Pain Management:    Induction: Inhalational  PONV Risk Score and Plan: 1 and Treatment may vary due to age or medical condition  Airway Management Planned: Oral ETT  Additional Equipment: Arterial line  Intra-op Plan:   Post-operative Plan: Post-operative intubation/ventilation  Informed Consent:   Only emergency history available and History available from chart only  Plan Discussed with:   Anesthesia Plan Comments:        Anesthesia Quick Evaluation
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
## 2018-10-22 NOTE — Progress Notes (Signed)
Orthopedic Tech Progress Note Patient Details:  [name] Hall 10/11/1986 [id number] 1st and 2nd toe Ortho Devices Type of Ortho Device: Buddy tape Ortho Device/Splint Interventions: Ordered, Application   Post Interventions Patient Tolerated: Well Instructions Provided: Care of device   [name] 10/22/2018, 11:42 PM
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
## 2018-10-22 NOTE — ED Notes (Signed)
Attempted x 5 for OG unsucessful

## 2018-10-22 NOTE — Progress Notes (Signed)
Patient transported from OR to 4N20 with no complications.

## 2018-10-22 NOTE — Progress Notes (Signed)
Spoke with Dr. Otelia LimesLindzen of neurology about post-traumatic seizure -recommendation for keppra and then MRI when stable
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
## 2018-10-22 NOTE — Progress Notes (Signed)
I have spoken with Dr. Sheliah HatchKinsinger Regarding this patient's Right foot orthopedic injuries.  Based on the lack of fracture through the hallux I do believe that status post the reduction this should be a stable injury and they could internally buddy tape to the second toe if needed.  Otherwise the metatarsal neck fractures of the second and third will be appropriate managed conservatively.  He can be placed in a postop shoe and weight-bear as tolerated.  We will plan to leave a formal consult note in the morning.  I would like repeat x-rays of the right foot when able, once the patient is stable enough to have 3 views of the right foot.
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
## 2018-10-22 NOTE — Transfer of Care (Signed)
Immediate Anesthesia Transfer of Care Note  Patient: [name] AreaJoshua W Wolff  Procedure(s) Performed: REPAIR OF RIGHT BRACHIAL ARTERY WITH INTEPOSITONAL VEIN GRAFT USING LEFT GREATER SAPEHNOUS (Right )  Patient Location: ICU  Anesthesia Type:General  Level of Consciousness: Patient remains intubated per anesthesia plan  Airway & Oxygen Therapy: Patient placed on Ventilator (see vital sign flow sheet for setting)  Post-op Assessment: Report given to RN and Post -op Vital signs reviewed and stable  Post vital signs: Reviewed and stable  Last Vitals:  Vitals Value Taken Time  BP 143/68 10/22/2018 10:42 PM  Temp    Pulse 111 10/22/2018 10:48 PM  Resp 18 10/22/2018 10:48 PM  SpO2 97 % 10/22/2018 10:48 PM  Vitals shown include unvalidated device data.  Last Pain:  Vitals:   10/22/18 1751  TempSrc:   PainSc: 10-Worst pain ever         Complications: No apparent anesthesia complications

## 2018-10-22 NOTE — Anesthesia Procedure Notes (Signed)
Arterial Line Insertion Start/End12/30/2019 7:40 PM, 10/22/2018 7:50 PM Performed by: Molli HazardGordon, Donice Alperin M, CRNA, CRNA  Left, radial was placed Catheter size: 20 G Hand hygiene performed  and maximum sterile barriers used   Attempts: 1 Procedure performed without using ultrasound guided technique. Following insertion, dressing applied and Biopatch. Post procedure assessment: normal  Patient tolerated the procedure well with no immediate complications.

## 2018-10-23 ENCOUNTER — Inpatient Hospital Stay (HOSPITAL_COMMUNITY): Payer: BLUE CROSS/BLUE SHIELD

## 2018-10-23 ENCOUNTER — Encounter (HOSPITAL_COMMUNITY): Payer: Self-pay | Admitting: Vascular Surgery

## 2018-10-23 DIAGNOSIS — R569 Unspecified convulsions: Secondary | ICD-10-CM

## 2018-10-23 LAB — CBC
HCT: 37.2 % — ABNORMAL LOW (ref 39.0–52.0)
HCT: 36.8 % — ABNORMAL LOW (ref 39.0–52.0)
Hemoglobin: 12.3 g/dL — ABNORMAL LOW (ref 13.0–17.0)
Hemoglobin: 12.1 g/dL — ABNORMAL LOW (ref 13.0–17.0)
MCH: 28.5 pg (ref 26.0–34.0)
MCH: 28.1 pg (ref 26.0–34.0)
MCHC: 33.1 g/dL (ref 30.0–36.0)
MCHC: 32.9 g/dL (ref 30.0–36.0)
MCV: 86.3 fL (ref 80.0–100.0)
MCV: 85.6 fL (ref 80.0–100.0)
Platelets: 207 10*3/uL (ref 150–400)
Platelets: 202 10*3/uL (ref 150–400)
RBC: 4.31 MIL/uL (ref 4.22–5.81)
RBC: 4.3 MIL/uL (ref 4.22–5.81)
RDW: 13 % (ref 11.5–15.5)
RDW: 13.3 % (ref 11.5–15.5)
WBC: 13.5 10*3/uL — ABNORMAL HIGH (ref 4.0–10.5)
WBC: 9.2 10*3/uL (ref 4.0–10.5)
nRBC: 0 % (ref 0.0–0.2)
nRBC: 0 % (ref 0.0–0.2)

## 2018-10-23 LAB — BPAM RBC
Blood Product Expiration Date: 202001182359
Blood Product Expiration Date: 202001182359
Blood Product Expiration Date: 202001252359
Blood Product Expiration Date: 202001282359
ISSUE DATE / TIME: 201912301741
ISSUE DATE / TIME: 201912301741
ISSUE DATE / TIME: 201912302037
ISSUE DATE / TIME: 201912302037
Unit Type and Rh: 5100
Unit Type and Rh: 5100
Unit Type and Rh: 9500
Unit Type and Rh: 9500

## 2018-10-23 LAB — PREPARE FRESH FROZEN PLASMA
UNIT DIVISION: 0
UNIT DIVISION: 0
Unit division: 0
Unit division: 0

## 2018-10-23 LAB — TYPE AND SCREEN
ABO/RH(D): O POS
Antibody Screen: NEGATIVE
Unit division: 0
Unit division: 0
Unit division: 0
Unit division: 0

## 2018-10-23 LAB — BPAM FFP
Blood Product Expiration Date: 202001022359
Blood Product Expiration Date: 202001022359
Blood Product Expiration Date: 202001032359
Blood Product Expiration Date: 202001032359
ISSUE DATE / TIME: 201912301741
ISSUE DATE / TIME: 201912301741
ISSUE DATE / TIME: 201912302039
ISSUE DATE / TIME: 201912302039
UNIT TYPE AND RH: 5100
Unit Type and Rh: 5100
Unit Type and Rh: 6200
Unit Type and Rh: 6200

## 2018-10-23 LAB — BASIC METABOLIC PANEL
Anion gap: 10 (ref 5–15)
BUN: 7 mg/dL (ref 6–20)
CO2: 20 mmol/L — ABNORMAL LOW (ref 22–32)
Calcium: 7.4 mg/dL — ABNORMAL LOW (ref 8.9–10.3)
Chloride: 111 mmol/L (ref 98–111)
Creatinine, Ser: 1.13 mg/dL (ref 0.61–1.24)
GFR calc Af Amer: >60 mL/min (ref >60)
GFR calc non Af Amer: >60 mL/min (ref >60)
Glucose, Bld: 120 mg/dL — ABNORMAL HIGH (ref 70–99)
Potassium: 2.9 mmol/L — ABNORMAL LOW (ref 3.5–5.1)
SODIUM: 141 mmol/L (ref 135–145)

## 2018-10-23 LAB — URINALYSIS, ROUTINE W REFLEX MICROSCOPIC
Bacteria, UA: NONE SEEN
Bilirubin Urine: NEGATIVE
Glucose, UA: NEGATIVE mg/dL
Ketones, ur: NEGATIVE mg/dL
Leukocytes, UA: NEGATIVE
Nitrite: NEGATIVE
Protein, ur: NEGATIVE mg/dL
Specific Gravity, Urine: 1.024 (ref 1.005–1.030)
pH: 5 (ref 5.0–8.0)

## 2018-10-23 LAB — LACTIC ACID, PLASMA: Lactic Acid, Venous: 1.1 mmol/L (ref 0.5–1.9)

## 2018-10-23 LAB — CREATININE, SERUM
Creatinine, Ser: 1.07 mg/dL (ref 0.61–1.24)
GFR calc Af Amer: >60 mL/min (ref >60)
GFR calc non Af Amer: >60 mL/min (ref >60)

## 2018-10-23 LAB — GLUCOSE, CAPILLARY
Glucose-Capillary: 142 mg/dL — ABNORMAL HIGH (ref 70–99)
Glucose-Capillary: 108 mg/dL — ABNORMAL HIGH (ref 70–99)
Glucose-Capillary: 123 mg/dL — ABNORMAL HIGH (ref 70–99)
Glucose-Capillary: 76 mg/dL (ref 70–99)

## 2018-10-23 LAB — POCT I-STAT 7, (LYTES, BLD GAS, ICA,H+H)
Acid-base deficit: 8 mmol/L — ABNORMAL HIGH (ref 0.0–2.0)
BICARBONATE: 24.3 mmol/L (ref 20.0–28.0)
Calcium, Ion: 0.91 mmol/L — ABNORMAL LOW (ref 1.15–1.40)
HCT: 34 % — ABNORMAL LOW (ref 39.0–52.0)
Hemoglobin: 11.6 g/dL — ABNORMAL LOW (ref 13.0–17.0)
O2 Saturation: 96 %
Patient temperature: 36.1
Potassium: 3.9 mmol/L (ref 3.5–5.1)
Sodium: 146 mmol/L — ABNORMAL HIGH (ref 135–145)
TCO2: 27 mmol/L (ref 22–32)
pCO2 arterial: 87.6 mmHg (ref 32.0–48.0)
pH, Arterial: 7.045 — CL (ref 7.350–7.450)
pO2, Arterial: 122 mmHg — ABNORMAL HIGH (ref 83.0–108.0)

## 2018-10-23 LAB — POCT I-STAT 3, ART BLOOD GAS (G3+)
Acid-base deficit: 3 mmol/L — ABNORMAL HIGH (ref 0.0–2.0)
Bicarbonate: 21.9 mmol/L (ref 20.0–28.0)
O2 Saturation: 100 %
PCO2 ART: 38 mmHg (ref 32.0–48.0)
Patient temperature: 98.6
TCO2: 23 mmol/L (ref 22–32)
pH, Arterial: 7.37 (ref 7.350–7.450)
pO2, Arterial: 175 mmHg — ABNORMAL HIGH (ref 83.0–108.0)

## 2018-10-23 LAB — BLOOD PRODUCT ORDER (VERBAL) VERIFICATION

## 2018-10-23 LAB — TRIGLYCERIDES: Triglycerides: 246 mg/dL — ABNORMAL HIGH (ref <150)

## 2018-10-23 LAB — HIV ANTIBODY (ROUTINE TESTING W REFLEX): HIV Screen 4th Generation wRfx: NONREACTIVE

## 2018-10-23 MED ORDER — SODIUM CHLORIDE 0.9 % IV BOLUS
1000.0000 mL | Freq: Once | INTRAVENOUS | Status: AC
  Administered 2018-10-23: 1000 mL via INTRAVENOUS

## 2018-10-23 MED ORDER — MIDAZOLAM HCL 2 MG/2ML IJ SOLN
INTRAMUSCULAR | Status: AC
  Administered 2018-10-23: 2 mg via INTRAVENOUS
  Filled 2018-10-23: qty 2

## 2018-10-23 MED ORDER — MIDAZOLAM HCL 2 MG/2ML IJ SOLN
2.0000 mg | INTRAMUSCULAR | Status: DC | PRN
  Administered 2018-10-23 – 2018-11-01 (×29): 2 mg via INTRAVENOUS
  Filled 2018-10-23 (×31): qty 2

## 2018-10-23 MED ORDER — SODIUM CHLORIDE 0.9 % IV BOLUS
1000.0000 mL | INTRAVENOUS | Status: DC | PRN
  Administered 2018-10-23: 500 mL via INTRAVENOUS
  Administered 2018-10-24: 1000 mL via INTRAVENOUS
  Administered 2018-10-24: 500 mL via INTRAVENOUS

## 2018-10-23 MED ORDER — POTASSIUM CHLORIDE 10 MEQ/100ML IV SOLN
10.0000 meq | INTRAVENOUS | Status: AC
  Administered 2018-10-23 (×3): 10 meq via INTRAVENOUS
  Filled 2018-10-23 (×3): qty 100

## 2018-10-23 MED ORDER — BACITRACIN ZINC 500 UNIT/GM EX OINT
TOPICAL_OINTMENT | Freq: Every day | CUTANEOUS | Status: DC
  Administered 2018-10-23: 19:00:00 via TOPICAL
  Administered 2018-10-24: 31.5556 via TOPICAL
  Administered 2018-10-25: 10:00:00 via TOPICAL
  Administered 2018-10-26 – 2018-10-28 (×3): 31.5556 via TOPICAL
  Administered 2018-10-30: 1 via TOPICAL
  Administered 2018-10-31: 31.5556 via TOPICAL
  Administered 2018-11-01: 1 via TOPICAL
  Administered 2018-11-02: 31.5556 via TOPICAL
  Administered 2018-11-03: 1 via TOPICAL
  Administered 2018-11-04: 31.5556 via TOPICAL
  Administered 2018-11-05 – 2018-11-06 (×2): 1 via TOPICAL
  Filled 2018-10-23: qty 28.4

## 2018-10-23 MED ORDER — LEVETIRACETAM IN NACL 500 MG/100ML IV SOLN: 500.0000 mg | Freq: Two times a day (BID) | INTRAVENOUS | Status: DC

## 2018-10-23 MED ORDER — ALBUTEROL SULFATE (2.5 MG/3ML) 0.083% IN NEBU
2.5000 mg | INHALATION_SOLUTION | RESPIRATORY_TRACT | Status: DC | PRN
  Administered 2018-10-23 – 2018-10-24 (×2): 2.5 mg via RESPIRATORY_TRACT
  Filled 2018-10-23 (×2): qty 3

## 2018-10-23 NOTE — Progress Notes (Signed)
EEG complete - results pending 
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
## 2018-10-23 NOTE — Procedures (Signed)
ELECTROENCEPHALOGRAM REPORT   Patient: [name] AreaJoshua W Christley       Room #: 1O10R4N20C EEG No. ID: [id number] Age: 32 y.o.        Sex: male Referring Physician: Cornett Report Date:  10/23/2018        Interpreting Physician: Thana FarrEYNOLDS, Mandisa Persinger  History: [name] Hall is an 32 y.o. 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 One channel was dedicated to EKG recording.  The patient is in the intubated and sedated state.  Description:  The background activity is slow and poorly organized.  It consists of a low voltage polymorphic delta activity that is diffusely distributed and continuous throughout the recording.  There is a fairly well organized, overlying beta activity that is noted as well.   No epileptiform activity is noted.  Hyperventilation and intermittent photic stimulation were not performed.  IMPRESSION: This is an abnormal EEG secondary to general background slowing.  This finding may be seen with a diffuse disturbance that is etiologically nonspecific, but may include a metabolic encephalopathy or medication effect, among other possibilities.  No epileptiform activity was noted.     Thana FarrLeslie Aikam Hellickson, MD Neurology [phone] 10/23/2018, 1:08 PM

## 2018-10-23 NOTE — Progress Notes (Signed)
Subjective: He was agitated this morning, received boluses of sedation just prior to my exam, per nursing he was moving all 4 extremities purposefully and trying to speak around the tube.  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  Early seizures following traumatic brain injury are not necessarily a good predictor for lifelong epilepsy, and therefore he does not necessarily need long-term antiepileptics, but I would favor using them in the short-term.  He is currently on Keppra 1 g twice daily.  Recommendations: 1) continue Keppra 1 g twice daily for 7 days 2) MRI when feasible 3) EEG 4) neurology will follow  Ritta SlotMcNeill Staisha Winiarski, MD [facility] [phone]  If 7pm- 7am, please page neurology on call as listed in AMION.
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
## 2018-10-23 NOTE — Progress Notes (Signed)
Patient continues to have high peak pressures in the 40s on ventilator. RT bag lavaged patient and suctioned out some bloody mucous plugs. Peak pressures are now in low 30s. Patient tolerated well. RN is aware. RT will continue to monitor patient.
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
## 2018-10-23 NOTE — Progress Notes (Signed)
OT Cancellation Note  Patient Details Name: Mauricia AreaJoshua W Aker MRN: [id number] DOB: 09/10/1986   Cancelled Treatment:    Reason Eval/Treat Not Completed: Patient not medically ready. Per PT pt in 5 point restraints, intubated and sedated.  OT to return as appropriate and able to initiate evaluation.    Chancy Milroyhristie S Tully Mcinturff, OT Acute Rehabilitation Services Pager [phone] Office [phone]   Chancy MilroyChristie S Reggie Welge 10/23/2018, 8:56 AM

## 2018-10-23 NOTE — Progress Notes (Signed)
   VASCULAR SURGERY ASSESSMENT & PLAN:   1 Day Post-Op s/p: Repair of right brachial artery with interposition saphenous vein graft  Palpable right radial pulse.  Sedated on vent so difficult to assess neurologic function of hand.  Begin dressing changes today.  SUBJECTIVE:   Sedated on vent.  PHYSICAL EXAM:   Vitals:   10/23/18 0427 10/23/18 0500 10/23/18 0600 10/23/18 0700  BP: (!) 104/57     Pulse: (!) 118 (!) 104 (!) 111 99  Resp: (!) 28 (!) 25 15 (!) 22  Temp:      TempSrc:      SpO2: 98% 99% 99% 99%  Weight:      Height:       Palpable right radial pulse.  LABS:   Lab Results  Component Value Date   WBC 9.2 10/23/2018   HGB 12.1 (L) 10/23/2018   HCT 36.8 (L) 10/23/2018   MCV 85.6 10/23/2018   PLT 202 10/23/2018   Lab Results  Component Value Date   CREATININE 1.13 10/23/2018   Lab Results  Component Value Date   INR 1.22 10/22/2018   CBG (last 3)  Recent Labs    10/22/18 2331  GLUCAP 125*    PROBLEM LIST:    Active Problems:   Seizure after head injury ([place])   Injury of brachial artery, right, initial encounter   CURRENT MEDS:   . chlorhexidine gluconate (MEDLINE KIT)  15 mL Mouth Rinse BID  . docusate sodium  100 mg Oral BID  . fentaNYL (SUBLIMAZE) injection  50 mcg Intravenous Once  . fentaNYL (SUBLIMAZE) injection  50 mcg Intravenous Once  . heparin  5,000 Units Subcutaneous Q8H  . insulin aspart  0-9 Units Subcutaneous TID WC  . mouth rinse  15 mL Mouth Rinse 10 times per day  . pantoprazole  40 mg Oral Daily    Deitra Mayo Beeper: [phone] Office: [phone] 10/23/2018

## 2018-10-23 NOTE — Progress Notes (Signed)
PT Cancellation Note  Patient Details Name: Mauricia AreaJoshua W Olivar MRN: [id number] DOB: 10/27/1985   Cancelled Treatment:    Reason Eval/Treat Not Completed: Patient not medically ready. Pt in 5 point restraints, intubated and very sedated due to severe agitation. Acute PT to return as able, as appropriate to complete PT eval.   Lewis ShockAshly Toshika Parrow, PT, DPT Acute Rehabilitation Services Pager #: [phone] Office #: [phone]    Iona Hansenshly M Aries Townley 10/23/2018, 8:40 AM
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
## 2018-10-23 NOTE — Progress Notes (Signed)
While being pulled up in the bed and ventilator assessment, the pt. Began to pour blood out of his mouth, was sitting up out of the bed and fighting relentlessly with the RT and I.   He had lots of thick blood coming from the ETT suction and became increasingly agitated. Maxed out on propofol and when increased fentanyl, BP dropped.   Trauma verbal order for Versed 2mg  @10minute  and spoke to [place] - will postpone MRI for now until more stable.   Will continue to monitor.   Sherrie GeorgeMegan Lolly Glaus, RN BSN

## 2018-10-24 ENCOUNTER — Inpatient Hospital Stay (HOSPITAL_COMMUNITY): Payer: BLUE CROSS/BLUE SHIELD

## 2018-10-24 LAB — CBC
HCT: 29.3 % — ABNORMAL LOW (ref 39.0–52.0)
Hemoglobin: 9.7 g/dL — ABNORMAL LOW (ref 13.0–17.0)
MCH: 29.6 pg (ref 26.0–34.0)
MCHC: 33.1 g/dL (ref 30.0–36.0)
MCV: 89.3 fL (ref 80.0–100.0)
Platelets: 143 10*3/uL — ABNORMAL LOW (ref 150–400)
RBC: 3.28 MIL/uL — ABNORMAL LOW (ref 4.22–5.81)
RDW: 14.1 % (ref 11.5–15.5)
WBC: 7.4 10*3/uL (ref 4.0–10.5)
nRBC: 0 % (ref 0.0–0.2)

## 2018-10-24 LAB — COMPREHENSIVE METABOLIC PANEL
ALT: 20 U/L (ref 0–44)
AST: 33 U/L (ref 15–41)
Albumin: 2.9 g/dL — ABNORMAL LOW (ref 3.5–5.0)
Alkaline Phosphatase: 37 U/L — ABNORMAL LOW (ref 38–126)
Anion gap: 5 (ref 5–15)
BILIRUBIN TOTAL: 0.5 mg/dL (ref 0.3–1.2)
BUN: <5 mg/dL — ABNORMAL LOW (ref 6–20)
CO2: 21 mmol/L — ABNORMAL LOW (ref 22–32)
Calcium: 7.9 mg/dL — ABNORMAL LOW (ref 8.9–10.3)
Chloride: 117 mmol/L — ABNORMAL HIGH (ref 98–111)
Creatinine, Ser: 0.87 mg/dL (ref 0.61–1.24)
GFR calc Af Amer: >60 mL/min (ref >60)
GFR calc non Af Amer: >60 mL/min (ref >60)
Glucose, Bld: 93 mg/dL (ref 70–99)
Potassium: 3.6 mmol/L (ref 3.5–5.1)
Sodium: 143 mmol/L (ref 135–145)
TOTAL PROTEIN: 4.6 g/dL — AB (ref 6.5–8.1)

## 2018-10-24 LAB — PHOSPHORUS: Phosphorus: 2.4 mg/dL — ABNORMAL LOW (ref 2.5–4.6)

## 2018-10-24 LAB — GLUCOSE, CAPILLARY
GLUCOSE-CAPILLARY: 73 mg/dL (ref 70–99)
GLUCOSE-CAPILLARY: 102 mg/dL — AB (ref 70–99)

## 2018-10-24 LAB — MAGNESIUM: Magnesium: 2 mg/dL (ref 1.7–2.4)

## 2018-10-24 MED ORDER — PRO-STAT SUGAR FREE PO LIQD
30.0000 mL | Freq: Two times a day (BID) | ORAL | Status: DC
  Administered 2018-10-24 – 2018-11-02 (×19): 30 mL
  Filled 2018-10-24 (×19): qty 30

## 2018-10-24 MED ORDER — DEXTROSE 50 % IV SOLN
INTRAVENOUS | Status: AC
  Administered 2018-10-24: 12.5 g via INTRAVENOUS
  Filled 2018-10-24: qty 50

## 2018-10-24 MED ORDER — DEXTROSE 50 % IV SOLN
12.5000 g | INTRAVENOUS | Status: AC
  Administered 2018-10-24: 12.5 g via INTRAVENOUS

## 2018-10-24 MED ORDER — VITAL HIGH PROTEIN PO LIQD
1000.0000 mL | ORAL | Status: DC
  Administered 2018-10-24: 1000 mL

## 2018-10-24 MED ORDER — DOCUSATE SODIUM 50 MG/5ML PO LIQD
100.0000 mg | Freq: Two times a day (BID) | ORAL | Status: DC
  Administered 2018-10-24 – 2018-11-04 (×19): 100 mg
  Filled 2018-10-24 (×19): qty 10

## 2018-10-24 MED ORDER — ENOXAPARIN SODIUM 40 MG/0.4ML ~~LOC~~ SOLN
40.0000 mg | SUBCUTANEOUS | Status: DC
  Administered 2018-10-24 – 2018-11-06 (×14): 40 mg via SUBCUTANEOUS
  Filled 2018-10-24 (×14): qty 0.4

## 2018-10-24 MED ORDER — PANTOPRAZOLE SODIUM 40 MG IV SOLR
40.0000 mg | Freq: Every day | INTRAVENOUS | Status: DC
  Administered 2018-10-24: 40 mg via INTRAVENOUS
  Filled 2018-10-24: qty 40

## 2018-10-24 NOTE — Progress Notes (Signed)
OT Cancellation Note  Patient Details Name: [name] Hall MRN: [id number] DOB: Dec 12, 1985   Cancelled Treatment:    Reason Eval/Treat Not Completed: Patient not medically ready. Pt remains on heavy sedation and is not following commands. Pt going to MRI . OT to return as able, as appropriate to complete OT eval.  Evern Bio Theresa Wedel 10/24/2018, 10:39 AM  Sherryl Manges OTR/L Acute Rehabilitation Services Pager: [phone] Office: [phone]
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
## 2018-10-24 NOTE — Progress Notes (Signed)
Neurology Progress Note   S:// Seen and examined.  Pending MRI.   O:// Current vital signs: BP (!) 118/49   Pulse 77   Temp 98.5 F (36.9 C) (Axillary)   Resp (!) 22   Ht 5' 7"  (1.702 m)   Wt 72.6 kg   SpO2 100%   BMI 25.06 kg/m  Vital signs in last 24 hours: Temp:  [97.4 F (36.3 C)-98.5 F (36.9 C)] 98.5 F (36.9 C) (01/01 0800) Pulse Rate:  [72-93] 77 (01/01 0700) Resp:  [11-24] 22 (01/01 0700) BP: (85-148)/(48-74) 118/49 (01/01 1141) SpO2:  [92 %-100 %] 100 % (01/01 0700) Arterial Line BP: (66-146)/(48-59) 115/48 (01/01 0700) FiO2 (%):  [40 %] 40 % (01/01 1141) General: Patient sedated on propofol and fentanyl.  Intubated. HEENT: Neck in c-collar, swelling around the orbits and bilateral face, crusted blood from nose Respiratory: Vented, scattered rales Cardiovascular: Regular rate rhythm Abdomen: Soft nondistended nontender Extremities: Warm well perfused with bruising on the right foot Neurological exam Sedated on propofol fentanyl, intubated. No spontaneous movement noted. Does not open eyes to voice or noxious stimulation. On holding propofol for few minutes, to noxious stimulation, moves all fours vigorously. Does not follow commands even after holding sedation   Medications  Current Facility-Administered Medications:  .  acetaminophen (TYLENOL) tablet 325-650 mg, 325-650 mg, Oral, Q4H PRN **OR** acetaminophen (TYLENOL) suppository 325-650 mg, 325-650 mg, Rectal, Q4H PRN, Cameron Proud, Matthew, PA-C .  albuterol (PROVENTIL) (2.5 MG/3ML) 0.083% nebulizer solution 2.5 mg, 2.5 mg, Nebulization, Q4H PRN, Nicholes Stairs, MD, 2.5 mg at 10/24/18 0826 .  bacitracin ointment, , Topical, Daily, Erroll Luna, MD, 09.4709 application at 62/83/66 0900 .  chlorhexidine gluconate (MEDLINE KIT) (PERIDEX) 0.12 % solution 15 mL, 15 mL, Mouth Rinse, BID, Kinsinger, Arta Bruce, MD, 15 mL at 10/24/18 0820 .  dextrose 5 %-0.45 % sodium chloride infusion, , Intravenous,  Continuous, Kinsinger, Arta Bruce, MD, Last Rate: 100 mL/hr at 10/24/18 1003 .  docusate (COLACE) 50 MG/5ML liquid 100 mg, 100 mg, Per Tube, BID PRN, Kinsinger, Arta Bruce, MD, 100 mg at 10/24/18 1124 .  docusate sodium (COLACE) capsule 100 mg, 100 mg, Oral, BID, Kinsinger, Arta Bruce, MD, 100 mg at 10/23/18 2147 .  enoxaparin (LOVENOX) injection 40 mg, 40 mg, Subcutaneous, Q24H, Kinsinger, Arta Bruce, MD, 40 mg at 10/24/18 1124 .  fentaNYL (SUBLIMAZE) bolus via infusion 50 mcg, 50 mcg, Intravenous, Q1H PRN, Kinsinger, Arta Bruce, MD .  fentaNYL (SUBLIMAZE) injection 50 mcg, 50 mcg, Intravenous, Once, Kinsinger, Arta Bruce, MD .  fentaNYL (SUBLIMAZE) injection 50 mcg, 50 mcg, Intravenous, Once, Kinsinger, Arta Bruce, MD .  fentaNYL 2527mg in NS 2556m(1052mml) infusion-PREMIX, 25-400 mcg/hr, Intravenous, Continuous, Kinsinger, LukArta BruceD, Last Rate: 12.5 mL/hr at 10/24/18 0800, 125 mcg/hr at 10/24/18 0800 .  insulin aspart (novoLOG) injection 0-9 Units, 0-9 Units, Subcutaneous, TID WC, Kinsinger, LukArta BruceD .  MEDLINE mouth rinse, 15 mL, Mouth Rinse, 10 times per day, Kinsinger, LukArta BruceD, 15 mL at 10/24/18 1015 .  midazolam (VERSED) injection 2 mg, 2 mg, Intravenous, Q10 min PRN, Kinsinger, LukArta BruceD, 2 mg at 10/24/18 0159 .  morphine 2 MG/ML injection 2-4 mg, 2-4 mg, Intravenous, Q1H PRN, Kinsinger, LukArta BruceD .  ondansetron (ZOFRAN-ODT) disintegrating tablet 4 mg, 4 mg, Oral, Q6H PRN **OR** ondansetron (ZOFRAN) injection 4 mg, 4 mg, Intravenous, Q6H PRN, Kinsinger, LukArta BruceD .  ondansetron (ZOFRAN) injection 4 mg, 4 mg, Intravenous, Q6H PRN, EveDagoberto LigasA-C .  oxyCODONE-acetaminophen (PERCOCET/ROXICET) 5-325 MG per tablet 1-2 tablet, 1-2 tablet, Oral, Q4H PRN, Eveland, Matthew, PA-C .  pantoprazole (PROTONIX) EC tablet 40 mg, 40 mg, Oral, Daily, Eveland, Matthew, PA-C .  propofol (DIPRIVAN) 1000 MG/100ML infusion, 0-50 mcg/kg/min, Intravenous, Continuous,  Kinsinger, Arta Bruce, MD, Last Rate: 21.8 mL/hr at 10/24/18 1005, 50 mcg/kg/min at 10/24/18 1005 .  sodium chloride 0.9 % bolus 1,000 mL, 1,000 mL, Intravenous, PRN, Erroll Luna, MD, Last Rate: 983.6 mL/hr at 10/24/18 0816, 500 mL at 10/24/18 0816 Labs CBC    Component Value Date/Time   WBC 7.4 10/24/2018 0400   RBC 3.28 (L) 10/24/2018 0400   HGB 9.7 (L) 10/24/2018 0400   HCT 29.3 (L) 10/24/2018 0400   PLT 143 (L) 10/24/2018 0400   MCV 89.3 10/24/2018 0400   MCH 29.6 10/24/2018 0400   MCHC 33.1 10/24/2018 0400   RDW 14.1 10/24/2018 0400    CMP     Component Value Date/Time   NA 143 10/24/2018 0001   K 3.6 10/24/2018 0001   CL 117 (H) 10/24/2018 0001   CO2 21 (L) 10/24/2018 0001   GLUCOSE 93 10/24/2018 0001   BUN <5 (L) 10/24/2018 0001   CREATININE 0.87 10/24/2018 0001   CALCIUM 7.9 (L) 10/24/2018 0001   PROT 4.6 (L) 10/24/2018 0001   ALBUMIN 2.9 (L) 10/24/2018 0001   AST 33 10/24/2018 0001   ALT 20 10/24/2018 0001   ALKPHOS 37 (L) 10/24/2018 0001   BILITOT 0.5 10/24/2018 0001   GFRNONAA >60 10/24/2018 0001   GFRAA >60 10/24/2018 0001  Imaging I have reviewed images in epic and the results pertinent to this consultation are: MRI brain pending  EEG with generalized slowing.  No seizures.  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 Pending brain MRI to evaluate for any evidence of shear injury/TBI. As said in the prior progress notes, early seizures following TBI are not necessarily good predictors of long-term epilepsy and therefore does not need long-term epileptics but in the short-term, does not need antiepileptic treatment.  Recommendations: Continue Keppra 1 g twice daily for a total of 7 days MRI when feasible EEG completed and unremarkable for any evidence of epileptogenic city.   -- Amie Portland, MD [facility] Pager: [phone] If 7pm to 7am, please call on call as listed on AMION.

## 2018-10-24 NOTE — Progress Notes (Signed)
PT Cancellation Note  Patient Details Name: [name] Hall MRN: [id number] DOB: 11/28/1985   Cancelled Treatment:    Reason Eval/Treat Not Completed: Patient not medically ready. Pt remains on heavy sedation and is not following commands. Pt going to MRI . PT to return as able, as appropriate to complete PT eval.  Lewis ShockAshly Helia Haese, PT, DPT Acute Rehabilitation Services Pager #: [phone] Office #: [phone]    [name] 10/24/2018, 10:37 AM

## 2018-10-25 ENCOUNTER — Other Ambulatory Visit: Payer: Self-pay

## 2018-10-25 ENCOUNTER — Inpatient Hospital Stay (HOSPITAL_COMMUNITY): Payer: BLUE CROSS/BLUE SHIELD

## 2018-10-25 LAB — GLUCOSE, CAPILLARY
Glucose-Capillary: 88 mg/dL (ref 70–99)
Glucose-Capillary: 94 mg/dL (ref 70–99)
Glucose-Capillary: 97 mg/dL (ref 70–99)
Glucose-Capillary: 122 mg/dL — ABNORMAL HIGH (ref 70–99)

## 2018-10-25 LAB — TRIGLYCERIDES: Triglycerides: 146 mg/dL (ref <150)

## 2018-10-25 LAB — MAGNESIUM: Magnesium: 2 mg/dL (ref 1.7–2.4)

## 2018-10-25 LAB — PHOSPHORUS: Phosphorus: 2.2 mg/dL — ABNORMAL LOW (ref 2.5–4.6)

## 2018-10-25 MED ORDER — ADULT MULTIVITAMIN LIQUID CH
15.0000 mL | Freq: Every day | ORAL | Status: DC
  Administered 2018-10-25 – 2018-11-05 (×10): 15 mL
  Filled 2018-10-25 (×10): qty 15

## 2018-10-25 MED ORDER — ALBUTEROL SULFATE (2.5 MG/3ML) 0.083% IN NEBU
2.5000 mg | INHALATION_SOLUTION | Freq: Four times a day (QID) | RESPIRATORY_TRACT | Status: DC
  Administered 2018-10-25 – 2018-11-02 (×32): 2.5 mg via RESPIRATORY_TRACT
  Filled 2018-10-25 (×32): qty 3

## 2018-10-25 MED ORDER — INFLUENZA VAC SPLIT QUAD 0.5 ML IM SUSY
0.5000 mL | PREFILLED_SYRINGE | INTRAMUSCULAR | Status: AC
  Administered 2018-10-26: 0.5 mL via INTRAMUSCULAR
  Filled 2018-10-25: qty 0.5

## 2018-10-25 MED ORDER — PANTOPRAZOLE SODIUM 40 MG PO PACK
40.0000 mg | PACK | Freq: Every day | ORAL | Status: DC
  Administered 2018-10-25 – 2018-11-01 (×8): 40 mg
  Filled 2018-10-25 (×8): qty 20

## 2018-10-25 MED ORDER — PIVOT 1.5 CAL PO LIQD
1000.0000 mL | ORAL | Status: DC
  Administered 2018-10-25 – 2018-11-01 (×6): 1000 mL
  Filled 2018-10-25 (×3): qty 1000

## 2018-10-25 NOTE — Progress Notes (Signed)
PT Cancellation Note  Patient Details Name: [name] Hall MRN: [id number] DOB: 04/12/1986   Cancelled Treatment:    Reason Eval/Treat Not Completed: Patient not medically ready(pt remains intubated and sedated. Per trauma hold til extubation)   [name] [name] 10/25/2018, 9:53 AM  [name], PT Acute Rehabilitation Services Pager: [phone] Office: [phone]

## 2018-10-25 NOTE — Plan of Care (Signed)
Pt currently on tube feeds due to intubation.  Will contact speech for swallow evaluation once patient is extubated.  Will continue to monitor. Susie Mirl Hillery RN 
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
## 2018-10-25 NOTE — Anesthesia Postprocedure Evaluation (Signed)
Anesthesia Post Note  Patient: [name] Hall  Procedure(s) Performed: REPAIR OF RIGHT BRACHIAL ARTERY WITH INTEPOSITONAL VEIN GRAFT USING LEFT GREATER SAPEHNOUS (Right )     Patient location during evaluation: SICU Anesthesia Type: General Level of consciousness: sedated Pain management: pain level controlled Vital Signs Assessment: vitals unstable Respiratory status: patient remains intubated per anesthesia plan and respiratory function unstable Cardiovascular status: stable Postop Assessment: no apparent nausea or vomiting Anesthetic complications: no    Last Vitals:  Vitals:   10/25/18 1700 10/25/18 1800  BP: 127/64 (!) 110/57  Pulse: 92 73  Resp: (!) 22 (!) 22  Temp:    SpO2: 100% 100%    Last Pain:  Vitals:   10/25/18 1600  TempSrc: Axillary  PainSc:                  [name] Hall

## 2018-10-25 NOTE — Progress Notes (Signed)
   VASCULAR SURGERY ASSESSMENT & PLAN:   3 Days Post-Op s/p: Repair of right brachial artery with interposition saphenous vein graft from left leg.  He has a palpable right radial pulse.  There is no significant forearm swelling.  His incision appears to be healing adequately.   SUBJECTIVE:   Sedated on vent.  PHYSICAL EXAM:   Vitals:   10/25/18 0500 10/25/18 0600 10/25/18 0700 10/25/18 0818  BP: (!) 102/53 (!) 105/53 (!) 103/51 112/60  Pulse: 73 72 71 75  Resp: (!) 22 (!) 22 (!) 22 (!) 22  Temp:      TempSrc:      SpO2: 100% 100% 100% 98%  Weight: 91.1 kg     Height:       Palpable right radial pulse. No significant forearm swelling. His incision and wounds are healing well.  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 bacitracin   Topical Daily  . chlorhexidine gluconate (MEDLINE KIT)  15 mL Mouth Rinse BID  . docusate  100 mg Per Tube BID  . enoxaparin (LOVENOX) injection  40 mg Subcutaneous Q24H  . feeding supplement (PRO-STAT SUGAR FREE 64)  30 mL Per Tube BID  . feeding supplement (VITAL HIGH PROTEIN)  1,000 mL Per Tube Q24H  . fentaNYL (SUBLIMAZE) injection  50 mcg Intravenous Once  . fentaNYL (SUBLIMAZE) injection  50 mcg Intravenous Once  . insulin aspart  0-9 Units Subcutaneous TID WC  . mouth rinse  15 mL Mouth Rinse 10 times per day  . pantoprazole (PROTONIX) IV  40 mg Intravenous Daily      Beeper: [phone] Office:  [phone] 10/25/2018  
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
## 2018-10-25 NOTE — Progress Notes (Signed)
OT Cancellation Note  Patient Details Name: [name] Hall MRN: [id number] DOB: [birth year]   Cancelled Treatment:    Reason Eval/Treat Not Completed: Patient not medically ready.  Pt remains intubated and sedated.  Jeani Hawking, OTR/L Acute Rehabilitation Services Pager [phone] Office [phone]   Jeani Hawking M 10/25/2018, 11:02 AM
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
## 2018-10-26 ENCOUNTER — Inpatient Hospital Stay (HOSPITAL_COMMUNITY): Payer: BLUE CROSS/BLUE SHIELD

## 2018-10-26 LAB — GLUCOSE, CAPILLARY
GLUCOSE-CAPILLARY: 57 mg/dL — AB (ref 70–99)
GLUCOSE-CAPILLARY: 75 mg/dL (ref 70–99)
Glucose-Capillary: 103 mg/dL — ABNORMAL HIGH (ref 70–99)
Glucose-Capillary: 115 mg/dL — ABNORMAL HIGH (ref 70–99)
Glucose-Capillary: 113 mg/dL — ABNORMAL HIGH (ref 70–99)
Glucose-Capillary: 72 mg/dL (ref 70–99)
Glucose-Capillary: 73 mg/dL (ref 70–99)
Glucose-Capillary: 89 mg/dL (ref 70–99)
Glucose-Capillary: 95 mg/dL (ref 70–99)
Glucose-Capillary: 124 mg/dL — ABNORMAL HIGH (ref 70–99)
Glucose-Capillary: 117 mg/dL — ABNORMAL HIGH (ref 70–99)

## 2018-10-26 LAB — BASIC METABOLIC PANEL
Anion gap: 5 (ref 5–15)
BUN: 7 mg/dL (ref 6–20)
CO2: 27 mmol/L (ref 22–32)
Calcium: 7.9 mg/dL — ABNORMAL LOW (ref 8.9–10.3)
Chloride: 112 mmol/L — ABNORMAL HIGH (ref 98–111)
Creatinine, Ser: 0.88 mg/dL (ref 0.61–1.24)
GFR calc Af Amer: >60 mL/min (ref >60)
GFR calc non Af Amer: >60 mL/min (ref >60)
Glucose, Bld: 126 mg/dL — ABNORMAL HIGH (ref 70–99)
Potassium: 3 mmol/L — ABNORMAL LOW (ref 3.5–5.1)
Sodium: 144 mmol/L (ref 135–145)

## 2018-10-26 LAB — CBC
HCT: 27 % — ABNORMAL LOW (ref 39.0–52.0)
Hemoglobin: 8.7 g/dL — ABNORMAL LOW (ref 13.0–17.0)
MCH: 28.4 pg (ref 26.0–34.0)
MCHC: 32.2 g/dL (ref 30.0–36.0)
MCV: 88.2 fL (ref 80.0–100.0)
PLATELETS: 158 10*3/uL (ref 150–400)
RBC: 3.06 MIL/uL — ABNORMAL LOW (ref 4.22–5.81)
RDW: 13.4 % (ref 11.5–15.5)
WBC: 5.5 10*3/uL (ref 4.0–10.5)
nRBC: 0 % (ref 0.0–0.2)

## 2018-10-26 MED ORDER — LEVETIRACETAM 100 MG/ML PO SOLN
1000.0000 mg | Freq: Two times a day (BID) | ORAL | Status: AC
  Administered 2018-10-26 – 2018-10-30 (×10): 1000 mg
  Filled 2018-10-26 (×10): qty 10

## 2018-10-26 MED ORDER — BETHANECHOL CHLORIDE 10 MG PO TABS
25.0000 mg | ORAL_TABLET | Freq: Three times a day (TID) | ORAL | Status: DC
  Administered 2018-10-26 – 2018-11-02 (×22): 25 mg
  Filled 2018-10-26 (×23): qty 3

## 2018-10-26 NOTE — Progress Notes (Signed)
PT Cancellation Note  Patient Details Name: [name] Hall AreaJoshua W Jenny MRN: [id number] DOB: 11/29/1985   Cancelled Treatment:    Reason Eval/Treat Not Completed: Medical issues which prohibited therapy(pt remains intubated and per trauma to hold until extubation)   Arlean Thies B Yobana Culliton 10/26/2018, 7:16 AM  Delaney MeigsMaija Tabor Peggi Yono, PT Acute Rehabilitation Services Pager: [phone] Office: [phone]

## 2018-10-26 NOTE — Progress Notes (Addendum)
  Progress Note  VASCULAR SURGERY ASSESSMENT & PLAN:   POD 4: Repair of right brachial artery.  He staples can come out in 10 days.  He has a palpable right radial pulse in his incisions and the left thigh and right arm are healing well.  Vascular surgery will check back next week.  Waverly Ferrari, MD, FACS Beeper [phone] Office: [phone]  10/26/2018 8:28 AM 4 Days Post-Op  Subjective:  Agitated on vent  Tm 100.4  Vitals:   10/26/18 0734 10/26/18 0735  BP: 105/73   Pulse: 82   Resp: (!) 22   Temp:    SpO2: 100% 100%    Physical Exam: Cardiac:  regular Lungs:  intubated Incisions:  Right arm and left leg incisions are clean with staples in tact. Extremities:  Easily palpable right radial pulse  CBC    Component Value Date/Time   WBC 5.5 10/26/2018 0655   RBC 3.06 (L) 10/26/2018 0655   HGB 8.7 (L) 10/26/2018 0655   HCT 27.0 (L) 10/26/2018 0655   PLT 158 10/26/2018 0655   MCV 88.2 10/26/2018 0655   MCH 28.4 10/26/2018 0655   MCHC 32.2 10/26/2018 0655   RDW 13.4 10/26/2018 0655    BMET    Component Value Date/Time   NA 144 10/26/2018 0655   K 3.0 (L) 10/26/2018 0655   CL 112 (H) 10/26/2018 0655   CO2 27 10/26/2018 0655   GLUCOSE 126 (H) 10/26/2018 0655   BUN 7 10/26/2018 0655   CREATININE 0.88 10/26/2018 0655   CALCIUM 7.9 (L) 10/26/2018 0655   GFRNONAA >60 10/26/2018 0655   GFRAA >60 10/26/2018 0655    INR    Component Value Date/Time   INR 1.22 10/22/2018 1801     Intake/Output Summary (Last 24 hours) at 10/26/2018 0828 Last data filed at 10/26/2018 0700 Gross per 24 hour  Intake 3756.78 ml  Output 4175 ml  Net -418.22 ml     Assessment:  33 y.o. male is s/p:  Repair of right brachial artery with interposition saphenous vein graft from left leg.  4 Days Post-Op  Plan: -pt with easily palpable right radial pulse -incisions look good with staples in tact   Doreatha Massed, PA-C [facility] [phone] 10/26/2018 8:28 AM

## 2018-10-26 NOTE — Progress Notes (Signed)
Dr. Raye Sorrow office called to say the pt will be having surgery Monday 1/6th.  I will informed the pt's mother and father.

## 2018-10-26 NOTE — Progress Notes (Signed)
OT Cancellation Note  Patient Details Name: [name] Hall MRN: [id number] DOB: May 09, 1986   Cancelled Treatment:    Reason Eval/Treat Not Completed: Patient not medically ready.  Pt remains intubated.  Jeani Hawking, OTR/L Acute Rehabilitation Services Pager [phone] Office [phone]   Jeani Hawking M 10/26/2018, 9:16 AM

## 2018-10-26 NOTE — Progress Notes (Signed)
Follow up - Trauma Critical Care  Patient Details:    [name] Hall is an 33 y.o. [name] Hall.  Lines/tubes : Airway 7.5 mm (Active)  Secured at (cm) 24 cm 10/26/2018  7:35 AM  Measured From Lips 10/26/2018  7:35 AM  Secured Location Center 10/26/2018  7:35 AM  Secured By [employer] 10/26/2018  7:35 AM  Tube Holder Repositioned Yes 10/26/2018  7:35 AM  Cuff Pressure (cm H2O) 26 cm H2O 10/26/2018  7:35 AM  Site Condition Dry 10/26/2018  7:35 AM     Chest Tube Lateral;Left (Active)  Suction -20 cm H2O 10/25/2018  8:00 PM  Chest Tube Air Leak None 10/25/2018  8:00 PM  Patency Intervention Milked 10/25/2018  8:00 PM  Drainage Description Yellow 10/25/2018  8:00 PM  Dressing Status Clean;Dry;Intact 10/25/2018  8:00 PM  Dressing Intervention Other (Comment) 10/25/2018  8:00 PM  Surrounding Skin Dry;Intact 10/25/2018  8:00 PM  Output (mL) 200 mL 10/26/2018  3:47 AM     NG/OG Tube Orogastric Center mouth Xray  60 cm (Active)  External Length of Tube (cm) - (if applicable) 60 cm 10/24/2018  8:00 AM  Site Assessment Clean;Dry;Intact 10/25/2018  8:00 PM  Ongoing Placement Verification No change in cm markings or external length of tube from initial placement;No change in respiratory status;No acute changes, not attributed to clinical condition;Xray 10/25/2018  8:00 PM  Status Infusing tube feed 10/25/2018  8:00 PM     External Urinary Catheter (Active)    Microbiology/Sepsis markers: No results found for this or any previous visit.  Anti-infectives:  Anti-infectives (From admission, onward)   Start     Dose/Rate Route Frequency Ordered Stop   10/23/18 0200  ceFAZolin (ANCEF) IVPB 2g/100 mL premix     2 g 200 mL/hr over 30 Minutes Intravenous Every 8 hours 10/22/18 2256 10/23/18 1118      Best Practice/Protocols:  VTE Prophylaxis: Lovenox (prophylaxtic dose) Continous Sedation  Consults: Treatment Team:  Flo Shanks, MD Chuck Hint, MD Yolonda Kida, MD     Studies:    Events:  Subjective:    Overnight Issues:   Objective:  Vital signs for last 24 hours: Temp:  [99.2 F (37.3 C)-100.4 F (38 C)] 99.6 F (37.6 C) (01/03 0355) Pulse Rate:  [66-107] 82 (01/03 0734) Resp:  [20-23] 22 (01/03 0734) BP: (103-142)/(47-87) 105/73 (01/03 0734) SpO2:  [82 %-100 %] 100 % (01/03 0735) Arterial Line BP: (114-133)/(50-62) 133/62 (01/02 1000) FiO2 (%):  [30 %-50 %] 40 % (01/03 0735) Weight:  [89.3 kg] 89.3 kg (01/03 0500)  Hemodynamic parameters for last 24 hours:    Intake/Output from previous day: 01/02 0701 - 01/03 0700 In: 4271 [I.V.:3411.3; NG/GT:834.7; IV Piggyback:25] Out: 4175 [Urine:3975; Chest Tube:200]  Intake/Output this shift: No intake/output data recorded.  Vent settings for last 24 hours: Vent Mode: PRVC FiO2 (%):  [30 %-50 %] 40 % Set Rate:  [22 bmp] 22 bmp Vt Set:  [520 mL-530 mL] 520 mL PEEP:  [5 cmH20] 5 cmH20 Plateau Pressure:  [17 cmH20-19 cmH20] 17 cmH20  Physical Exam:  General: on vent Neuro: arouses and waves HEENT/Neck: ETT Resp: clear to auscultation bilaterally CVS: reduced GI: soft, nontender, BS WNL, no r/g Extremities: pulse rue CV correction RRR  Results for orders placed or performed during the hospital encounter of 10/22/18 (from the past 24 hour(s))  Glucose, capillary     Status: None   Collection Time: 10/25/18 11:24 AM  Result Value Ref Range  Glucose-Capillary 88 70 - 99 mg/dL   Comment 1 Notify RN    Comment 2 Document in Chart   Glucose, capillary     Status: None   Collection Time: 10/25/18  3:23 PM  Result Value Ref Range   Glucose-Capillary 94 70 - 99 mg/dL   Comment 1 Notify RN    Comment 2 Document in Chart   Glucose, capillary     Status: None   Collection Time: 10/25/18  8:06 PM  Result Value Ref Range   Glucose-Capillary 97 70 - 99 mg/dL  Triglycerides     Status: None   Collection Time: 10/25/18 10:44 PM  Result Value Ref Range   Triglycerides 146 <150  mg/dL  Glucose, capillary     Status: Abnormal   Collection Time: 10/25/18 11:12 PM  Result Value Ref Range   Glucose-Capillary 122 (H) 70 - 99 mg/dL  Glucose, capillary     Status: Abnormal   Collection Time: 10/26/18  3:20 AM  Result Value Ref Range   Glucose-Capillary 103 (H) 70 - 99 mg/dL  CBC     Status: Abnormal   Collection Time: 10/26/18  6:55 AM  Result Value Ref Range   WBC 5.5 4.0 - 10.5 K/uL   RBC 3.06 (L) 4.22 - 5.81 MIL/uL   Hemoglobin 8.7 (L) 13.0 - 17.0 g/dL   HCT 83.0 (L) 14.1 - 59.7 %   MCV 88.2 80.0 - 100.0 fL   MCH 28.4 26.0 - 34.0 pg   MCHC 32.2 30.0 - 36.0 g/dL   RDW 33.1 25.0 - 87.1 %   Platelets 158 150 - 400 K/uL   nRBC 0.0 0.0 - 0.2 %  Basic metabolic panel     Status: Abnormal   Collection Time: 10/26/18  6:55 AM  Result Value Ref Range   Sodium 144 135 - 145 mmol/L   Potassium 3.0 (L) 3.5 - 5.1 mmol/L   Chloride 112 (H) 98 - 111 mmol/L   CO2 27 22 - 32 mmol/L   Glucose, Bld 126 (H) 70 - 99 mg/dL   BUN 7 6 - 20 mg/dL   Creatinine, Ser 9.94 0.61 - 1.24 mg/dL   Calcium 7.9 (L) 8.9 - 10.3 mg/dL   GFR calc non Af Amer >60 >60 mL/min   GFR calc Af Amer >60 >60 mL/min   Anion gap 5 5 - 15  Glucose, capillary     Status: Abnormal   Collection Time: 10/26/18  7:38 AM  Result Value Ref Range   Glucose-Capillary 115 (H) 70 - 99 mg/dL    Assessment & Plan: Present on Admission: . 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 Injury of brachial artery, right, initial encounter    LOS: 4 days   Additional comments:I reviewed the patient's new clinical lab test results. and CXR [facility] Acute hypoxic ventilator dependent respiratory failure - weaning trial L PTX - place ct on water seal R brachial artery injury - S/P repair by Dr. Durwin Nora 12/30 TBI/post-concussive SZ - appreciate Neurology F/U, Keppra for 7d, MR neg. Plan PT/OT/ST once off vent R MT 1-3 FXs - buddy tape per Dr. Aundria Rud B zygoma, max sinus, orbit FXs, nasal FX - per Dr. Lazarus Salines Acute urinary  retention - start urecholine, I&O FEN - TF VTE - Lovenox Dispo - ICU I spoke with his mother at the bedside Critical Care Total Time*: 66 Minutes  [name], MD, MPH, FACS Trauma: [phone] General Surgery: [phone]  10/26/2018  *Care during the described time interval  was provided by me. I have reviewed this patient's available data, including medical history, events of note, physical examination and test results as part of my evaluation.  Patient ID: [name] Hall, [name] Hall   DOB: 10/20/1986, 33 y.o.   MRN: [id number]

## 2018-10-27 ENCOUNTER — Inpatient Hospital Stay (HOSPITAL_COMMUNITY): Payer: BLUE CROSS/BLUE SHIELD

## 2018-10-27 LAB — GLUCOSE, CAPILLARY
GLUCOSE-CAPILLARY: 107 mg/dL — AB (ref 70–99)
Glucose-Capillary: 98 mg/dL (ref 70–99)
Glucose-Capillary: 133 mg/dL — ABNORMAL HIGH (ref 70–99)
Glucose-Capillary: 94 mg/dL (ref 70–99)
Glucose-Capillary: 97 mg/dL (ref 70–99)
Glucose-Capillary: 113 mg/dL — ABNORMAL HIGH (ref 70–99)

## 2018-10-27 LAB — CBC
HEMATOCRIT: 28.5 % — AB (ref 39.0–52.0)
Hemoglobin: 9.1 g/dL — ABNORMAL LOW (ref 13.0–17.0)
MCH: 28.2 pg (ref 26.0–34.0)
MCHC: 31.9 g/dL (ref 30.0–36.0)
MCV: 88.2 fL (ref 80.0–100.0)
Platelets: 189 10*3/uL (ref 150–400)
RBC: 3.23 MIL/uL — ABNORMAL LOW (ref 4.22–5.81)
RDW: 13.4 % (ref 11.5–15.5)
WBC: 5.7 10*3/uL (ref 4.0–10.5)
nRBC: 0 % (ref 0.0–0.2)

## 2018-10-27 NOTE — Progress Notes (Signed)
PT Cancellation Note  Patient Details Name: [name] Hall MRN: [id number] DOB: 1986/10/04   Cancelled Treatment:    Reason Eval/Treat Not Completed: Patient not medically ready. (orders to hold until off Vent per MD thompson) Noted pending surgery 10/29/18 will look for updated orders s/p surgery   Angelina Ok [facility] 10/27/2018, 1:25 PM  Skip Mayer PT Acute Rehabilitation Services Pager [phone] Office [phone]

## 2018-10-27 NOTE — Progress Notes (Signed)
OT Cancellation Note  Patient Details Name: [name] Hall MRN: [id number] DOB: April 05, 1986   Cancelled Treatment:    Reason Eval/Treat Not Completed: Patient not medically ready(orders to hold until off Vent per MD thompson) Noted pending surgery 10/29/18 will look for updated orders s/p surgery  Fontaine No, OTR/L  Acute Rehabilitation Services Pager: [phone] Office: [phone] .  10/27/2018, 1:06 PM

## 2018-10-27 NOTE — Progress Notes (Signed)
Patient ID: [name] Hall, male   DOB: 09/19/1986, 33 y.o.   MRN: [id number] Follow up - Trauma Critical Care  Patient Details:    [name] Hall is an 33 y.o. male.  Lines/tubes : Airway 7.5 mm (Active)  Secured at (cm) 24 cm 10/27/2018  7:57 AM  Measured From Lips 10/27/2018  7:57 AM  Secured Location Center 10/27/2018  7:57 AM  Secured By [employer] 10/27/2018  7:57 AM  Tube Holder Repositioned Yes 10/27/2018  7:57 AM  Cuff Pressure (cm H2O) 26 cm H2O 10/26/2018  7:35 AM  Site Condition Dry 10/27/2018  7:57 AM     Chest Tube Lateral;Left (Active)  Suction To water seal 10/26/2018  8:00 PM  Chest Tube Air Leak None 10/26/2018  8:00 PM  Patency Intervention Milked 10/26/2018  8:00 PM  Drainage Description Serous 10/26/2018  8:00 PM  Dressing Status Clean;Intact;Dry 10/26/2018  8:00 PM  Dressing Intervention Other (Comment) 10/26/2018  8:00 PM  Site Assessment Clean;Dry;Intact 10/26/2018  8:00 PM  Surrounding Skin Dry;Intact 10/26/2018  8:00 PM  Output (mL) 80 mL 10/27/2018  7:00 AM     NG/OG Tube Orogastric Center mouth Xray  60 cm (Active)  External Length of Tube (cm) - (if applicable) 60 cm 10/24/2018  8:00 AM  Site Assessment Clean;Dry;Intact 10/26/2018  8:00 PM  Ongoing Placement Verification No change in respiratory status 10/26/2018  8:00 PM  Status Infusing tube feed 10/26/2018  8:00 PM     External Urinary Catheter (Active)  Collection Container Standard drainage bag 10/26/2018  8:00 PM  Intervention Equipment Changed 10/26/2018  8:00 AM  Output (mL) 1000 mL 10/27/2018  7:00 AM    Microbiology/Sepsis markers: No results found for this or any previous visit.  Anti-infectives:  Anti-infectives (From admission, onward)   Start     Dose/Rate Route Frequency Ordered Stop   10/23/18 0200  ceFAZolin (ANCEF) IVPB 2g/100 mL premix     2 g 200 mL/hr over 30 Minutes Intravenous Every 8 hours 10/22/18 2256 10/23/18 1118      Best Practice/Protocols:  VTE Prophylaxis: Lovenox (prophylaxtic  dose) Continous Sedation  Consults: Treatment Team:  Flo Shanks, MD Chuck Hint, MD Yolonda Kida, MD    Studies:    Events:  Subjective:    Overnight Issues:   Objective:  Vital signs for last 24 hours: Temp:  [98.4 F (36.9 C)-100.2 F (37.9 C)] 100.2 F (37.9 C) (01/04 0800) Pulse Rate:  [71-104] 84 (01/04 0800) Resp:  [15-22] 22 (01/04 0800) BP: (109-155)/(58-88) 113/65 (01/04 0800) SpO2:  [90 %-100 %] 100 % (01/04 0800) FiO2 (%):  [40 %-60 %] 50 % (01/04 0757) Weight:  [92.3 kg] 92.3 kg (01/04 0441)  Hemodynamic parameters for last 24 hours:    Intake/Output from previous day: 01/03 0701 - 01/04 0700 In: 4398 [I.V.:3318; NG/GT:1080] Out: 3480 [Urine:3400; Chest Tube:80]  Intake/Output this shift: No intake/output data recorded.  Vent settings for last 24 hours: Vent Mode: PRVC FiO2 (%):  [40 %-60 %] 50 % Set Rate:  [22 bmp] 22 bmp Vt Set:  [520 mL] 520 mL PEEP:  [5 cmH20] 5 cmH20 Plateau Pressure:  [10 cmH20-23 cmH20] 20 cmH20  Physical Exam:  General: on vent Neuro: sedated HEENT/Neck: ETT and nasal dressing Resp: clear to auscultation bilaterally CVS: regular rate and rhythm, S1, S2 normal, no murmur, click, rub or gallop GI: soft, nontender, BS WNL, no r/g Extremities: contusion R ankle  Results for orders placed or performed  during the hospital encounter of 10/22/18 (from the past 24 hour(s))  Glucose, capillary     Status: Abnormal   Collection Time: 10/26/18 11:32 AM  Result Value Ref Range   Glucose-Capillary 113 (H) 70 - 99 mg/dL  Glucose, capillary     Status: None   Collection Time: 10/26/18  3:29 PM  Result Value Ref Range   Glucose-Capillary 95 70 - 99 mg/dL  Glucose, capillary     Status: Abnormal   Collection Time: 10/26/18  7:49 PM  Result Value Ref Range   Glucose-Capillary 124 (H) 70 - 99 mg/dL  Glucose, capillary     Status: Abnormal   Collection Time: 10/26/18 11:10 PM  Result Value Ref Range    Glucose-Capillary 117 (H) 70 - 99 mg/dL  Glucose, capillary     Status: Abnormal   Collection Time: 10/27/18  3:07 AM  Result Value Ref Range   Glucose-Capillary 107 (H) 70 - 99 mg/dL  CBC     Status: Abnormal   Collection Time: 10/27/18  4:43 AM  Result Value Ref Range   WBC 5.7 4.0 - 10.5 K/uL   RBC 3.23 (L) 4.22 - 5.81 MIL/uL   Hemoglobin 9.1 (L) 13.0 - 17.0 g/dL   HCT 14.7 (L) 09.2 - 95.7 %   MCV 88.2 80.0 - 100.0 fL   MCH 28.2 26.0 - 34.0 pg   MCHC 31.9 30.0 - 36.0 g/dL   RDW 47.3 40.3 - 70.9 %   Platelets 189 150 - 400 K/uL   nRBC 0.0 0.0 - 0.2 %  Glucose, capillary     Status: None   Collection Time: 10/27/18  7:49 AM  Result Value Ref Range   Glucose-Capillary 98 70 - 99 mg/dL   Comment 1 Notify RN    Comment 2 Document in Chart     Assessment & Plan: Present on Admission: . 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 Injury of brachial artery, right, initial encounter    LOS: 5 days   Additional comments:I reviewed the patient's new clinical lab test results. . [facility] Acute hypoxic ventilator dependent respiratory failure - weaning, will not extubate with surgery Monday L PTX - D/C chest tube R brachial artery injury - S/P repair by Dr. Durwin Nora 12/30 TBI/post-concussive SZ - appreciate Neurology F/U, Keppra for 7d, MR neg. Plan PT/OT/ST once off vent R MT 1-3 FXs - buddy tape per Dr. Aundria Rud B zygoma, max sinus, orbit FXs, nasal FX - per Dr. Lazarus Salines Acute urinary retention - start urecholine, I&O FEN - TF VTE - Lovenox Dispo - ICU I spoke with his mother at the bedside Critical Care Total Time*: 67 Minutes  [name] Gelinas, MD, MPH, FACS Trauma: [phone] General Surgery: [phone]  10/27/2018  *Care during the described time interval was provided by me. I have reviewed this patient's available data, including medical history, events of note, physical examination and test results as part of my evaluation.

## 2018-10-27 NOTE — Progress Notes (Addendum)
Called due to patients sats and HR dropping. Patient biting down on ETT at this time and is dyssynchronous with the ventilator. Attempted to place a bite block with the assistance of the RN, however, we cant get his mouth to open enough. Bite block is halfway in with him biting down on it. Left in place until able to advance farther into mouth. HR and sats back to normal and stable at this time. Will inform oncoming RT.

## 2018-10-28 ENCOUNTER — Inpatient Hospital Stay (HOSPITAL_COMMUNITY): Payer: BLUE CROSS/BLUE SHIELD

## 2018-10-28 LAB — GLUCOSE, CAPILLARY
GLUCOSE-CAPILLARY: 107 mg/dL — AB (ref 70–99)
Glucose-Capillary: 113 mg/dL — ABNORMAL HIGH (ref 70–99)
Glucose-Capillary: 98 mg/dL (ref 70–99)
Glucose-Capillary: 94 mg/dL (ref 70–99)
Glucose-Capillary: 100 mg/dL — ABNORMAL HIGH (ref 70–99)
Glucose-Capillary: 123 mg/dL — ABNORMAL HIGH (ref 70–99)

## 2018-10-28 LAB — BASIC METABOLIC PANEL
ANION GAP: 6 (ref 5–15)
BUN: 9 mg/dL (ref 6–20)
CO2: 27 mmol/L (ref 22–32)
Calcium: 8.1 mg/dL — ABNORMAL LOW (ref 8.9–10.3)
Chloride: 109 mmol/L (ref 98–111)
Creatinine, Ser: 0.67 mg/dL (ref 0.61–1.24)
GFR calc non Af Amer: >60 mL/min (ref >60)
Glucose, Bld: 136 mg/dL — ABNORMAL HIGH (ref 70–99)
Potassium: 3.4 mmol/L — ABNORMAL LOW (ref 3.5–5.1)
Sodium: 142 mmol/L (ref 135–145)

## 2018-10-28 LAB — CBC
HCT: 29.7 % — ABNORMAL LOW (ref 39.0–52.0)
Hemoglobin: 9.6 g/dL — ABNORMAL LOW (ref 13.0–17.0)
MCH: 29.5 pg (ref 26.0–34.0)
MCHC: 32.3 g/dL (ref 30.0–36.0)
MCV: 91.4 fL (ref 80.0–100.0)
Platelets: 192 10*3/uL (ref 150–400)
RBC: 3.25 MIL/uL — ABNORMAL LOW (ref 4.22–5.81)
RDW: 13.7 % (ref 11.5–15.5)
WBC: 7.4 10*3/uL (ref 4.0–10.5)
nRBC: 0.3 % — ABNORMAL HIGH (ref 0.0–0.2)

## 2018-10-28 MED ORDER — POTASSIUM CHLORIDE 20 MEQ/15ML (10%) PO SOLN
20.0000 meq | Freq: Once | ORAL | Status: AC
  Administered 2018-10-28: 20 meq via ORAL
  Filled 2018-10-28: qty 15

## 2018-10-28 MED ORDER — QUETIAPINE FUMARATE 100 MG PO TABS
100.0000 mg | ORAL_TABLET | Freq: Two times a day (BID) | ORAL | Status: DC
  Administered 2018-10-28 – 2018-10-31 (×8): 100 mg via ORAL
  Filled 2018-10-28 (×8): qty 1

## 2018-10-28 NOTE — Progress Notes (Signed)
Patient ID: [name] Hall, male   DOB: 17-Apr-1986, 33 y.o.   MRN: [id number] Follow up - Trauma Critical Care  Patient Details:    [name] Hall is an 33 y.o. male.  Lines/tubes : Airway 7.5 mm (Active)  Secured at (cm) 24 cm 10/28/2018  3:35 AM  Measured From Lips 10/28/2018  3:35 AM  Secured Location Left 10/28/2018  3:35 AM  Secured By [employer] 10/28/2018  3:35 AM  Tube Holder Repositioned Yes 10/28/2018  3:35 AM  Cuff Pressure (cm H2O) 28 cm H2O 10/27/2018  8:08 PM  Site Condition Dry 10/28/2018  3:35 AM     NG/OG Tube Orogastric Center mouth Xray  60 cm (Active)  External Length of Tube (cm) - (if applicable) 56 cm 10/27/2018  8:00 PM  Site Assessment Clean;Dry;Intact 10/27/2018  8:00 PM  Ongoing Placement Verification No change in respiratory status 10/27/2018  8:00 PM  Status Infusing tube feed 10/27/2018  8:00 PM     External Urinary Catheter (Active)  Collection Container Standard drainage bag 10/27/2018  8:00 PM  Intervention Equipment Changed 10/26/2018  8:00 AM  Output (mL) 1250 mL 10/28/2018  7:00 AM    Microbiology/Sepsis markers: No results found for this or any previous visit.  Anti-infectives:  Anti-infectives (From admission, onward)   Start     Dose/Rate Route Frequency Ordered Stop   10/23/18 0200  ceFAZolin (ANCEF) IVPB 2g/100 mL premix     2 g 200 mL/hr over 30 Minutes Intravenous Every 8 hours 10/22/18 2256 10/23/18 1118      Best Practice/Protocols:  VTE Prophylaxis: Lovenox (prophylaxtic dose) GI Prophylaxis: Proton Pump Inhibitor Continous Sedation  Consults: Treatment Team:  Flo Shanks, MD Chuck Hint, MD Yolonda Kida, MD    Studies:    Events:  Subjective:    Overnight Issues:  Mother reports pt restless at times Objective:  Vital signs for last 24 hours: Temp:  [98.5 F (36.9 C)-100.3 F (37.9 C)] 99.7 F (37.6 C) (01/05 0400) Pulse Rate:  [73-107] 86 (01/05 0700) Resp:  [19-22] 22 (01/05 0700) BP:  (101-127)/(45-81) 122/70 (01/05 0700) SpO2:  [88 %-100 %] 100 % (01/05 0700) FiO2 (%):  [40 %-50 %] 50 % (01/05 0335) Weight:  [93.4 kg] 93.4 kg (01/05 0500)  Hemodynamic parameters for last 24 hours:    Intake/Output from previous day: 01/04 0701 - 01/05 0700 In: 4843.9 [I.V.:3883.9; NG/GT:960] Out: 3200 [Urine:3200]  Intake/Output this shift: No intake/output data recorded.  Vent settings for last 24 hours: Vent Mode: PRVC FiO2 (%):  [40 %-50 %] 50 % Set Rate:  [22 bmp] 22 bmp Vt Set:  [520 mL] 520 mL PEEP:  [5 cmH20] 5 cmH20 Plateau Pressure:  [16 cmH20-22 cmH20] 16 cmH20  Physical Exam:  General: on vent Neuro: sedated HEENT/Neck: ETT and nasal dressing Resp: clear to auscultation bilaterally CVS: regular rate and rhythm, S1, S2 normal, no murmur, click, rub or gallop GI: soft, nontender, BS WNL, no r/g Extremities: contusion R ankle; some edema b/l feet R>L  Results for orders placed or performed during the hospital encounter of 10/22/18 (from the past 24 hour(s))  Glucose, capillary     Status: Abnormal   Collection Time: 10/27/18 11:39 AM  Result Value Ref Range   Glucose-Capillary 133 (H) 70 - 99 mg/dL   Comment 1 Notify RN    Comment 2 Document in Chart   Glucose, capillary     Status: None   Collection Time: 10/27/18  3:48  PM  Result Value Ref Range   Glucose-Capillary 94 70 - 99 mg/dL   Comment 1 Notify RN    Comment 2 Document in Chart   Glucose, capillary     Status: None   Collection Time: 10/27/18  7:26 PM  Result Value Ref Range   Glucose-Capillary 97 70 - 99 mg/dL  Glucose, capillary     Status: Abnormal   Collection Time: 10/27/18 11:27 PM  Result Value Ref Range   Glucose-Capillary 113 (H) 70 - 99 mg/dL  CBC     Status: Abnormal   Collection Time: 10/28/18  3:08 AM  Result Value Ref Range   WBC 7.4 4.0 - 10.5 K/uL   RBC 3.25 (L) 4.22 - 5.81 MIL/uL   Hemoglobin 9.6 (L) 13.0 - 17.0 g/dL   HCT 28.429.7 (L) 13.239.0 - 44.052.0 %   MCV 91.4 80.0 - 100.0  fL   MCH 29.5 26.0 - 34.0 pg   MCHC 32.3 30.0 - 36.0 g/dL   RDW 10.213.7 72.511.5 - 36.615.5 %   Platelets 192 150 - 400 K/uL   nRBC 0.3 (H) 0.0 - 0.2 %  Basic metabolic panel     Status: Abnormal   Collection Time: 10/28/18  3:08 AM  Result Value Ref Range   Sodium 142 135 - 145 mmol/L   Potassium 3.4 (L) 3.5 - 5.1 mmol/L   Chloride 109 98 - 111 mmol/L   CO2 27 22 - 32 mmol/L   Glucose, Bld 136 (H) 70 - 99 mg/dL   BUN 9 6 - 20 mg/dL   Creatinine, Ser 4.400.67 0.61 - 1.24 mg/dL   Calcium 8.1 (L) 8.9 - 10.3 mg/dL   GFR calc non Af Amer >60 >60 mL/min   GFR calc Af Amer >60 >60 mL/min   Anion gap 6 5 - 15  Glucose, capillary     Status: Abnormal   Collection Time: 10/28/18  3:45 AM  Result Value Ref Range   Glucose-Capillary 113 (H) 70 - 99 mg/dL  Glucose, capillary     Status: Abnormal   Collection Time: 10/28/18  8:04 AM  Result Value Ref Range   Glucose-Capillary 107 (H) 70 - 99 mg/dL   Comment 1 Notify RN    Comment 2 Document in Chart     Assessment & Plan: Present on Admission: . 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 Injury of brachial artery, right, [facility] Acute hypoxic ventilator dependent respiratory failure - weaning, will not extubate with surgery Monday; agitated at times, will start seroquel L PTX - D/C chest tube R brachial artery injury - S/P repair by Dr. Durwin Noraixon 12/30 TBI/post-concussive SZ - appreciate Neurology F/U, Keppra for 7d, MR neg. Plan PT/OT/ST once off vent R MT 1-3 FXs - buddy tape per Dr. Aundria Rudogers B zygoma, max sinus, orbit FXs, nasal FX - per Dr. Lazarus SalinesWolicki Acute urinary retention - start urecholine, I&O FEN - TF, hypokalemia - replace potassium; on goal TF will decrease mIVF; will stop TF at 0200 for planned ENT surgery for 1/6 VTE - Lovenox Dispo - ICU I spoke with his mother at the bedside   LOS: 6 days   Additional comments:I reviewed the patient's new clinical lab test results.  and I reviewed the patients new imaging test results.   Critical  Care Total Time*: 30 Minutes  Mary SellaEric M. Andrey CampanileWilson, MD, FACS General, Bariatric, & Minimally Invasive Surgery [facility] [place]   10/28/2018  *Care during the described time interval was provided by  me. I have reviewed this patient's available data, including medical history, events of note, physical examination and test results as part of my evaluation.

## 2018-10-28 NOTE — Progress Notes (Signed)
Patient vomited one time and desated to 88%. RN and RT at bedside. Tube feeding turned off. PRN Zofran given. RN to continue to monitor.

## 2018-10-29 ENCOUNTER — Inpatient Hospital Stay (HOSPITAL_COMMUNITY): Payer: BLUE CROSS/BLUE SHIELD | Admitting: Anesthesiology

## 2018-10-29 ENCOUNTER — Encounter (HOSPITAL_COMMUNITY): Admission: EM | Disposition: A | Discharge status: Rehabilitation Facility | Payer: Self-pay | Source: Home / Self Care

## 2018-10-29 HISTORY — PX: CLOSED REDUCTION NASAL FRACTURE: SHX5365

## 2018-10-29 LAB — BASIC METABOLIC PANEL
Anion gap: 11 (ref 5–15)
BUN: 13 mg/dL (ref 6–20)
CALCIUM: 8.6 mg/dL — AB (ref 8.9–10.3)
CO2: 23 mmol/L (ref 22–32)
Chloride: 112 mmol/L — ABNORMAL HIGH (ref 98–111)
Creatinine, Ser: 0.8 mg/dL (ref 0.61–1.24)
GFR calc Af Amer: >60 mL/min (ref >60)
GFR calc non Af Amer: >60 mL/min (ref >60)
GLUCOSE: 97 mg/dL (ref 70–99)
Potassium: 3.7 mmol/L (ref 3.5–5.1)
Sodium: 146 mmol/L — ABNORMAL HIGH (ref 135–145)

## 2018-10-29 LAB — GLUCOSE, CAPILLARY
GLUCOSE-CAPILLARY: 93 mg/dL (ref 70–99)
Glucose-Capillary: 116 mg/dL — ABNORMAL HIGH (ref 70–99)
Glucose-Capillary: 81 mg/dL (ref 70–99)
Glucose-Capillary: 104 mg/dL — ABNORMAL HIGH (ref 70–99)
Glucose-Capillary: 96 mg/dL (ref 70–99)

## 2018-10-29 LAB — CBC
HCT: 33.3 % — ABNORMAL LOW (ref 39.0–52.0)
Hemoglobin: 10.1 g/dL — ABNORMAL LOW (ref 13.0–17.0)
MCH: 28.9 pg (ref 26.0–34.0)
MCHC: 30.3 g/dL (ref 30.0–36.0)
MCV: 95.1 fL (ref 80.0–100.0)
Platelets: 261 10*3/uL (ref 150–400)
RBC: 3.5 MIL/uL — ABNORMAL LOW (ref 4.22–5.81)
RDW: 13.9 % (ref 11.5–15.5)
WBC: 6.7 10*3/uL (ref 4.0–10.5)
nRBC: 0.4 % — ABNORMAL HIGH (ref 0.0–0.2)

## 2018-10-29 LAB — MAGNESIUM: Magnesium: 2.1 mg/dL (ref 1.7–2.4)

## 2018-10-29 LAB — TRIGLYCERIDES: Triglycerides: 157 mg/dL — ABNORMAL HIGH (ref <150)

## 2018-10-29 SURGERY — CLOSED REDUCTION, FRACTURE, NASAL BONE
Anesthesia: General | Site: Nose

## 2018-10-29 MED ORDER — PROPOFOL 10 MG/ML IV BOLUS
INTRAVENOUS | Status: DC | PRN
  Administered 2018-10-29 (×2): 50 mg via INTRAVENOUS

## 2018-10-29 MED ORDER — MIDAZOLAM HCL 2 MG/2ML IJ SOLN
INTRAMUSCULAR | Status: AC
  Filled 2018-10-29: qty 2

## 2018-10-29 MED ORDER — CEFAZOLIN SODIUM-DEXTROSE 2-3 GM-%(50ML) IV SOLR
INTRAVENOUS | Status: DC | PRN
  Administered 2018-10-29: 2 g via INTRAVENOUS

## 2018-10-29 MED ORDER — PROPOFOL 10 MG/ML IV BOLUS
INTRAVENOUS | Status: AC
  Filled 2018-10-29: qty 20

## 2018-10-29 MED ORDER — LIDOCAINE-EPINEPHRINE 1 %-1:100000 IJ SOLN
INTRAMUSCULAR | Status: AC
  Filled 2018-10-29: qty 1

## 2018-10-29 MED ORDER — CEFAZOLIN SODIUM-DEXTROSE 2-4 GM/100ML-% IV SOLN: 2.0000 g | INTRAVENOUS | Status: DC

## 2018-10-29 MED ORDER — OXYMETAZOLINE HCL 0.05 % NA SOLN: 2.0000 | NASAL | Status: DC | PRN

## 2018-10-29 MED ORDER — LACTATED RINGERS IV SOLN
INTRAVENOUS | Status: DC | PRN
  Administered 2018-10-29: 13:00:00 via INTRAVENOUS

## 2018-10-29 MED ORDER — OXYMETAZOLINE HCL 0.05 % NA SOLN
NASAL | Status: AC
  Filled 2018-10-29: qty 15

## 2018-10-29 MED ORDER — MIDAZOLAM HCL 5 MG/5ML IJ SOLN
INTRAMUSCULAR | Status: DC | PRN
  Administered 2018-10-29: 2 mg via INTRAVENOUS

## 2018-10-29 MED ORDER — FENTANYL CITRATE (PF) 250 MCG/5ML IJ SOLN
INTRAMUSCULAR | Status: AC
  Filled 2018-10-29: qty 5

## 2018-10-29 MED ORDER — FENTANYL CITRATE (PF) 250 MCG/5ML IJ SOLN
INTRAMUSCULAR | Status: DC | PRN
  Administered 2018-10-29: 150 ug via INTRAVENOUS
  Administered 2018-10-29: 100 ug via INTRAVENOUS

## 2018-10-29 MED ORDER — 0.9 % SODIUM CHLORIDE (POUR BTL) OPTIME
TOPICAL | Status: DC | PRN
  Administered 2018-10-29: 1000 mL

## 2018-10-29 MED ORDER — SENNOSIDES 8.8 MG/5ML PO SYRP
5.0000 mL | ORAL_SOLUTION | Freq: Two times a day (BID) | ORAL | Status: DC
  Administered 2018-10-29 – 2018-11-02 (×9): 5 mL
  Filled 2018-10-29 (×10): qty 5

## 2018-10-29 MED ORDER — NEOMYCIN-POLYMYXIN-HC OP SUSP
OPHTHALMIC | Status: DC | PRN
  Administered 2018-10-29: 10 mL via TOPICAL

## 2018-10-29 MED ORDER — OXYMETAZOLINE HCL 0.05 % NA SOLN
NASAL | Status: DC | PRN
  Administered 2018-10-29: 4 via TOPICAL

## 2018-10-29 MED FILL — Neomycin-Polymyxin-HC Otic Soln 1%: OTIC | Qty: 10 | Status: AC

## 2018-10-29 SURGICAL SUPPLY — 32 items
BLADE SURG 15 STRL LF DISP TIS (BLADE) IMPLANT
BLADE SURG 15 STRL SS (BLADE)
CANISTER SUCT 3000ML PPV (MISCELLANEOUS) ×3 IMPLANT
COVER BACK TABLE 60X90IN (DRAPES) ×3 IMPLANT
COVER MAYO STAND STRL (DRAPES) ×3 IMPLANT
COVER WAND RF STERILE (DRAPES) ×3 IMPLANT
CRADLE DONUT ADULT HEAD (MISCELLANEOUS) IMPLANT
DRAPE HALF SHEET 40X57 (DRAPES) IMPLANT
DRESSING NASAL KENNEDY 3.5X.9 (MISCELLANEOUS) ×2 IMPLANT
DRESSING NASAL POPE 10X1.5X2.5 (GAUZE/BANDAGES/DRESSINGS) ×2 IMPLANT
DRSG NASAL KENNEDY 3.5X.9 (MISCELLANEOUS) ×6
DRSG NASAL POPE 10X1.5X2.5 (GAUZE/BANDAGES/DRESSINGS) ×12
GAUZE 4X4 16PLY RFD (DISPOSABLE) ×3 IMPLANT
GAUZE SPONGE 2X2 8PLY STRL LF (GAUZE/BANDAGES/DRESSINGS) ×1 IMPLANT
GLOVE ECLIPSE 8.0 STRL XLNG CF (GLOVE) IMPLANT
GOWN STRL REUS W/ TWL LRG LVL3 (GOWN DISPOSABLE) ×1 IMPLANT
GOWN STRL REUS W/TWL LRG LVL3 (GOWN DISPOSABLE) ×2
KIT BASIN OR (CUSTOM PROCEDURE TRAY) ×3 IMPLANT
KIT SPLINT NASAL DENVER SM BEI (GAUZE/BANDAGES/DRESSINGS) ×2 IMPLANT
KIT TURNOVER KIT B (KITS) ×3 IMPLANT
NDL HYPO 25GX1X1/2 BEV (NEEDLE) IMPLANT
NEEDLE HYPO 25GX1X1/2 BEV (NEEDLE) IMPLANT
NS IRRIG 1000ML POUR BTL (IV SOLUTION) ×3 IMPLANT
PAD ABD 8X10 STRL (GAUZE/BANDAGES/DRESSINGS) ×2 IMPLANT
PAD ARMBOARD 7.5X6 YLW CONV (MISCELLANEOUS) ×6 IMPLANT
PATTIES SURGICAL .5 X3 (DISPOSABLE) IMPLANT
SPONGE GAUZE 2X2 STER 10/PKG (GAUZE/BANDAGES/DRESSINGS) ×2
SYR CONTROL 10ML LL (SYRINGE) IMPLANT
TOWEL OR 17X24 6PK STRL BLUE (TOWEL DISPOSABLE) ×6 IMPLANT
TUBE CONNECTING 12'X1/4 (SUCTIONS) ×1
TUBE CONNECTING 12X1/4 (SUCTIONS) ×2 IMPLANT
WATER STERILE IRR 1000ML POUR (IV SOLUTION) ×3 IMPLANT

## 2018-10-29 NOTE — Progress Notes (Signed)
Chaplain visited with mother.  Follow-up visit, post surgery.  Mother encouraged that son opens eyes on command and moves feet when told to move his toes.  Provided ministry of presence. Will continue to follow. Lynnell Chad Pager [phone]

## 2018-10-29 NOTE — Progress Notes (Addendum)
[facility]  Subjective  - Intubated, but responsive.   Objective 116/70 96 (!) 101.7 F (38.7 C) (Oral) (!) 22 100%  Intake/Output Summary (Last 24 hours) at 10/29/2018 0739 Last data filed at 10/29/2018 0600 Gross per 24 hour  Intake 3268.03 ml  Output 2950 ml  Net 318.03 ml    Right UE with palpable radial pulse Right UE incision healing well without erythema  Assessment/Planning: POD # 7 REPAIR OF RIGHT BRACHIAL ARTERY WITH INTERPOSITION VEIN GRAFT (LEFT GREAT SAPHENOUS VEIN)  Patent blood flow to the right UE  [name] Pigeonmma Maureen Hall 10/29/2018 7:39 AM -- I have interviewed the patient and examined the patient. I agree with the findings by the PA. OK to remove every other staple from left thigh and right upper arm.   Cari Carawayhris Dickson, MD [phone]

## 2018-10-29 NOTE — Transfer of Care (Signed)
Immediate Anesthesia Transfer of Care Note  Patient: [name] Hall  Procedure(s) Performed: CLOSED REDUCTION WITH STABILIZATION NASAL AND NASALSEPTAL  FRACTURES (N/A Nose)  Patient Location: ICU  Anesthesia Type:General  Level of Consciousness: sedated and Patient remains intubated per anesthesia plan  Airway & Oxygen Therapy: Patient remains intubated per anesthesia plan and Patient placed on Ventilator (see vital sign flow sheet for setting)  Post-op Assessment: Report given to RN and Post -op Vital signs reviewed and stable  Post vital signs: Reviewed and stable  Last Vitals:  Vitals Value Taken Time  BP    Temp    Pulse 88 10/29/2018  1:34 PM  Resp 20 10/29/2018  1:35 PM  SpO2 94 % 10/29/2018  1:34 PM  Vitals shown include unvalidated device data.  Last Pain:  Vitals:   10/29/18 0800  TempSrc: Axillary  PainSc:          Complications: No apparent anesthesia complications

## 2018-10-29 NOTE — Progress Notes (Signed)
10/29/2018 12:11 PM  Karie Kirks [id number]      Temp:  [99 F (37.2 C)-101.7 F (38.7 C)] 99.7 F (37.6 C) (01/06 0800) Pulse Rate:  [80-127] 90 (01/06 1200) Resp:  [18-22] 22 (01/06 1200) BP: (94-132)/(53-94) 96/58 (01/06 1200) SpO2:  [88 %-100 %] 99 % (01/06 1200) FiO2 (%):  [40 %-100 %] 40 % (01/06 1106),     Intake/Output Summary (Last 24 hours) at 10/29/2018 1211 Last data filed at 10/29/2018 1200 Gross per 24 hour  Intake 2911.44 ml  Output 1950 ml  Net 961.44 ml    Results for orders placed or performed during the hospital encounter of 10/22/18 (from the past 24 hour(s))  Glucose, capillary     Status: None   Collection Time: 10/28/18  3:58 PM  Result Value Ref Range   Glucose-Capillary 94 70 - 99 mg/dL   Comment 1 Notify RN    Comment 2 Document in Chart   Glucose, capillary     Status: Abnormal   Collection Time: 10/28/18  7:31 PM  Result Value Ref Range   Glucose-Capillary 100 (H) 70 - 99 mg/dL  Glucose, capillary     Status: Abnormal   Collection Time: 10/28/18 11:10 PM  Result Value Ref Range   Glucose-Capillary 123 (H) 70 - 99 mg/dL  Triglycerides     Status: Abnormal   Collection Time: 10/28/18 11:12 PM  Result Value Ref Range   Triglycerides 157 (H) <150 mg/dL  Glucose, capillary     Status: Abnormal   Collection Time: 10/29/18  3:44 AM  Result Value Ref Range   Glucose-Capillary 116 (H) 70 - 99 mg/dL  Basic metabolic panel     Status: Abnormal   Collection Time: 10/29/18  4:48 AM  Result Value Ref Range   Sodium 146 (H) 135 - 145 mmol/L   Potassium 3.7 3.5 - 5.1 mmol/L   Chloride 112 (H) 98 - 111 mmol/L   CO2 23 22 - 32 mmol/L   Glucose, Bld 97 70 - 99 mg/dL   BUN 13 6 - 20 mg/dL   Creatinine, Ser 1.19 0.61 - 1.24 mg/dL   Calcium 8.6 (L) 8.9 - 10.3 mg/dL   GFR calc non Af Amer >60 >60 mL/min   GFR calc Af Amer >60 >60 mL/min   Anion gap 11 5 - 15  CBC     Status: Abnormal   Collection Time: 10/29/18  4:48 AM  Result Value Ref Range   WBC 6.7 4.0 - 10.5 K/uL   RBC 3.50 (L) 4.22 - 5.81 MIL/uL   Hemoglobin 10.1 (L) 13.0 - 17.0 g/dL   HCT 41.7 (L) 40.8 - 14.4 %   MCV 95.1 80.0 - 100.0 fL   MCH 28.9 26.0 - 34.0 pg   MCHC 30.3 30.0 - 36.0 g/dL   RDW 81.8 56.3 - 14.9 %   Platelets 261 150 - 400 K/uL   nRBC 0.4 (H) 0.0 - 0.2 %  Magnesium     Status: None   Collection Time: 10/29/18  4:48 AM  Result Value Ref Range   Magnesium 2.1 1.7 - 2.4 mg/dL  Glucose, capillary     Status: None   Collection Time: 10/29/18  7:41 AM  Result Value Ref Range   Glucose-Capillary 81 70 - 99 mg/dL   Comment 1 Notify RN    Comment 2 Document in Chart   Glucose, capillary     Status: None   Collection Time: 10/29/18 11:28 AM  Result Value Ref  Range   Glucose-Capillary 93 70 - 99 mg/dL   Comment 1 Notify RN    Comment 2 Document in Chart     SUBJECTIVE:  Pt still on vent.  Discussed closed reduction with Mom last week.    OBJECTIVE:    Nasal swelling reducing.  IMPRESSION:  Closed displaced nasal and nasal septal fx  PLAN:  For closed reduction with stabilization nasal and nasal septal fx's today.  Zola Button [facility]

## 2018-10-29 NOTE — Op Note (Signed)
10/29/2018  1:10 PM    Goku, Keleman  [id number]   Pre-Op Dx: Closed comminuted displaced nasal and nasal septal fracture.  Post-op Dx: Same  Proc: Close reduction nasal and nasal septal fracture.  Surg:  Flo Shanks T MD  Anes:  GOT  EBL: Minimal  Comp: None  Findings: A small bone chip at the dorsum of the nose, removed.  Flattening and rightward displacement of the dorsum.  Severe corrugation and displacement of the septum.  Laceration of the anterior pole of the right inferior turbinate.  Procedure: With the patient in a comfortable supine position, anesthesia was administered per indwelling orotracheal tube.  CT scans were reviewed.  Routine surgical timeout was obtained.  The nose was examined with the findings as described above.  The internal nose was suctioned clear, and Afrin solution was applied on cottonoids to both sides.  Several minutes were allowed for this to take effect.  The materials were removed from the nose and observed to be intact and correct in number.  The findings were as described above.  The blunt fracture elevator was measured to the level of the medial canthus and placed under the bony dorsum of the nose, and the fractures were mobilized.  An Ash forceps was placed and the septum was mobilized into a more midline position.  A 10 cm Merocel sponge was placed at the side of the nose between the septum and the turbinates.  These were moistened with Cortisporin otic suspension.  Hemostasis was observed.  A small-medium [place] splint was prepared.  The external nose was cleaned with alcohol and painted with skin prep.  Steri-Strips were applied in the standard fashion.  Den[place] splint was applied and compressed somewhat for support  and midline configuration of the dorsum.    At this point the procedure was completed.  The patient was returned to anesthesia, awakened, extubated, and transferred to neurosurgical intensive care in satisfactory  condition.    Dispo:   OR to neurosurgical ICU  Plan: Ice, elevation, antibiosis, analgesia.  We will remove the  internal packs in 7 days, the external splint in 10 days.  Cephus Richer MD

## 2018-10-29 NOTE — Anesthesia Postprocedure Evaluation (Signed)
Anesthesia Post Note  Patient: [name] Hall  Procedure(s) Performed: CLOSED REDUCTION WITH STABILIZATION NASAL AND NASALSEPTAL  FRACTURES (N/A Nose)     Patient location during evaluation: PACU Anesthesia Type: General Level of consciousness: patient remains intubated per anesthesia plan Pain management: pain level controlled Vital Signs Assessment: post-procedure vital signs reviewed and stable Respiratory status: patient remains intubated per anesthesia plan Cardiovascular status: stable Postop Assessment: no apparent nausea or vomiting Anesthetic complications: no    Last Vitals:  Vitals:   10/29/18 1600 10/29/18 1700  BP: (!) 105/57 (!) 100/56  Pulse: 88 86  Resp: (!) 22 (!) 22  Temp: 37.8 C   SpO2: 98% 97%    Last Pain:  Vitals:   10/29/18 1600  TempSrc: Axillary  PainSc:                  Mone Commisso

## 2018-10-29 NOTE — OR Nursing (Signed)
Intentional packing in nose (1 Merocel epitaxis packing with string attached per nostril). Also removed every other staple from right arm and left groin while patient was under general anesthesia per Lianne Cure, Vascular PA due to patients previous agitation. Discussed this with mother and unit RN prior to coming to OR. Restraints were released for procedure and repositioning and then reapplied before transport back to unit.

## 2018-10-29 NOTE — Progress Notes (Signed)
Patient ID: [name] Hall, male   DOB: Dec 07, 1985, 33 y.o.   MRN: [id number] Follow up - Trauma Critical Care  Patient Details:    [name] Hall is an 33 y.o. male.  Lines/tubes : Airway 7.5 mm (Active)  Secured at (cm) 23 cm 10/29/2018  7:38 AM  Measured From Lips 10/29/2018  7:38 AM  Secured Location Left 10/29/2018  7:38 AM  Secured By [employer] 10/29/2018  7:38 AM  Tube Holder Repositioned Yes 10/29/2018  7:38 AM  Cuff Pressure (cm H2O) 30 cm H2O 10/29/2018  7:38 AM  Site Condition Dry 10/29/2018  7:38 AM     NG/OG Tube Orogastric Center mouth Xray  60 cm (Active)  External Length of Tube (cm) - (if applicable) 56 cm 10/27/2018  8:00 PM  Site Assessment Clean;Dry;Intact 10/28/2018  8:00 PM  Ongoing Placement Verification No change in respiratory status 10/28/2018  8:00 PM  Status Infusing tube feed 10/28/2018  8:00 PM     External Urinary Catheter (Active)  Collection Container Standard drainage bag 10/28/2018  8:00 PM  Intervention Equipment Changed 10/26/2018  8:00 AM  Output (mL) 450 mL 10/29/2018  4:00 AM    Microbiology/Sepsis markers: No results found for this or any previous visit.  Anti-infectives:  Anti-infectives (From admission, onward)   Start     Dose/Rate Route Frequency Ordered Stop   10/29/18 0700  ceFAZolin (ANCEF) IVPB 2g/100 mL premix     2 g 200 mL/hr over 30 Minutes Intravenous To [facility] 10/29/18 0120 10/30/18 0700   10/23/18 0200  ceFAZolin (ANCEF) IVPB 2g/100 mL premix     2 g 200 mL/hr over 30 Minutes Intravenous Every 8 hours 10/22/18 2256 10/23/18 1118      Best Practice/Protocols:  VTE Prophylaxis: Lovenox (prophylaxtic dose) Continous Sedation  Consults: Treatment Team:  Flo Shanks, MD Chuck Hint, MD Yolonda Kida, MD    Studies:    Events:  Subjective:    Overnight Issues:   Objective:  Vital signs for last 24 hours: Temp:  [99 F (37.2 C)-101.7 F (38.7 C)] 101.7 F (38.7 C) (01/06  0400) Pulse Rate:  [85-127] 110 (01/06 0800) Resp:  [18-22] 22 (01/06 0800) BP: (108-160)/(54-94) 122/61 (01/06 0800) SpO2:  [88 %-100 %] 96 % (01/06 0800) FiO2 (%):  [40 %-100 %] 40 % (01/06 0800)  Hemodynamic parameters for last 24 hours:    Intake/Output from previous day: 01/05 0701 - 01/06 0700 In: 3268 [I.V.:2708; NG/GT:560] Out: 2950 [Urine:2950]  Intake/Output this shift: Total I/O In: 196.4 [I.V.:196.4] Out: -   Vent settings for last 24 hours: Vent Mode: PRVC FiO2 (%):  [40 %-100 %] 40 % Set Rate:  [22 bmp] 22 bmp Vt Set:  [520 mL] 520 mL PEEP:  [5 cmH20] 5 cmH20 Plateau Pressure:  [17 cmH20-22 cmH20] 20 cmH20  Physical Exam:  General: on vent Neuro: sedated HEENT/Neck: ETT and collar, nasal dressing Resp: clear to auscultation bilaterally CVS: RRR GI: soft, NT, +BS Extremities: ecchymoses R foot - RN fixing buddy tape  Results for orders placed or performed during the hospital encounter of 10/22/18 (from the past 24 hour(s))  Glucose, capillary     Status: None   Collection Time: 10/28/18 11:58 AM  Result Value Ref Range   Glucose-Capillary 98 70 - 99 mg/dL   Comment 1 Notify RN    Comment 2 Document in Chart   Glucose, capillary     Status: None   Collection Time: 10/28/18  3:58 PM  Result Value Ref Range   Glucose-Capillary 94 70 - 99 mg/dL   Comment 1 Notify RN    Comment 2 Document in Chart   Glucose, capillary     Status: Abnormal   Collection Time: 10/28/18  7:31 PM  Result Value Ref Range   Glucose-Capillary 100 (H) 70 - 99 mg/dL  Glucose, capillary     Status: Abnormal   Collection Time: 10/28/18 11:10 PM  Result Value Ref Range   Glucose-Capillary 123 (H) 70 - 99 mg/dL  Triglycerides     Status: Abnormal   Collection Time: 10/28/18 11:12 PM  Result Value Ref Range   Triglycerides 157 (H) <150 mg/dL  Glucose, capillary     Status: Abnormal   Collection Time: 10/29/18  3:44 AM  Result Value Ref Range   Glucose-Capillary 116 (H) 70 - 99  mg/dL  Basic metabolic panel     Status: Abnormal   Collection Time: 10/29/18  4:48 AM  Result Value Ref Range   Sodium 146 (H) 135 - 145 mmol/L   Potassium 3.7 3.5 - 5.1 mmol/L   Chloride 112 (H) 98 - 111 mmol/L   CO2 23 22 - 32 mmol/L   Glucose, Bld 97 70 - 99 mg/dL   BUN 13 6 - 20 mg/dL   Creatinine, Ser 6.80 0.61 - 1.24 mg/dL   Calcium 8.6 (L) 8.9 - 10.3 mg/dL   GFR calc non Af Amer >60 >60 mL/min   GFR calc Af Amer >60 >60 mL/min   Anion gap 11 5 - 15  CBC     Status: Abnormal   Collection Time: 10/29/18  4:48 AM  Result Value Ref Range   WBC 6.7 4.0 - 10.5 K/uL   RBC 3.50 (L) 4.22 - 5.81 MIL/uL   Hemoglobin 10.1 (L) 13.0 - 17.0 g/dL   HCT 88.1 (L) 10.3 - 15.9 %   MCV 95.1 80.0 - 100.0 fL   MCH 28.9 26.0 - 34.0 pg   MCHC 30.3 30.0 - 36.0 g/dL   RDW 45.8 59.2 - 92.4 %   Platelets 261 150 - 400 K/uL   nRBC 0.4 (H) 0.0 - 0.2 %  Magnesium     Status: None   Collection Time: 10/29/18  4:48 AM  Result Value Ref Range   Magnesium 2.1 1.7 - 2.4 mg/dL  Glucose, capillary     Status: None   Collection Time: 10/29/18  7:41 AM  Result Value Ref Range   Glucose-Capillary 81 70 - 99 mg/dL   Comment 1 Notify RN    Comment 2 Document in Chart     Assessment & Plan: Present on Admission: . 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 Injury of brachial artery, right, initial encounter    LOS: 7 days   Additional comments:I reviewed the patient's new clinical lab test results. . [facility] Acute hypoxic ventilator dependent respiratory failure - resume weaning after surgery today L PTX - chest tube out R brachial artery injury - S/P repair by Dr. Durwin Nora 12/30 TBI/post-concussive SZ - appreciate Neurology F/U, Keppra for 7d, MR neg. Plan PT/OT/ST once off vent R MT 1-3 FXs - buddy tape per Dr. Aundria Rud B zygoma, max sinus, orbit FXs, nasal FX - surgery today for CR nasal FX by Dr. Lazarus Salines Acute urinary retention - start urecholine, I&O FEN - TF help for OR, emesis last night x 1 so hold TF  until tomorrow VTE - Lovenox Dispo - ICU I spoke with his mother at  the bedside Critical Care Total Time*: 35 Minutes  Violeta GelinasBurke Ioannis Schuh, MD, MPH, FACS Trauma: [phone] General Surgery: [phone]  10/29/2018  *Care during the described time interval was provided by me. I have reviewed this patient's available data, including medical history, events of note, physical examination and test results as part of my evaluation.

## 2018-10-29 NOTE — Anesthesia Preprocedure Evaluation (Signed)
Anesthesia Evaluation  Patient identified by MRN, date of birth, ID band Patient unresponsive    Reviewed: Allergy & Precautions, NPO status Preop documentation limited or incomplete due to emergent nature of procedure.  Airway Mallampati: Intubated       Dental   Pulmonary asthma , Current Smoker,    breath sounds clear to auscultation       Cardiovascular  Rhythm:Regular Rate:Normal     Neuro/Psych    GI/Hepatic   Endo/Other    Renal/GU      Musculoskeletal   Abdominal   Peds  Hematology   Anesthesia Other Findings   Reproductive/Obstetrics                             Anesthesia Physical Anesthesia Plan  ASA: IV  Anesthesia Plan: General   Post-op Pain Management:    Induction: Intravenous  PONV Risk Score and Plan: Treatment may vary due to age or medical condition  Airway Management Planned: Oral ETT  Additional Equipment:   Intra-op Plan:   Post-operative Plan: Post-operative intubation/ventilation  Informed Consent: I have reviewed the patients History and Physical, chart, labs and discussed the procedure including the risks, benefits and alternatives for the proposed anesthesia with the patient or authorized representative who has indicated his/her understanding and acceptance.   Dental advisory given  Plan Discussed with: CRNA, Anesthesiologist and Surgeon  Anesthesia Plan Comments:         Anesthesia Quick Evaluation

## 2018-10-29 NOTE — Progress Notes (Signed)
CSW has reviewed chart and notes, patient is still on a vent. CSW will continue to follow patient. Still awaiting physical therapy and occupational recommendations, those have not been completed due to the patient not being stable.   CSW will continue to follow.   Drucilla Schmidt, MSW, LCSW-A Clinical Social Worker [employer]

## 2018-10-30 ENCOUNTER — Encounter (HOSPITAL_COMMUNITY): Payer: Self-pay | Admitting: Otolaryngology

## 2018-10-30 ENCOUNTER — Inpatient Hospital Stay (HOSPITAL_COMMUNITY): Payer: BLUE CROSS/BLUE SHIELD

## 2018-10-30 LAB — CBC
HCT: 27.9 % — ABNORMAL LOW (ref 39.0–52.0)
Hemoglobin: 8.5 g/dL — ABNORMAL LOW (ref 13.0–17.0)
MCH: 28.1 pg (ref 26.0–34.0)
MCHC: 30.5 g/dL (ref 30.0–36.0)
MCV: 92.1 fL (ref 80.0–100.0)
PLATELETS: 253 10*3/uL (ref 150–400)
RBC: 3.03 MIL/uL — ABNORMAL LOW (ref 4.22–5.81)
RDW: 13.3 % (ref 11.5–15.5)
WBC: 7.7 10*3/uL (ref 4.0–10.5)

## 2018-10-30 LAB — GLUCOSE, CAPILLARY
GLUCOSE-CAPILLARY: 97 mg/dL (ref 70–99)
Glucose-Capillary: 122 mg/dL — ABNORMAL HIGH (ref 70–99)
Glucose-Capillary: 90 mg/dL (ref 70–99)
Glucose-Capillary: 119 mg/dL — ABNORMAL HIGH (ref 70–99)
Glucose-Capillary: 113 mg/dL — ABNORMAL HIGH (ref 70–99)
Glucose-Capillary: 104 mg/dL — ABNORMAL HIGH (ref 70–99)
Glucose-Capillary: 101 mg/dL — ABNORMAL HIGH (ref 70–99)

## 2018-10-30 MED ORDER — CLONAZEPAM 0.5 MG PO TABS
1.0000 mg | ORAL_TABLET | Freq: Two times a day (BID) | ORAL | Status: DC
  Administered 2018-10-30 – 2018-10-31 (×4): 1 mg
  Filled 2018-10-30 (×4): qty 2

## 2018-10-30 NOTE — Progress Notes (Signed)
OT Cancellation Note  Patient Details Name: [name] Hall MRN: [id number] DOB: [birth year]   Cancelled Treatment:    Reason Eval/Treat Not Completed: Patient not medically ready.   Pt remains on vent. Per trauma, will defer until post extubation.  Jeani Hawking, OTR/L Acute Rehabilitation Services Pager [phone] Office [phone]   Jeani Hawking M 10/30/2018, 10:10 AM

## 2018-10-30 NOTE — Progress Notes (Addendum)
[facility]  Subjective  - Intubated moving all 4 ext.   Objective 107/64 78 98.8 F (37.1 C) (Oral) (!) 22 97%  Intake/Output Summary (Last 24 hours) at 10/30/2018 0740 Last data filed at 10/30/2018 0500 Gross per 24 hour  Intake 2613 ml  Output 2900 ml  Net -287 ml    Right UE incision healing well, compartments soft.  Arm warm to touch. Left medial thigh incision mild ecchymosis, healing well   Assessment/Planning: POD # 8 7 REPAIR OF RIGHT BRACHIAL ARTERY WITH INTERPOSITION VEIN GRAFT (LEFT GREAT SAPHENOUS VEIN)  Patent arterial flow to the right UE Will remove the remainder of the staples in the near future per Dr. Adele Dan instructions.  Mosetta Pigeon 10/30/2018 7:40 AM -- I have interviewed the patient and examined the patient. I agree with the findings by the PA. I think that the rest of the staples can come out Friday.   Cari Caraway, MD [phone]   Laboratory Lab Results: Recent Labs    10/28/18 0308 10/29/18 0448  WBC 7.4 6.7  HGB 9.6* 10.1*  HCT 29.7* 33.3*  PLT 192 261   BMET Recent Labs    10/28/18 0308 10/29/18 0448  NA 142 146*  K 3.4* 3.7  CL 109 112*  CO2 27 23  GLUCOSE 136* 97  BUN 9 13  CREATININE 0.67 0.80  CALCIUM 8.1* 8.6*    COAG Lab Results  Component Value Date   INR 1.22 10/22/2018   No results found for: PTT

## 2018-10-30 NOTE — Progress Notes (Signed)
Follow up - Trauma Critical Care  Patient Details:    [name] Hall is an 33 y.o. male.  Lines/tubes : Airway 7.5 mm (Active)  Secured at (cm) 23 cm 10/30/2018  8:13 AM  Measured From Lips 10/30/2018  8:13 AM  Secured Location Center 10/30/2018  8:13 AM  Secured By [employer] Tube Holder 10/30/2018  8:13 AM  Tube Holder Repositioned Yes 10/30/2018  8:13 AM  Cuff Pressure (cm H2O) 29 cm H2O 10/30/2018  8:13 AM  Site Condition Dry 10/30/2018  8:13 AM     NG/OG Tube Orogastric Center mouth Xray  60 cm (Active)  External Length of Tube (cm) - (if applicable) 60 cm 10/29/2018  8:00 AM  Site Assessment Clean;Dry;Intact 10/29/2018  8:00 PM  Ongoing Placement Verification No change in respiratory status;No acute changes, not attributed to clinical condition 10/29/2018  8:00 PM  Status Clamped 10/29/2018  8:00 PM     External Urinary Catheter (Active)  Collection Container Standard drainage bag 10/29/2018  8:00 PM  Securement Method Tape 10/29/2018  8:00 PM  Intervention Equipment Changed 10/26/2018  8:00 AM  Output (mL) 900 mL 10/30/2018  5:00 AM    Microbiology/Sepsis markers: No results found for this or any previous visit.  Anti-infectives:  Anti-infectives (From admission, onward)   Start     Dose/Rate Route Frequency Ordered Stop   10/29/18 0700  ceFAZolin (ANCEF) IVPB 2g/100 mL premix  Status:  Discontinued     2 g 200 mL/hr over 30 Minutes Intravenous To ShortStay Surgical 10/29/18 0120 10/29/18 1332   10/23/18 0200  ceFAZolin (ANCEF) IVPB 2g/100 mL premix     2 g 200 mL/hr over 30 Minutes Intravenous Every 8 hours 10/22/18 2256 10/23/18 1118      Best Practice/Protocols:  VTE Prophylaxis: Lovenox (prophylaxtic dose) Continous Sedation  Consults: Treatment Team:  Flo ShanksWolicki, Karol, MD Chuck Hintickson, [name] S, MD Yolonda Kidaogers, Jason Patrick, MD   Subjective:    Overnight Issues:   Objective:  Vital signs for last 24 hours: Temp:  [98.8 F (37.1 C)-100.4 F (38 C)] 98.8 F (37.1 C) (01/07  0400) Pulse Rate:  [76-94] 94 (01/07 0813) Resp:  [7-22] 22 (01/07 0813) BP: (94-115)/(53-80) 115/69 (01/07 0800) SpO2:  [93 %-100 %] 95 % (01/07 0813) FiO2 (%):  [40 %] 40 % (01/07 0813) Weight:  [87.8 kg] 87.8 kg (01/07 0500)  Hemodynamic parameters for last 24 hours:    Intake/Output from previous day: 01/06 0701 - 01/07 0700 In: 2613 [I.V.:2613] Out: 2900 [Urine:2900]  Intake/Output this shift: No intake/output data recorded.  Vent settings for last 24 hours: Vent Mode: PRVC FiO2 (%):  [40 %] 40 % Set Rate:  [22 bmp] 22 bmp Vt Set:  [520 mL] 520 mL PEEP:  [5 cmH20] 5 cmH20 Plateau Pressure:  [17 cmH20-20 cmH20] 19 cmH20  Physical Exam:  General: on vent Neuro: arouses and MAE, did not open eyes, PERL HEENT/Neck: ETT and nasal splint Resp: clear to auscultation bilaterally CVS: RRR GI: soft, NT, ND Extremities: dressing RUE, red pulse  Results for orders placed or performed during the hospital encounter of 10/22/18 (from the past 24 hour(s))  Glucose, capillary     Status: None   Collection Time: 10/29/18 11:28 AM  Result Value Ref Range   Glucose-Capillary 93 70 - 99 mg/dL   Comment 1 Notify RN    Comment 2 Document in Chart   Glucose, capillary     Status: Abnormal   Collection Time: 10/29/18  3:48 PM  Result Value Ref Range   Glucose-Capillary 104 (H) 70 - 99 mg/dL   Comment 1 Notify RN    Comment 2 Document in Chart   Glucose, capillary     Status: None   Collection Time: 10/29/18  7:44 PM  Result Value Ref Range   Glucose-Capillary 96 70 - 99 mg/dL  Glucose, capillary     Status: None   Collection Time: 10/29/18 11:22 PM  Result Value Ref Range   Glucose-Capillary 97 70 - 99 mg/dL  Glucose, capillary     Status: Abnormal   Collection Time: 10/30/18  3:18 AM  Result Value Ref Range   Glucose-Capillary 122 (H) 70 - 99 mg/dL  CBC     Status: Abnormal   Collection Time: 10/30/18  6:05 AM  Result Value Ref Range   WBC 7.7 4.0 - 10.5 K/uL   RBC 3.03  (L) 4.22 - 5.81 MIL/uL   Hemoglobin 8.5 (L) 13.0 - 17.0 g/dL   HCT 64.1 (L) 58.3 - 09.4 %   MCV 92.1 80.0 - 100.0 fL   MCH 28.1 26.0 - 34.0 pg   MCHC 30.5 30.0 - 36.0 g/dL   RDW 07.6 80.8 - 81.1 %   Platelets 253 150 - 400 K/uL  Glucose, capillary     Status: None   Collection Time: 10/30/18  7:57 AM  Result Value Ref Range   Glucose-Capillary 90 70 - 99 mg/dL   Comment 1 Notify RN    Comment 2 Document in Chart     Assessment & Plan: Present on Admission: . 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 Injury of brachial artery, right, initial encounter    LOS: 8 days   Additional comments:I reviewed the patient's new clinical lab test results. and CXR [facility] Acute hypoxic ventilator dependent respiratory failure - tried wean and had poor effort then was agitated - add Klonopin and work as able today L PTX - chest tube out, no PTX R brachial artery injury - S/P repair by Dr. Durwin Nora 12/30 TBI/post-concussive SZ - appreciate Neurology F/U, Keppra for 7d, MR neg. Plan PT/OT/ST once off vent R MT 1-3 FXs - buddy tape per Dr. Aundria Rud B zygoma, max sinus, orbit FXs, nasal FX - S/P CR nasal FX by Dr. Lazarus Salines 1/6 FEN - resume TF, Klon/sero VTE - Lovenox Dispo - ICU I spoke with his mother at the bedside Critical Care Total Time*: 37 Minutes  [name], MD, MPH, FACS Trauma: [phone] General Surgery: [phone]  10/30/2018  *Care during the described time interval was provided by me. I have reviewed this patient's available data, including medical history, events of note, physical examination and test results as part of my evaluation.  Patient ID: [name] Hall, male   DOB: 1986/07/14, 33 y.o.   MRN: [id number]

## 2018-10-30 NOTE — Progress Notes (Signed)
PT Cancellation Note  Patient Details Name: [name] Hall MRN: [id number] DOB: January 23, 1986   Cancelled Treatment:    Reason Eval/Treat Not Completed: Patient not medically ready(pt remains on vent and per trauma will eval post extubation)   [name] [name] 10/30/2018, 7:15 AM  [name], PT Acute Rehabilitation Services Pager: [phone] Office: [phone]

## 2018-10-31 LAB — URINALYSIS, COMPLETE (UACMP) WITH MICROSCOPIC
BILIRUBIN URINE: NEGATIVE
Bacteria, UA: NONE SEEN
Glucose, UA: NEGATIVE mg/dL
Hgb urine dipstick: NEGATIVE
KETONES UR: NEGATIVE mg/dL
LEUKOCYTES UA: NEGATIVE
Nitrite: NEGATIVE
Protein, ur: NEGATIVE mg/dL
Specific Gravity, Urine: 1.016 (ref 1.005–1.030)
pH: 6 (ref 5.0–8.0)

## 2018-10-31 LAB — BASIC METABOLIC PANEL
Anion gap: 8 (ref 5–15)
BUN: 14 mg/dL (ref 6–20)
CO2: 28 mmol/L (ref 22–32)
Calcium: 8 mg/dL — ABNORMAL LOW (ref 8.9–10.3)
Chloride: 107 mmol/L (ref 98–111)
Creatinine, Ser: 0.79 mg/dL (ref 0.61–1.24)
GFR calc Af Amer: >60 mL/min (ref >60)
GFR calc non Af Amer: >60 mL/min (ref >60)
Glucose, Bld: 97 mg/dL (ref 70–99)
Potassium: 3.4 mmol/L — ABNORMAL LOW (ref 3.5–5.1)
Sodium: 143 mmol/L (ref 135–145)

## 2018-10-31 LAB — CBC
HCT: 26.6 % — ABNORMAL LOW (ref 39.0–52.0)
Hemoglobin: 8.5 g/dL — ABNORMAL LOW (ref 13.0–17.0)
MCH: 29.3 pg (ref 26.0–34.0)
MCHC: 32 g/dL (ref 30.0–36.0)
MCV: 91.7 fL (ref 80.0–100.0)
Platelets: 263 10*3/uL (ref 150–400)
RBC: 2.9 MIL/uL — ABNORMAL LOW (ref 4.22–5.81)
RDW: 13.2 % (ref 11.5–15.5)
WBC: 6.1 10*3/uL (ref 4.0–10.5)
nRBC: 0 % (ref 0.0–0.2)

## 2018-10-31 LAB — GLUCOSE, CAPILLARY
GLUCOSE-CAPILLARY: 119 mg/dL — AB (ref 70–99)
Glucose-Capillary: 102 mg/dL — ABNORMAL HIGH (ref 70–99)
Glucose-Capillary: 112 mg/dL — ABNORMAL HIGH (ref 70–99)
Glucose-Capillary: 124 mg/dL — ABNORMAL HIGH (ref 70–99)
Glucose-Capillary: 86 mg/dL (ref 70–99)
Glucose-Capillary: 98 mg/dL (ref 70–99)

## 2018-10-31 LAB — TRIGLYCERIDES: Triglycerides: 154 mg/dL — ABNORMAL HIGH (ref <150)

## 2018-10-31 MED ORDER — VANCOMYCIN HCL 10 G IV SOLR
1250.0000 mg | Freq: Three times a day (TID) | INTRAVENOUS | Status: DC
  Administered 2018-10-31 – 2018-11-02 (×5): 1250 mg via INTRAVENOUS
  Filled 2018-10-31 (×6): qty 1250

## 2018-10-31 MED ORDER — VANCOMYCIN HCL 10 G IV SOLR
1500.0000 mg | Freq: Once | INTRAVENOUS | Status: AC
  Administered 2018-10-31: 1500 mg via INTRAVENOUS
  Filled 2018-10-31: qty 1500

## 2018-10-31 MED ORDER — SODIUM CHLORIDE 0.9 % IV SOLN
2.0000 g | Freq: Two times a day (BID) | INTRAVENOUS | Status: DC
  Administered 2018-10-31 – 2018-11-02 (×4): 2 g via INTRAVENOUS
  Filled 2018-10-31 (×5): qty 2

## 2018-10-31 MED ORDER — MIDAZOLAM 50MG/50ML (1MG/ML) PREMIX INFUSION
0.5000 mg/h | INTRAVENOUS | Status: DC
  Administered 2018-11-01: 0.5 mg/h via INTRAVENOUS
  Administered 2018-11-01: 3 mg/h via INTRAVENOUS
  Administered 2018-11-02 (×2): 5 mg/h via INTRAVENOUS
  Administered 2018-11-03: 4 mg/h via INTRAVENOUS
  Filled 2018-10-31 (×5): qty 50

## 2018-10-31 NOTE — Progress Notes (Signed)
[facility]  Subjective  - intubated, but moving all 4 ext, responds with facial expressions somewhat.   Objective 107/61 82 100 F (37.8 C) (Axillary) (!) 22 99%  Intake/Output Summary (Last 24 hours) at 10/31/2018 0813 Last data filed at 10/31/2018 0700 Gross per 24 hour  Intake 2929.01 ml  Output 3775 ml  Net -845.99 ml   Right UE incision healing well, left thigh healing well at vein harvest sight. Right UE warm and well perfused   Assessment/Planning: POD #  REPAIR OF RIGHT BRACHIAL ARTERY WITH INTERPOSITION VEIN GRAFT (LEFT GREAT SAPHENOUS VEIN)  Plan to remove remaining staples Friday   Mosetta Pigeon 10/31/2018 8:13 AM --  Laboratory Lab Results: Recent Labs    10/30/18 0605 10/31/18 0554  WBC 7.7 6.1  HGB 8.5* 8.5*  HCT 27.9* 26.6*  PLT 253 263   BMET Recent Labs    10/29/18 0448 10/31/18 0554  NA 146* 143  K 3.7 3.4*  CL 112* 107  CO2 23 28  GLUCOSE 97 97  BUN 13 14  CREATININE 0.80 0.79  CALCIUM 8.6* 8.0*    COAG Lab Results  Component Value Date   INR 1.22 10/22/2018   No results found for: PTT

## 2018-10-31 NOTE — Progress Notes (Signed)
PT/OT Cancellation Note  Patient Details Name: ANDUIN KOPITZKE MRN: [id number] DOB: 1986-05-03   Cancelled Treatment:    Reason Eval/Treat Not Completed: Other (comment).  Pt restless/agitated, being sedated and put into restraints per OT.  Thanks,   Rollene Rotunda. Yarisbel Miranda, PT, DPT  Acute Rehabilitation [phone] pager [phone]) [phone] office     Lurena Joiner B Nickcole Bralley 10/31/2018, 10:02 AM
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
## 2018-11-01 LAB — GLUCOSE, CAPILLARY
GLUCOSE-CAPILLARY: 100 mg/dL — AB (ref 70–99)
GLUCOSE-CAPILLARY: 93 mg/dL (ref 70–99)
Glucose-Capillary: 106 mg/dL — ABNORMAL HIGH (ref 70–99)
Glucose-Capillary: 85 mg/dL (ref 70–99)
Glucose-Capillary: 91 mg/dL (ref 70–99)
Glucose-Capillary: 87 mg/dL (ref 70–99)

## 2018-11-01 LAB — URINE CULTURE: Culture: <10000 — AB

## 2018-11-01 MED ORDER — CLONAZEPAM 1 MG PO TABS
2.0000 mg | ORAL_TABLET | Freq: Two times a day (BID) | ORAL | Status: DC
  Administered 2018-11-01 – 2018-11-03 (×4): 2 mg
  Filled 2018-11-01 (×5): qty 2

## 2018-11-01 MED ORDER — PROPOFOL 1000 MG/100ML IV EMUL: 0.0000 ug/kg/min | INTRAVENOUS | Status: DC

## 2018-11-01 MED ORDER — PROPOFOL 1000 MG/100ML IV EMUL
0.0000 ug/kg/min | INTRAVENOUS | Status: DC
  Administered 2018-11-01 (×3): 40 ug/kg/min via INTRAVENOUS
  Administered 2018-11-02: 45 ug/kg/min via INTRAVENOUS
  Filled 2018-11-01 (×4): qty 100

## 2018-11-01 MED ORDER — QUETIAPINE FUMARATE 200 MG PO TABS
200.0000 mg | ORAL_TABLET | Freq: Two times a day (BID) | ORAL | Status: DC
  Administered 2018-11-01 – 2018-11-06 (×9): 200 mg via ORAL
  Filled 2018-11-01 (×9): qty 1

## 2018-11-01 MED ORDER — INSULIN ASPART 100 UNIT/ML ~~LOC~~ SOLN: 0.0000 [IU] | SUBCUTANEOUS | Status: DC
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
## 2018-11-01 NOTE — Progress Notes (Signed)
PT Cancellation Note  Patient Details Name: [name] Hall AreaJoshua W Brandle MRN: [id number] DOB: 12/13/1985   Cancelled Treatment:    Reason Eval/Treat Not Completed: Medical issues which prohibited therapy(pt remains on vent and not medically ready. Will sign off and please reorder when pt status appropriate for therapy)   Mishell Donalson B Lashawn Orrego 11/01/2018, 9:55 AM  Delaney MeigsMaija Tabor Antino Mayabb, PT Acute Rehabilitation Services Pager: [phone] Office: [phone]

## 2018-11-02 ENCOUNTER — Inpatient Hospital Stay (HOSPITAL_COMMUNITY): Payer: BLUE CROSS/BLUE SHIELD

## 2018-11-02 LAB — CBC
HCT: 28.7 % — ABNORMAL LOW (ref 39.0–52.0)
Hemoglobin: 8.8 g/dL — ABNORMAL LOW (ref 13.0–17.0)
MCH: 27.8 pg (ref 26.0–34.0)
MCHC: 30.7 g/dL (ref 30.0–36.0)
MCV: 90.5 fL (ref 80.0–100.0)
NRBC: 0 % (ref 0.0–0.2)
PLATELETS: 326 10*3/uL (ref 150–400)
RBC: 3.17 MIL/uL — ABNORMAL LOW (ref 4.22–5.81)
RDW: 12.7 % (ref 11.5–15.5)
WBC: 7.1 10*3/uL (ref 4.0–10.5)

## 2018-11-02 LAB — BASIC METABOLIC PANEL
Anion gap: 8 (ref 5–15)
BUN: 14 mg/dL (ref 6–20)
CALCIUM: 8 mg/dL — AB (ref 8.9–10.3)
CO2: 28 mmol/L (ref 22–32)
Chloride: 104 mmol/L (ref 98–111)
Creatinine, Ser: 0.69 mg/dL (ref 0.61–1.24)
GFR calc Af Amer: >60 mL/min (ref >60)
GFR calc non Af Amer: >60 mL/min (ref >60)
Glucose, Bld: 116 mg/dL — ABNORMAL HIGH (ref 70–99)
Potassium: 3.8 mmol/L (ref 3.5–5.1)
Sodium: 140 mmol/L (ref 135–145)

## 2018-11-02 LAB — GLUCOSE, CAPILLARY
Glucose-Capillary: 101 mg/dL — ABNORMAL HIGH (ref 70–99)
Glucose-Capillary: 108 mg/dL — ABNORMAL HIGH (ref 70–99)
Glucose-Capillary: 113 mg/dL — ABNORMAL HIGH (ref 70–99)
Glucose-Capillary: 117 mg/dL — ABNORMAL HIGH (ref 70–99)
Glucose-Capillary: 86 mg/dL (ref 70–99)
Glucose-Capillary: 94 mg/dL (ref 70–99)

## 2018-11-02 LAB — CULTURE, RESPIRATORY W GRAM STAIN

## 2018-11-02 MED ORDER — IPRATROPIUM-ALBUTEROL 0.5-2.5 (3) MG/3ML IN SOLN
RESPIRATORY_TRACT | Status: AC
  Filled 2018-11-02: qty 3

## 2018-11-02 MED ORDER — FENTANYL CITRATE (PF) 100 MCG/2ML IJ SOLN: 50.0000 ug | INTRAMUSCULAR | Status: DC | PRN

## 2018-11-02 MED ORDER — DEXMEDETOMIDINE HCL IN NACL 200 MCG/50ML IV SOLN
0.4000 ug/kg/h | INTRAVENOUS | Status: DC
  Administered 2018-11-02 (×3): 1.2 ug/kg/h via INTRAVENOUS
  Administered 2018-11-02: 0.6 ug/kg/h via INTRAVENOUS
  Administered 2018-11-02 (×2): 1.2 ug/kg/h via INTRAVENOUS
  Administered 2018-11-03 (×2): 1 ug/kg/h via INTRAVENOUS
  Administered 2018-11-03 (×3): 1.2 ug/kg/h via INTRAVENOUS
  Filled 2018-11-02 (×8): qty 50

## 2018-11-02 MED ORDER — CEFAZOLIN SODIUM-DEXTROSE 2-4 GM/100ML-% IV SOLN
2.0000 g | Freq: Three times a day (TID) | INTRAVENOUS | Status: DC
  Administered 2018-11-02 – 2018-11-06 (×13): 2 g via INTRAVENOUS
  Filled 2018-11-02 (×14): qty 100

## 2018-11-02 MED ORDER — IPRATROPIUM-ALBUTEROL 0.5-2.5 (3) MG/3ML IN SOLN
3.0000 mL | Freq: Four times a day (QID) | RESPIRATORY_TRACT | Status: DC
  Administered 2018-11-02 – 2018-11-04 (×8): 3 mL via RESPIRATORY_TRACT
  Filled 2018-11-02 (×10): qty 3

## 2018-11-02 MED ORDER — PANTOPRAZOLE SODIUM 40 MG IV SOLR
40.0000 mg | Freq: Every day | INTRAVENOUS | Status: DC
  Administered 2018-11-02 – 2018-11-05 (×4): 40 mg via INTRAVENOUS
  Filled 2018-11-02 (×4): qty 40

## 2018-11-02 NOTE — Progress Notes (Addendum)
[facility]  Subjective  - Alert and moving all 4 ext.   Objective (!) 121/58 90 99.5 F (37.5 C) (Axillary) 15 97%  Intake/Output Summary (Last 24 hours) at 11/02/2018 0953 Last data filed at 11/02/2018 0700 Gross per 24 hour  Intake 3780.94 ml  Output 5075 ml  Net -1294.06 ml    Right UE incision healing well, will take remaining staples out today. Grip intact and sensation intact with palpable radial pulse right UE Left thigh incision healing well.  Assessment/Planning: REPAIR OF RIGHT BRACHIAL ARTERY WITH INTERPOSITION VEIN GRAFT (LEFT GREAT SAPHENOUS VEIN)   Order placed for RN to take out remaining staples in right UE and left medial thigh.  Mosetta Pigeon 11/02/2018 9:53 AM -- I have interviewed the patient and examined the patient. I agree with the findings by the PA.  Cari Caraway, MD [phone]   Laboratory Lab Results: Recent Labs    10/31/18 0554 11/02/18 0716  WBC 6.1 7.1  HGB 8.5* 8.8*  HCT 26.6* 28.7*  PLT 263 326   BMET Recent Labs    10/31/18 0554 11/02/18 0716  NA 143 140  K 3.4* 3.8  CL 107 104  CO2 28 28  GLUCOSE 97 116*  BUN 14 14  CREATININE 0.79 0.69  CALCIUM 8.0* 8.0*    COAG Lab Results  Component Value Date   INR 1.22 10/22/2018   No results found for: PTT
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
## 2018-11-02 NOTE — Progress Notes (Signed)
Chaplain attempted visit with patient. However, pt's mother was in hallway and said she was trying to give him space at this time. She was smiling, saying she was having a better afternoon.  Her son extubated spoke this morning, with confusion. She said "he says he's allergic to cats." Chaplain will continue to be available for family. Lynnell Chad Pager [phone]

## 2018-11-02 NOTE — Progress Notes (Signed)
Staples removed from RUE and LLE per MD order

## 2018-11-02 NOTE — Procedures (Signed)
Extubation Procedure Note  Patient Details:   Name: [name] Hall DOB: 1986-09-12 MRN: [id number]   Airway Documentation:    Vent end date: 11/02/18 Vent end time: 0956   Evaluation  O2 sats: transiently fell during during procedure Complications: Complications of desat Patient did tolerate procedure well. Bilateral Breath Sounds: Clear   Yes   Patient extubated per MD order.  Positive cuff leak was noted.  Patient able to speak post extubation.  After extubation, patient's sats began to decrease to low 80s on nasal cannula.  Placed patient on venturi mask and sats would not go above 88%.  Patient began stating he couldn't breath.  Placed patient on non-rebreather mask and sats improved to 92%.  MD came to room to assess patient.  RN gave patient anxiety medication and RT gave Duoneb dose.  Patient respiratory rate and work of breathing began to improve after both.  Sats currently at 97%.  Will continue to monitor and wean oxygen as tolerated.   Elyn Peers 11/02/2018, 10:19 AM

## 2018-11-02 NOTE — Progress Notes (Signed)
OT Cancellation and Discharge Note  Patient Details Name: [name] Hall MRN: [id number] DOB: [birth year]   Cancelled Treatment:    Reason Eval/Treat Not Completed: Patient not medically ready.  Pt continues on vent and not yet ready for therapies.  Will sign off at this time.  Please reorder when medically stable  Jeani Hawking, OTR/L Acute Rehabilitation Services Pager [phone] Office [phone]   Jeani Hawking M 11/02/2018, 5:24 AM

## 2018-11-02 NOTE — Progress Notes (Signed)
RT note: patient placed on CPAP/PSV of 5/5 at 0820 and is currently tolerating well.

## 2018-11-03 ENCOUNTER — Inpatient Hospital Stay (HOSPITAL_COMMUNITY): Payer: BLUE CROSS/BLUE SHIELD

## 2018-11-03 LAB — CBC
HCT: 31.6 % — ABNORMAL LOW (ref 39.0–52.0)
Hemoglobin: 10.2 g/dL — ABNORMAL LOW (ref 13.0–17.0)
MCH: 27.8 pg (ref 26.0–34.0)
MCHC: 32.3 g/dL (ref 30.0–36.0)
MCV: 86.1 fL (ref 80.0–100.0)
Platelets: 384 10*3/uL (ref 150–400)
RBC: 3.67 MIL/uL — AB (ref 4.22–5.81)
RDW: 12.2 % (ref 11.5–15.5)
WBC: 12.1 10*3/uL — ABNORMAL HIGH (ref 4.0–10.5)
nRBC: 0 % (ref 0.0–0.2)

## 2018-11-03 LAB — GLUCOSE, CAPILLARY
GLUCOSE-CAPILLARY: 110 mg/dL — AB (ref 70–99)
Glucose-Capillary: 98 mg/dL (ref 70–99)
Glucose-Capillary: 135 mg/dL — ABNORMAL HIGH (ref 70–99)
Glucose-Capillary: 102 mg/dL — ABNORMAL HIGH (ref 70–99)

## 2018-11-03 LAB — TRIGLYCERIDES: Triglycerides: 210 mg/dL — ABNORMAL HIGH (ref <150)

## 2018-11-03 MED ORDER — ORAL CARE MOUTH RINSE
15.0000 mL | Freq: Two times a day (BID) | OROMUCOSAL | Status: DC
  Administered 2018-11-04 – 2018-11-06 (×5): 15 mL via OROMUCOSAL

## 2018-11-03 MED ORDER — SODIUM CHLORIDE 0.9 % IV SOLN: 0.4000 ug/kg/h | INTRAVENOUS | Status: DC

## 2018-11-03 MED ORDER — BETHANECHOL CHLORIDE 10 MG PO TABS
25.0000 mg | ORAL_TABLET | Freq: Three times a day (TID) | ORAL | Status: DC
  Administered 2018-11-03 – 2018-11-06 (×7): 25 mg via ORAL
  Filled 2018-11-03 (×6): qty 3

## 2018-11-03 MED ORDER — MIDAZOLAM HCL 2 MG/2ML IJ SOLN
2.0000 mg | INTRAMUSCULAR | Status: DC | PRN
  Administered 2018-11-04: 2 mg via INTRAVENOUS
  Filled 2018-11-03 (×2): qty 2

## 2018-11-03 MED ORDER — DEXMEDETOMIDINE HCL IN NACL 400 MCG/100ML IV SOLN
0.4000 ug/kg/h | INTRAVENOUS | Status: DC
  Administered 2018-11-03: 1 ug/kg/h via INTRAVENOUS
  Administered 2018-11-03: 1.1 ug/kg/h via INTRAVENOUS
  Administered 2018-11-03: 1 ug/kg/h via INTRAVENOUS
  Administered 2018-11-04: 0.7 ug/kg/h via INTRAVENOUS
  Administered 2018-11-04: 1.1 ug/kg/h via INTRAVENOUS
  Administered 2018-11-04: 0.7 ug/kg/h via INTRAVENOUS
  Administered 2018-11-05: 0.3 ug/kg/h via INTRAVENOUS
  Administered 2018-11-05: 0.7 ug/kg/h via INTRAVENOUS
  Filled 2018-11-03: qty 200
  Filled 2018-11-03 (×7): qty 100

## 2018-11-03 MED ORDER — CLONAZEPAM 1 MG PO TABS
2.0000 mg | ORAL_TABLET | Freq: Two times a day (BID) | ORAL | Status: DC
  Administered 2018-11-03 – 2018-11-06 (×6): 2 mg via ORAL
  Filled 2018-11-03 (×5): qty 2

## 2018-11-03 MED ORDER — CHLORHEXIDINE GLUCONATE 0.12 % MT SOLN
15.0000 mL | Freq: Two times a day (BID) | OROMUCOSAL | Status: DC
  Administered 2018-11-03 – 2018-11-06 (×6): 15 mL via OROMUCOSAL
  Filled 2018-11-03 (×4): qty 15
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
## 2018-11-03 NOTE — Progress Notes (Signed)
Modified Barium Swallow Progress Note  Patient Details  Name: [name] Hall MRN: [id number] Date of Birth: 1986-07-12  Today's Date: 11/03/2018  Modified Barium Swallow completed.  Full report located under Chart Review in the Imaging Section.  Brief recommendations include the following:  Clinical Impression  Patient had c-collar removed before MBS. He was cooperative for evaluation, but was very impulsive requiring cues for safety and following commands. He presents with mod-severe oropharyngeal dysphagia. Pt had moderate oral residue of all consistencies. He triggered the swallow at the base of tongue however he had reduced anterior laryngeal movement, reduced laryngeal elevation, reduced laryngeal closeure and reduced tongue base retration resulting in penetration and aspiration below the vocal cords of all liquid consistencies and bolus sizes (thin, nectar and honey). He did not have penetration or aspiration with puree. He had an immediate and very strong cough response after each episode of aspiration which seemed to be effective to clear aspirated material. He was able to cough up bolus to mouth and spit out. Recommend NPO status except medication crushed in puree. Speech therapy to follow up for PO readiness, oropharyngeal exercises,    Swallow Evaluation Recommendations       SLP Diet Recommendations: NPO except meds       Medication Administration: Crushed with puree           Postural Changes: Seated upright at 90 degrees   Oral Care Recommendations: Oral care BID        [name], MA, CCC-SLP 11/03/2018 3:09 PM

## 2018-11-03 NOTE — Progress Notes (Signed)
Rehab Admissions Coordinator Note:  Patient was screened by Clois DupesBoyette, Yaretsi Humphres Godwin for appropriateness for an Inpatient Acute Rehab Consult per PT and OT recs.  At this time, we are recommending Inpatient Rehab consult. Please place order for consult.  Clois DupesBoyette, Darryl Willner Godwin RN MSN 11/03/2018, 1:29 PM  I can be reached at [phone].
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
## 2018-11-04 LAB — GLUCOSE, CAPILLARY
Glucose-Capillary: 100 mg/dL — ABNORMAL HIGH (ref 70–99)
Glucose-Capillary: 100 mg/dL — ABNORMAL HIGH (ref 70–99)
Glucose-Capillary: 105 mg/dL — ABNORMAL HIGH (ref 70–99)
Glucose-Capillary: 105 mg/dL — ABNORMAL HIGH (ref 70–99)
Glucose-Capillary: 113 mg/dL — ABNORMAL HIGH (ref 70–99)
Glucose-Capillary: 123 mg/dL — ABNORMAL HIGH (ref 70–99)

## 2018-11-04 NOTE — Progress Notes (Signed)
CSW met with patient to complete SBIRT. CSW noted patient scored a 7 due to daily THC use. CSW provided patient with a brief intervention discussing THC use and report of using it to help sleep. CSW discussed resources for support for symptoms of anxiety/chronic stress. CSW offered resources and noted patient was open to resources for support. CSW provided brief information regarding sleep hygiene.  Lamonte Richer, LCSW, [place] Worker II [phone]
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
## 2018-11-04 NOTE — Progress Notes (Signed)
Orthopedic Tech Progress Note Patient Details:  [name] Hall May 17, 1986 [id number]  Ortho Devices Type of Ortho Device: Postop shoe/boot Ortho Device/Splint Interventions: Application   Post Interventions Patient Tolerated: Well Instructions Provided: Care of device   Saul Fordyce 11/04/2018, 12:37 PM
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
## 2018-11-05 ENCOUNTER — Inpatient Hospital Stay (HOSPITAL_COMMUNITY): Payer: BLUE CROSS/BLUE SHIELD

## 2018-11-05 DIAGNOSIS — J45909 Unspecified asthma, uncomplicated: Secondary | ICD-10-CM

## 2018-11-05 DIAGNOSIS — D62 Acute posthemorrhagic anemia: Secondary | ICD-10-CM

## 2018-11-05 DIAGNOSIS — R561 Post traumatic seizures: Secondary | ICD-10-CM

## 2018-11-05 DIAGNOSIS — S93111A Dislocation of interphalangeal joint of right great toe, initial encounter: Secondary | ICD-10-CM

## 2018-11-05 DIAGNOSIS — D72829 Elevated white blood cell count, unspecified: Secondary | ICD-10-CM

## 2018-11-05 DIAGNOSIS — R0682 Tachypnea, not elsewhere classified: Secondary | ICD-10-CM

## 2018-11-05 DIAGNOSIS — S45101A Unspecified injury of brachial artery, right side, initial encounter: Secondary | ICD-10-CM

## 2018-11-05 LAB — CULTURE, BLOOD (ROUTINE X 2)
Culture: NO GROWTH
Culture: NO GROWTH
Special Requests: ADEQUATE

## 2018-11-05 LAB — GLUCOSE, CAPILLARY
GLUCOSE-CAPILLARY: 94 mg/dL (ref 70–99)
Glucose-Capillary: 97 mg/dL (ref 70–99)
Glucose-Capillary: 102 mg/dL — ABNORMAL HIGH (ref 70–99)
Glucose-Capillary: 98 mg/dL (ref 70–99)
Glucose-Capillary: 92 mg/dL (ref 70–99)
Glucose-Capillary: 106 mg/dL — ABNORMAL HIGH (ref 70–99)

## 2018-11-05 MED ORDER — RESOURCE THICKENUP CLEAR PO POWD: ORAL | Status: DC | PRN

## 2018-11-05 MED ORDER — IPRATROPIUM-ALBUTEROL 0.5-2.5 (3) MG/3ML IN SOLN: 3.0000 mL | RESPIRATORY_TRACT | Status: DC | PRN

## 2018-11-05 MED ORDER — IPRATROPIUM-ALBUTEROL 0.5-2.5 (3) MG/3ML IN SOLN
3.0000 mL | Freq: Three times a day (TID) | RESPIRATORY_TRACT | Status: DC
  Administered 2018-11-05: 3 mL via RESPIRATORY_TRACT
  Filled 2018-11-05 (×2): qty 3

## 2018-11-05 MED ORDER — SALINE SPRAY 0.65 % NA SOLN
2.0000 | NASAL | Status: DC | PRN
  Administered 2018-11-05: 2 via NASAL
  Filled 2018-11-05 (×2): qty 44
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
## 2018-11-05 NOTE — Progress Notes (Signed)
Modified Barium Swallow Progress Note  Patient Details  Name: [name] Hall MRN: [id number] Date of Birth: [birth year]  Today's Date: 11/05/2018  Modified Barium Swallow completed.  Full report located under Chart Review in the Imaging Section.  Brief recommendations include the following:  Clinical Impression  Pt's swallow function has improved mildy from previous MBS although moderate-severe pharyngeal dysphagia persists with (+) penetration and aspiration. Presently, swallow integrity lacks efficient laryngeal closure and epiglottic deflection resulting in significant aspiration during the swallow of nectar thick not cleared with reflexive cough. Honey thick penetrated to vocal cords during the swallow. Compensatory strategies including chin tuck, right head turn and breath hold were not consistently effective with all liquid trials. No significant residue. Prolongated mastication with cracker. Recommend puree (Dys 1) only, pudding thick liquids, pills crushed, cough/throat clear post swallows, slow rate and breath hold to supplement swallow (although not necessary). Pt may have ice chips after oral care with full supervision.   Swallow Evaluation Recommendations       SLP Diet Recommendations: Dysphagia 1 (Puree) solids;Pudding thick liquid       Medication Administration: Crushed with puree   Supervision: Patient able to self feed;Full supervision/cueing for compensatory strategies   Compensations: Minimize environmental distractions;Slow rate;Small sips/bites;Clear throat after each swallow;Other (Comment)(breath hold)   Postural Changes: Seated upright at 90 degrees   Oral Care Recommendations: Oral care BID   Other Recommendations: Order thickener from pharmacy    Royce Macadamia 11/05/2018,5:10 PM

## 2018-11-05 NOTE — Progress Notes (Addendum)
[facility]  Subjective  - Doing better alert and oriented.  Objective (!) 91/50 64 98 F (36.7 C) (Axillary) 19 95%  Intake/Output Summary (Last 24 hours) at 11/05/2018 1009 Last data filed at 11/05/2018 0900 Gross per 24 hour  Intake 1738.13 ml  Output 1926 ml  Net -187.87 ml    Right UE without edema, palpable radial pulse, grip 5/5. Right medial UE incision healing well. Left medial thigh incision healed  Assessment/Planning: REPAIR OF RIGHT BRACHIAL ARTERY WITH INTERPOSITION VEIN GRAFT (LEFT GREAT SAPHENOUS VEIN)  Patent arterial flow to right UE. We will not actively follow at this time. Will arrange follow up as an out patient.  [name] Hall 11/05/2018 10:09 AM -- I have interviewed the patient and examined the patient. I agree with the findings by the PA. All of his sutures are out.  He has a palpable right radial pulse. Vascular surgery will be available as needed.  Cari Caraway, MD [phone]   Laboratory Lab Results: Recent Labs    11/03/18 0548  WBC 12.1*  HGB 10.2*  HCT 31.6*  PLT 384   BMET No results for input(s): NA, K, CL, CO2, GLUCOSE, BUN, CREATININE, CALCIUM in the last 72 hours.  COAG Lab Results  Component Value Date   INR 1.22 10/22/2018   No results found for: PTT
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
## 2018-11-05 NOTE — Progress Notes (Signed)
11/05/2018 1:14 PM  [name], [place] [id number]  Post-Op Day 7    Temp:  [97.8 F (36.6 C)-98.1 F (36.7 C)] 97.8 F (36.6 C) (01/13 1200) Pulse Rate:  [53-89] 81 (01/13 1300) Resp:  [15-29] 19 (01/13 1300) BP: (88-145)/(50-100) 114/93 (01/13 1300) SpO2:  [93 %-100 %] 96 % (01/13 1300) Weight:  [74 kg] 74 kg (01/13 0400),     Intake/Output Summary (Last 24 hours) at 11/05/2018 1314 Last data filed at 11/05/2018 1300 Gross per 24 hour  Intake 1761 ml  Output 1626 ml  Net 135 ml    Results for orders placed or performed during the hospital encounter of 10/22/18 (from the past 24 hour(s))  Glucose, capillary     Status: Abnormal   Collection Time: 11/04/18  7:49 PM  Result Value Ref Range   Glucose-Capillary 100 (H) 70 - 99 mg/dL  Glucose, capillary     Status: Abnormal   Collection Time: 11/04/18 11:38 PM  Result Value Ref Range   Glucose-Capillary 123 (H) 70 - 99 mg/dL  Glucose, capillary     Status: None   Collection Time: 11/05/18  3:27 AM  Result Value Ref Range   Glucose-Capillary 97 70 - 99 mg/dL  Glucose, capillary     Status: Abnormal   Collection Time: 11/05/18  8:05 AM  Result Value Ref Range   Glucose-Capillary 102 (H) 70 - 99 mg/dL  Glucose, capillary     Status: None   Collection Time: 11/05/18 12:06 PM  Result Value Ref Range   Glucose-Capillary 98 70 - 99 mg/dL    SUBJECTIVE:  External nasal splint came off recently.  C/o facial pain.  OBJECTIVE:   Ext nose sl dev RIGHT.  merocel packs very smelly and removed without difficulty.  IMPRESSION:   Satisfactory check  PLAN:   Will institute nasal hygiene measures.  Recheck my office or as IP in 2 weeks please.  Zola ButtonKarol [facility]
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
## 2018-11-06 ENCOUNTER — Encounter (HOSPITAL_COMMUNITY): Payer: Self-pay | Admitting: Emergency Medicine

## 2018-11-06 ENCOUNTER — Telehealth: Payer: Self-pay | Admitting: Vascular Surgery

## 2018-11-06 ENCOUNTER — Encounter (HOSPITAL_COMMUNITY): Payer: Self-pay | Admitting: *Deleted

## 2018-11-06 ENCOUNTER — Other Ambulatory Visit: Payer: Self-pay

## 2018-11-06 ENCOUNTER — Inpatient Hospital Stay (HOSPITAL_COMMUNITY)
Admission: RE | Admit: 2018-11-06 | Discharge: 2018-11-10 | DRG: 946 | Disposition: A | Discharge status: Home / Self Care | Payer: BLUE CROSS/BLUE SHIELD | Source: Intra-hospital | Attending: Physical Medicine & Rehabilitation | Admitting: Physical Medicine & Rehabilitation

## 2018-11-06 DIAGNOSIS — S0292XS Unspecified fracture of facial bones, sequela: Secondary | ICD-10-CM

## 2018-11-06 DIAGNOSIS — S069X1S Unspecified intracranial injury with loss of consciousness of 30 minutes or less, sequela: Secondary | ICD-10-CM

## 2018-11-06 DIAGNOSIS — S069X0S Unspecified intracranial injury without loss of consciousness, sequela: Secondary | ICD-10-CM | POA: Diagnosis not present

## 2018-11-06 DIAGNOSIS — J449 Chronic obstructive pulmonary disease, unspecified: Secondary | ICD-10-CM | POA: Diagnosis present

## 2018-11-06 DIAGNOSIS — Z79899 Other long term (current) drug therapy: Secondary | ICD-10-CM

## 2018-11-06 DIAGNOSIS — S069X2S Unspecified intracranial injury with loss of consciousness of 31 minutes to 59 minutes, sequela: Secondary | ICD-10-CM

## 2018-11-06 DIAGNOSIS — F17200 Nicotine dependence, unspecified, uncomplicated: Secondary | ICD-10-CM | POA: Diagnosis present

## 2018-11-06 DIAGNOSIS — S0285XD Fracture of orbit, unspecified, subsequent encounter for fracture with routine healing: Secondary | ICD-10-CM | POA: Diagnosis not present

## 2018-11-06 DIAGNOSIS — R339 Retention of urine, unspecified: Secondary | ICD-10-CM | POA: Diagnosis present

## 2018-11-06 DIAGNOSIS — S0240DD Maxillary fracture, left side, subsequent encounter for fracture with routine healing: Secondary | ICD-10-CM

## 2018-11-06 DIAGNOSIS — S022XXD Fracture of nasal bones, subsequent encounter for fracture with routine healing: Secondary | ICD-10-CM | POA: Diagnosis not present

## 2018-11-06 DIAGNOSIS — M7989 Other specified soft tissue disorders: Secondary | ICD-10-CM | POA: Diagnosis not present

## 2018-11-06 DIAGNOSIS — R1312 Dysphagia, oropharyngeal phase: Secondary | ICD-10-CM

## 2018-11-06 DIAGNOSIS — Z716 Tobacco abuse counseling: Secondary | ICD-10-CM | POA: Diagnosis not present

## 2018-11-06 DIAGNOSIS — S069XAA Unspecified intracranial injury with loss of consciousness status unknown, initial encounter: Secondary | ICD-10-CM | POA: Diagnosis present

## 2018-11-06 DIAGNOSIS — S060X9D Concussion with loss of consciousness of unspecified duration, subsequent encounter: Secondary | ICD-10-CM | POA: Diagnosis present

## 2018-11-06 DIAGNOSIS — F319 Bipolar disorder, unspecified: Secondary | ICD-10-CM | POA: Diagnosis present

## 2018-11-06 DIAGNOSIS — S45101D Unspecified injury of brachial artery, right side, subsequent encounter: Secondary | ICD-10-CM | POA: Diagnosis not present

## 2018-11-06 DIAGNOSIS — R1310 Dysphagia, unspecified: Secondary | ICD-10-CM | POA: Diagnosis present

## 2018-11-06 DIAGNOSIS — R451 Restlessness and agitation: Secondary | ICD-10-CM | POA: Diagnosis not present

## 2018-11-06 DIAGNOSIS — S270XXD Traumatic pneumothorax, subsequent encounter: Secondary | ICD-10-CM

## 2018-11-06 DIAGNOSIS — S0240FD Zygomatic fracture, left side, subsequent encounter for fracture with routine healing: Secondary | ICD-10-CM | POA: Diagnosis not present

## 2018-11-06 DIAGNOSIS — S069X9A Unspecified intracranial injury with loss of consciousness of unspecified duration, initial encounter: Secondary | ICD-10-CM | POA: Diagnosis present

## 2018-11-06 DIAGNOSIS — R4689 Other symptoms and signs involving appearance and behavior: Secondary | ICD-10-CM | POA: Diagnosis not present

## 2018-11-06 DIAGNOSIS — S0240ED Zygomatic fracture, right side, subsequent encounter for fracture with routine healing: Secondary | ICD-10-CM | POA: Diagnosis not present

## 2018-11-06 DIAGNOSIS — S92321S Displaced fracture of second metatarsal bone, right foot, sequela: Secondary | ICD-10-CM | POA: Diagnosis not present

## 2018-11-06 LAB — CBC
HCT: 37.9 % — ABNORMAL LOW (ref 39.0–52.0)
HCT: 36.9 % — ABNORMAL LOW (ref 39.0–52.0)
Hemoglobin: 12.1 g/dL — ABNORMAL LOW (ref 13.0–17.0)
Hemoglobin: 12 g/dL — ABNORMAL LOW (ref 13.0–17.0)
MCH: 27.5 pg (ref 26.0–34.0)
MCH: 28.3 pg (ref 26.0–34.0)
MCHC: 31.9 g/dL (ref 30.0–36.0)
MCHC: 32.5 g/dL (ref 30.0–36.0)
MCV: 86.1 fL (ref 80.0–100.0)
MCV: 87 fL (ref 80.0–100.0)
PLATELETS: 659 10*3/uL — AB (ref 150–400)
Platelets: 652 10*3/uL — ABNORMAL HIGH (ref 150–400)
RBC: 4.4 MIL/uL (ref 4.22–5.81)
RBC: 4.24 MIL/uL (ref 4.22–5.81)
RDW: 13.1 % (ref 11.5–15.5)
RDW: 13.2 % (ref 11.5–15.5)
WBC: 11 10*3/uL — ABNORMAL HIGH (ref 4.0–10.5)
WBC: 9.9 10*3/uL (ref 4.0–10.5)
nRBC: 0 % (ref 0.0–0.2)
nRBC: 0 % (ref 0.0–0.2)

## 2018-11-06 LAB — BASIC METABOLIC PANEL
Anion gap: 11 (ref 5–15)
BUN: 15 mg/dL (ref 6–20)
CO2: 21 mmol/L — ABNORMAL LOW (ref 22–32)
CREATININE: 0.85 mg/dL (ref 0.61–1.24)
Calcium: 9.3 mg/dL (ref 8.9–10.3)
Chloride: 107 mmol/L (ref 98–111)
GFR calc Af Amer: >60 mL/min (ref >60)
GFR calc non Af Amer: >60 mL/min (ref >60)
Glucose, Bld: 111 mg/dL — ABNORMAL HIGH (ref 70–99)
Potassium: 3.3 mmol/L — ABNORMAL LOW (ref 3.5–5.1)
Sodium: 139 mmol/L (ref 135–145)

## 2018-11-06 LAB — GLUCOSE, CAPILLARY
GLUCOSE-CAPILLARY: 102 mg/dL — AB (ref 70–99)
Glucose-Capillary: 99 mg/dL (ref 70–99)
Glucose-Capillary: 102 mg/dL — ABNORMAL HIGH (ref 70–99)

## 2018-11-06 LAB — CREATININE, SERUM
Creatinine, Ser: 0.69 mg/dL (ref 0.61–1.24)
GFR calc non Af Amer: >60 mL/min (ref >60)

## 2018-11-06 MED ORDER — PANTOPRAZOLE SODIUM 40 MG PO PACK
40.0000 mg | PACK | Freq: Every day | ORAL | Status: DC
  Filled 2018-11-06: qty 20

## 2018-11-06 MED ORDER — SENNOSIDES 8.8 MG/5ML PO SYRP
5.0000 mL | ORAL_SOLUTION | Freq: Two times a day (BID) | ORAL | Status: DC
  Filled 2018-11-06 (×4): qty 5

## 2018-11-06 MED ORDER — ALBUTEROL SULFATE (2.5 MG/3ML) 0.083% IN NEBU
3.0000 mL | INHALATION_SOLUTION | RESPIRATORY_TRACT | Status: DC | PRN
  Administered 2018-11-06: 3 mL via RESPIRATORY_TRACT
  Filled 2018-11-06 (×2): qty 3

## 2018-11-06 MED ORDER — PROSIGHT PO TABS
1.0000 | ORAL_TABLET | Freq: Every day | ORAL | Status: DC
  Administered 2018-11-06: 1 via ORAL
  Filled 2018-11-06: qty 1

## 2018-11-06 MED ORDER — BETHANECHOL CHLORIDE 25 MG PO TABS
25.0000 mg | ORAL_TABLET | Freq: Three times a day (TID) | ORAL | Status: DC
  Administered 2018-11-06 – 2018-11-07 (×3): 25 mg via ORAL
  Filled 2018-11-06 (×4): qty 1

## 2018-11-06 MED ORDER — CEFAZOLIN SODIUM-DEXTROSE 2-4 GM/100ML-% IV SOLN
2.0000 g | Freq: Three times a day (TID) | INTRAVENOUS | Status: AC
  Administered 2018-11-06 – 2018-11-07 (×2): 2 g via INTRAVENOUS
  Filled 2018-11-06 (×2): qty 100

## 2018-11-06 MED ORDER — RESOURCE THICKENUP CLEAR PO POWD
ORAL | Status: DC | PRN
  Filled 2018-11-06: qty 125

## 2018-11-06 MED ORDER — QUETIAPINE FUMARATE 100 MG PO TABS
200.0000 mg | ORAL_TABLET | Freq: Two times a day (BID) | ORAL | Status: DC
  Administered 2018-11-06 – 2018-11-10 (×8): 200 mg via ORAL
  Filled 2018-11-06 (×8): qty 2

## 2018-11-06 MED ORDER — ACETAMINOPHEN 650 MG RE SUPP: 325.0000 mg | RECTAL | Status: DC | PRN

## 2018-11-06 MED ORDER — ENOXAPARIN SODIUM 40 MG/0.4ML ~~LOC~~ SOLN: 40.0000 mg | SUBCUTANEOUS | Status: DC

## 2018-11-06 MED ORDER — OXYCODONE-ACETAMINOPHEN 5-325 MG PO TABS: 1.0000 | ORAL_TABLET | ORAL | Status: DC | PRN

## 2018-11-06 MED ORDER — ONDANSETRON 4 MG PO TBDP
4.0000 mg | ORAL_TABLET | Freq: Four times a day (QID) | ORAL | Status: DC | PRN
  Filled 2018-11-06: qty 1

## 2018-11-06 MED ORDER — ENOXAPARIN SODIUM 40 MG/0.4ML ~~LOC~~ SOLN
40.0000 mg | SUBCUTANEOUS | Status: DC
  Administered 2018-11-07: 40 mg via SUBCUTANEOUS
  Filled 2018-11-06: qty 0.4

## 2018-11-06 MED ORDER — SALINE SPRAY 0.65 % NA SOLN
2.0000 | NASAL | Status: DC | PRN
  Administered 2018-11-07: 2 via NASAL
  Filled 2018-11-06: qty 44

## 2018-11-06 MED ORDER — BACITRACIN ZINC 500 UNIT/GM EX OINT
TOPICAL_OINTMENT | Freq: Every day | CUTANEOUS | Status: DC
  Administered 2018-11-07: 06:00:00 via TOPICAL
  Filled 2018-11-06 (×2): qty 28.35

## 2018-11-06 MED ORDER — PROSIGHT PO TABS
1.0000 | ORAL_TABLET | Freq: Every day | ORAL | Status: DC
  Administered 2018-11-07: 1 via ORAL
  Filled 2018-11-06: qty 1

## 2018-11-06 MED ORDER — CHLORHEXIDINE GLUCONATE 0.12 % MT SOLN
15.0000 mL | Freq: Two times a day (BID) | OROMUCOSAL | Status: DC
  Administered 2018-11-06 – 2018-11-07 (×2): 15 mL via OROMUCOSAL
  Filled 2018-11-06 (×3): qty 15

## 2018-11-06 MED ORDER — ALBUTEROL SULFATE (2.5 MG/3ML) 0.083% IN NEBU
3.0000 mL | INHALATION_SOLUTION | RESPIRATORY_TRACT | Status: DC | PRN
  Administered 2018-11-06: 3 mL via RESPIRATORY_TRACT
  Filled 2018-11-06: qty 3

## 2018-11-06 MED ORDER — CLONAZEPAM 0.5 MG PO TABS
2.0000 mg | ORAL_TABLET | Freq: Two times a day (BID) | ORAL | Status: DC
  Administered 2018-11-06 – 2018-11-10 (×7): 2 mg via ORAL
  Filled 2018-11-06 (×7): qty 4

## 2018-11-06 MED ORDER — OXYCODONE HCL 5 MG PO TABS: 5.0000 mg | ORAL_TABLET | ORAL | Status: DC | PRN

## 2018-11-06 MED ORDER — SORBITOL 70 % SOLN: 30.0000 mL | Freq: Every day | Status: DC | PRN

## 2018-11-06 MED ORDER — ONDANSETRON HCL 4 MG/2ML IJ SOLN: 4.0000 mg | Freq: Four times a day (QID) | INTRAMUSCULAR | Status: DC | PRN

## 2018-11-06 MED ORDER — ACETAMINOPHEN 325 MG PO TABS
325.0000 mg | ORAL_TABLET | ORAL | Status: DC | PRN
  Administered 2018-11-06: 650 mg via ORAL
  Filled 2018-11-06: qty 2
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
## 2018-11-06 NOTE — Progress Notes (Signed)
Patient dressed and taken to 4W08. Security slip with info on personal belongings has been placed in patient's folder at nursing station and receiving RN made aware. Family notified of patient's transfer. Adaley Kiene, [place] Scrape, RN

## 2018-11-06 NOTE — Telephone Encounter (Signed)
sch appt [facility] 12/19/2018 930am f/u MD

## 2018-11-06 NOTE — Progress Notes (Signed)
Pt admitted to 4W08. Pt alert and oriented. Safety plan explained to patient and pt given call bell. RN discussed Dysphagia diet with family and patient. Patient stated  He would not eat anything except for applesauce. Pt would like to have ice chips. I explained that speech therapy would need to evaluate him first and approve of this before he could have them. I encouraged family to bring in foods that patient enjoyed as long as they were pureed. Continue plan of care.

## 2018-11-06 NOTE — Discharge Instructions (Signed)
Instruction for facial fractures:  Ice pack x 24 hrs Keep head elevated 3-4 nights Drip pad and change as needed Recheck my office 1 week. [phone] for an appointment.  If he is still here in the hospital, I will come back to look at him here.     PNEUMOTHORAX OR HEMOTHORAX +/- RIB FRACTURES  HOME INSTRUCTIONS   1. PAIN CONTROL:  1. Pain is best controlled by a usual combination of three different methods TOGETHER:  i. Ice/Heat ii. Over the counter pain medication iii. Prescription pain medication 2. You may experience some swelling and bruising in area of broken ribs. Ice packs or heating pads (30-60 minutes up to 6 times a day) will help. Use ice for the first few days to help decrease swelling and bruising, then switch to heat to help relax tight/sore spots and speed recovery. Some people prefer to use ice alone, heat alone, alternating between ice & heat. Experiment to what works for you. Swelling and bruising can take several weeks to resolve.  3. It is helpful to take an over-the-counter pain medication regularly for the first few weeks. Choose one of the following that works best for you:  i. Naproxen (Aleve, etc) Two 220mg  tabs twice a day ii. Ibuprofen (Advil, etc) Three 200mg  tabs four times a day (every meal & bedtime) iii. Acetaminophen (Tylenol, etc) 500-650mg  four times a day (every meal & bedtime) 4. A prescription for pain medication (such as oxycodone, hydrocodone, etc) may be given to you upon discharge. Take your pain medication as prescribed.  i. If you are having problems/concerns with the prescription medicine (does not control pain, nausea, vomiting, rash, itching, etc), please call us [phone] to see if we need to switch you to a different pain medicine that will work better for you and/or control your side effect better. ii. If you need a refill on your pain medication, please contact your pharmacy. They will contact our office to request authorization.  Prescriptions will not be filled after 5 pm or on week-ends. 1. Avoid getting constipated. When taking pain medications, it is common to experience some constipation. Increasing fluid intake and taking a fiber supplement (such as Metamucil, Citrucel, FiberCon, MiraLax, etc) 1-2 times a day regularly will usually help prevent this problem from occurring. A mild laxative (prune juice, Milk of Magnesia, MiraLax, etc) should be taken according to package directions if there are no bowel movements after 48 hours.  2. Watch out for diarrhea. If you have many loose bowel movements, simplify your diet to bland foods & liquids for a few days. Stop any stool softeners and decrease your fiber supplement. Switching to mild anti-diarrheal medications (Kayopectate, Pepto Bismol) can help. If this worsens or does not improve, please call us. 3. Chest tube site wound: you may remove the dressing from your chest tube site 3 days after the removal of your chest tube. DO NOT shower over the dressing. Once   removed, you may shower as normal. Do not submerge your wound in water for 2-3 weeks.  4. FOLLOW UP  a. Please call our office to set up or confirm an appointment for follow up for 2 weeks after discharge. You will need to get a chest xray at either [facility] or [facility]. This will be outlined in your follow up instructions. Please call [facility] at [phone] if you have any questions about follow up.  b. If you have any orthopedic or other injuries you will need to follow  up as outlined in your follow up instructions.   WHEN TO CALL us [phone]:  1. Poor pain control 2. Reactions / problems with new medications (rash/itching, nausea, etc)  3. Fever over 101.5 F (38.5 C) 4. Worsening swelling or bruising 5. Redness, drainage, pain or swelling around chest tube site 6. Worsening pain, productive cough, difficulty breathing or any other concerning symptoms  The clinic staff is available to answer  your questions during regular business hours (8:30am-5pm). Please dont hesitate to call and ask to speak to one of our nurses for clinical concerns.  If you have a medical emergency, go to the nearest emergency room or call 911.  A surgeon from [facility] is always on call at the [facility], [place]  [address], [address], [place], [place] [address] ?  MAIN: [phone] ? TOLL FREE: [phone] ?  FAX [phone]  [email or web address]      Information on Rib Fractures  A rib fracture is a break or crack in one of the bones of the ribs. The ribs are long, curved bones that wrap around your chest and attach to your spine and your breastbone. The ribs protect your heart, lungs, and other organs in the chest. A broken or cracked rib is often painful but is not usually serious. Most rib fractures heal on their own over time. However, rib fractures can be more serious if multiple ribs are broken or if broken ribs move out of place and push against other structures or organs. What are the causes? This condition is caused by:  Repetitive movements with high force, such as pitching a baseball or having severe coughing spells.  A direct blow to the chest, such as a sports injury, a car accident, or a fall.  Cancer that has spread to the bones, which can weaken bones and cause them to break. What are the signs or symptoms? Symptoms of this condition include:  Pain when you breathe in or cough.  Pain when someone presses on the injured area.  Feeling short of breath. How is this diagnosed? This condition is diagnosed with a physical exam and medical history. Imaging tests may also be done, such as:  Chest X-ray.  CT scan.  MRI.  Bone scan.  Chest ultrasound. How is this treated? Treatment for this condition depends on the severity of the fracture. Most rib fractures usually heal on their own in 1-3 months. Sometimes  healing takes longer if there is a cough that does not stop or if there are other activities that make the injury worse (aggravating factors). While you heal, you will be given medicines to control the pain. You will also be taught deep breathing exercises. Severe injuries may require hospitalization or surgery. Follow these instructions at home: Managing pain, stiffness, and swelling  If directed, apply ice to the injured area. ? Put ice in a plastic bag. ? Place a towel between your skin and the bag. ? Leave the ice on for 20 minutes, 2-3 times a day.  Take over-the-counter and prescription medicines only as told by your health care provider. Activity  Avoid a lot of activity and any activities or movements that cause pain. Be careful during activities and avoid bumping the injured rib.  Slowly increase your activity as told by your health care provider. General instructions  Do deep breathing exercises as told by your health care provider. This helps prevent pneumonia, which is a common complication of  a broken rib. Your health care provider may instruct you to: ? Take deep breaths several times a day. ? Try to cough several times a day, holding a pillow against the injured area. ? Use a device called incentive spirometer to practice deep breathing several times a day.  Drink enough fluid to keep your urine pale yellow.  Do not wear a rib belt or binder. These restrict breathing, which can lead to pneumonia.  Keep all follow-up visits as told by your health care provider. This is important. Contact a health care provider if:  You have a fever. Get help right away if:  You have difficulty breathing or you are short of breath.  You develop a cough that does not stop, or you cough up thick or bloody sputum.  You have nausea, vomiting, or pain in your abdomen.  Your pain gets worse and medicine does not help. Summary  A rib fracture is a break or crack in one of the bones of  the ribs.  A broken or cracked rib is often painful but is not usually serious.  Most rib fractures heal on their own over time.  Treatment for this condition depends on the severity of the fracture.  Avoid a lot of activity and any activities or movements that cause pain. This information is not intended to replace advice given to you by your health care provider. Make sure you discuss any questions you have with your health care provider. Document Released: 10/10/2005 Document Revised: 01/09/2017 Document Reviewed: 01/09/2017 Elsevier Interactive Patient Education  2019 [employer]    Pneumothorax A pneumothorax is commonly called a collapsed lung. It is a condition in which air leaks from a lung and builds up between the thin layer of tissue that covers the lungs (visceral pleura) and the interior wall of the chest cavity (parietal pleura). The air gets trapped outside the lung, between the lung and the chest wall (pleural space). The air takes up space and prevents the lung from fully expanding. This condition sometimes occurs suddenly with no apparent cause. The buildup of air may be small or large. A small pneumothorax may go away on its own. A large pneumothorax will require treatment and hospitalization. What are the causes? This condition may be caused by:  Trauma and injury to the chest wall.  Surgery and other medical procedures.  A complication of an underlying lung problem, especially chronic obstructive pulmonary disease (COPD) or emphysema. Sometimes the cause of this condition is not known. What increases the risk? You are more likely to develop this condition if:  You have an underlying lung problem.  You smoke.  You are 49-39 years old, male, tall, and underweight.  You have a personal or family history of pneumothorax.  You have an eating disorder (anorexia nervosa). This condition can also happen quickly, even in people with no history of lung  problems. What are the signs or symptoms? Sometimes a pneumothorax will have no symptoms. When symptoms are present, they can include:  Chest pain.  Shortness of breath.  Increased rate of breathing.  Bluish color to your lips or skin (cyanosis). How is this diagnosed? This condition may be diagnosed by:  A medical history and physical exam.  A chest X-ray, chest CT scan, or ultrasound. How is this treated? Treatment depends on how severe your condition is. The goal of treatment is to remove the extra air and allow your lung to expand back to its normal size.  For a  small pneumothorax: ? No treatment may be needed. ? Extra oxygen is sometimes used to make it go away more quickly.  For a large pneumothorax or a pneumothorax that is causing symptoms, a procedure is done to drain the air from your lungs. To do this, a health care provider may use: ? A needle with a syringe. This is used to suck air from a pleural space where no additional leakage is taking place. ? A chest tube. This is used to suck air where there is ongoing leakage into the pleural space. The chest tube may need to remain in place for several days until the air leak has healed.  In more severe cases, surgery may be needed to repair the damage that is causing the leak.  If you have multiple pneumothorax episodes or have an air leak that will not heal, a procedure called a pleurodesis may be done. A medicine is placed in the pleural space to irritate the tissues around the lung so that the lung will stick to the chest wall, seal any leaks, and stop any buildup of air in that space. If you have an underlying lung problem, severe symptoms, or a large pneumothorax you will usually need to stay in the hospital. Follow these instructions at home: Lifestyle  Do not use any products that contain nicotine or tobacco, such as cigarettes and e-cigarettes. These are major risk factors in pneumothorax. If you need help quitting,  ask your health care provider.  Do not lift anything that is heavier than 10 lb (4.5 kg), or the limit that your health care provider tells you, until he or she says that it is safe.  Avoid activities that take a lot of effort (strenuous) for as long as told by your health care provider.  Return to your normal activities as told by your health care provider. Ask your health care provider what activities are safe for you.  Do not fly in an airplane or scuba dive until your health care provider says it is okay. General instructions  Take over-the-counter and prescription medicines only as told by your health care provider.  If a cough or pain makes it difficult for you to sleep at night, try sleeping in a semi-upright position in a recliner or by using 2 or 3 pillows.  If you had a chest tube and it was removed, ask your health care provider when you can remove the bandage (dressing). While the dressing is in place, do not allow it to get wet.  Keep all follow-up visits as told by your health care provider. This is important. Contact a health care provider if:  You cough up thick mucus (sputum) that is yellow or green in color.  You were treated with a chest tube, and you have redness, increasing pain, or discharge at the site where it was placed. Get help right away if:  You have increasing chest pain or shortness of breath.  You have a cough that will not go away.  You begin coughing up blood.  You have pain that is getting worse or is not controlled with medicines.  The site where your chest tube was located opens up.  You feel air coming out of the site where the chest tube was placed.  You have a fever or persistent symptoms for more than 2-3 days.  You have a fever and your symptoms suddenly get worse. These symptoms may represent a serious problem that is an emergency. Do not wait to  see if the symptoms will go away. Get medical help right away. Call your local emergency  services (911 in the [place] Do not drive yourself to the hospital. Summary  A pneumothorax, commonly called a collapsed lung, is a condition in which air leaks from a lung and gets trapped between the lung and the chest wall (pleural space).  The buildup of air may be small or large. A small pneumothorax may go away on its own. A large pneumothorax will require treatment and hospitalization.  Treatment for this condition depends on how severe the pneumothorax is. The goal of treatment is to remove the extra air and allow the lung to expand back to its normal size. This information is not intended to replace advice given to you by your health care provider. Make sure you discuss any questions you have with your health care provider. Document Released: 10/10/2005 Document Revised: 09/18/2017 Document Reviewed: 09/18/2017 Elsevier Interactive Patient Education  2019 [employer].
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
## 2018-11-06 NOTE — Progress Notes (Signed)
Patient has verbalized that he does not want to do inpatient rehab but would rather go home and have outpatient therapies. RN has encouraged patient to do what PT/OT/SP have recommended and what they believe is most appropriate at this point in his recovery. Mom is at the bedside and attempts to encourage patient as well. RN has paged CIR case management to speak with patient. No final decisions have been made at this time. Haruye Lainez, [place] Scrape, RN
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
## 2018-11-07 ENCOUNTER — Inpatient Hospital Stay (HOSPITAL_COMMUNITY): Payer: BLUE CROSS/BLUE SHIELD | Admitting: Speech Pathology

## 2018-11-07 ENCOUNTER — Inpatient Hospital Stay (HOSPITAL_COMMUNITY): Payer: Self-pay

## 2018-11-07 ENCOUNTER — Inpatient Hospital Stay (HOSPITAL_COMMUNITY): Payer: Self-pay | Admitting: Physical Therapy

## 2018-11-07 ENCOUNTER — Ambulatory Visit (HOSPITAL_COMMUNITY): Payer: BLUE CROSS/BLUE SHIELD

## 2018-11-07 DIAGNOSIS — S069X2S Unspecified intracranial injury with loss of consciousness of 31 minutes to 59 minutes, sequela: Secondary | ICD-10-CM

## 2018-11-07 DIAGNOSIS — M7989 Other specified soft tissue disorders: Secondary | ICD-10-CM

## 2018-11-07 LAB — COMPREHENSIVE METABOLIC PANEL
ALT: 27 U/L (ref 0–44)
AST: 27 U/L (ref 15–41)
Albumin: 3.3 g/dL — ABNORMAL LOW (ref 3.5–5.0)
Alkaline Phosphatase: 123 U/L (ref 38–126)
Anion gap: 9 (ref 5–15)
BUN: 16 mg/dL (ref 6–20)
CO2: 22 mmol/L (ref 22–32)
Calcium: 9.1 mg/dL (ref 8.9–10.3)
Chloride: 109 mmol/L (ref 98–111)
Creatinine, Ser: 0.81 mg/dL (ref 0.61–1.24)
GFR calc non Af Amer: >60 mL/min (ref >60)
Glucose, Bld: 98 mg/dL (ref 70–99)
Potassium: 3.6 mmol/L (ref 3.5–5.1)
Sodium: 140 mmol/L (ref 135–145)
TOTAL PROTEIN: 6.9 g/dL (ref 6.5–8.1)
Total Bilirubin: 0.6 mg/dL (ref 0.3–1.2)

## 2018-11-07 LAB — CBC WITH DIFFERENTIAL/PLATELET
Abs Immature Granulocytes: 0.28 10*3/uL — ABNORMAL HIGH (ref 0.00–0.07)
Basophils Absolute: 0.1 10*3/uL (ref 0.0–0.1)
Basophils Relative: 1 %
EOS PCT: 6 %
Eosinophils Absolute: 0.5 10*3/uL (ref 0.0–0.5)
HCT: 37.3 % — ABNORMAL LOW (ref 39.0–52.0)
Hemoglobin: 11.9 g/dL — ABNORMAL LOW (ref 13.0–17.0)
Immature Granulocytes: 4 %
Lymphocytes Relative: 27 %
Lymphs Abs: 2.1 10*3/uL (ref 0.7–4.0)
MCH: 27.7 pg (ref 26.0–34.0)
MCHC: 31.9 g/dL (ref 30.0–36.0)
MCV: 86.7 fL (ref 80.0–100.0)
Monocytes Absolute: 0.9 10*3/uL (ref 0.1–1.0)
Monocytes Relative: 11 %
Neutro Abs: 3.9 10*3/uL (ref 1.7–7.7)
Neutrophils Relative %: 51 %
Platelets: 604 10*3/uL — ABNORMAL HIGH (ref 150–400)
RBC: 4.3 MIL/uL (ref 4.22–5.81)
RDW: 13.1 % (ref 11.5–15.5)
WBC: 7.7 10*3/uL (ref 4.0–10.5)
nRBC: 0 % (ref 0.0–0.2)

## 2018-11-07 NOTE — Progress Notes (Signed)
Bilateral lower extremities venous duplex exam completed. Please see more details on chart review CV PROC preliminary notes. Havah Ammon H Keshauna Degraffenreid(RDMS RVT) 11/07/18 5:40 PM

## 2018-11-07 NOTE — Progress Notes (Signed)
Occupational Therapy Session Note  Patient Details  Name: [name] Hall MRN: [id number] Date of Birth: 1985-12-30  Today's Date: 11/07/2018 OT Individual Time: 1500-1526 OT Individual Time Calculation (min): 26 min    Short Term Goals: Week 1:  OT Short Term Goal 1 (Week 1): n/a d/t ELOS  Skilled Therapeutic Interventions/Progress Updates:     1;1. Pt completes ambulation to/from all tx destinations with RW and min guard for balnace recivery after 1 lateral LOB R. Overall pt with improved coordination of steps and RW as compared to morning session. Pt completes standing horse shoe toss for 3 rounds (standard tile, compliant surface and foam wedge) to improve balnace, righting reactions and ankle strategy required for ADLs. Pt requires up to min A for posterior and R LOB on complaint wedge. Pt able to retrieve pitched horse shoes off the floor with CGA. Exited session with pt seated in bed, call light in reach and all needs met.  Therapy Documentation Precautions:  Precautions Precautions: Fall Precaution Comments: MD discharged cervical collar during session  Required Braces or Orthoses: Other Brace Other Brace: post op shoe right foot Restrictions Weight Bearing Restrictions: Yes RLE Weight Bearing: Weight bearing as tolerated Other Position/Activity Restrictions: ortho note mentions post op shoe, but I don't see one in the room. General:   Vital Signs: Therapy Vitals Temp: 97.9 F (36.6 C) Temp Source: Oral Pulse Rate: 70 Resp: 17 BP: 126/84 Patient Position (if appropriate): Lying Oxygen Therapy SpO2: 99 % O2 Device: Room Air Pain:   ADL: ADL Where Assessed-Grooming: Standing at sink Upper Body Bathing: Contact guard Upper Body Dressing: Setup Where Assessed-Upper Body Dressing: Edge of bed Lower Body Dressing: Contact guard Where Assessed-Lower Body Dressing: Edge of bed Toileting: Minimal assistance Where Assessed-Toileting: Glass blower/designer: Microbiologist Method: Ambulating Vision   Perception  Perception: Within Functional Limits Praxis Praxis: Impaired Praxis Impairment Details: Motor planning Praxis-Other Comments: large stepping wiht RLE intermittently Exercises:   Other Treatments:     Therapy/Group: Individual Therapy  Tonny Branch 11/07/2018, 3:26 PM

## 2018-11-07 NOTE — Progress Notes (Signed)
Pt refused Urecholine. Educated pt on medication he continue to refused. Pt noted, "I do not want to take any medication. I'm not going to take it when I go home."   Pt had family/friends in room with him

## 2018-11-07 NOTE — Plan of Care (Signed)
  Problem: Consults Goal: RH BRAIN INJURY PATIENT EDUCATION Description Description: See Patient Education module for eduction specifics Outcome: Progressing   Problem: RH SKIN INTEGRITY Goal: RH STG SKIN FREE OF INFECTION/BREAKDOWN Description No new breakdown or infection with min assist   Outcome: Progressing   Problem: RH SAFETY Goal: RH STG ADHERE TO SAFETY PRECAUTIONS W/ASSISTANCE/DEVICE Description STG Adhere to Safety Precautions With Mod I Assistance/Device.  Outcome: Progressing   Problem: RH COGNITION-NURSING Goal: RH STG ANTICIPATES NEEDS/CALLS FOR ASSIST W/ASSIST/CUES Description STG Anticipates Needs/Calls for Assist With Min Assistance/Cues.  Outcome: Progressing
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
## 2018-11-07 NOTE — Progress Notes (Signed)
Speech Language Pathology Daily Session Note  Patient Details  Name: [name] Hall MRN: [id number] Date of Birth: Feb 19, 1986  Today's Date: 11/07/2018 SLP Individual Time: 1200-1225 SLP Individual Time Calculation (min): 25 min  Short Term Goals: Week 1: SLP Short Term Goal 1 (Week 1): STGs=LTGs due to short length of stay   Skilled Therapeutic Interventions: Skilled treatment session focused on dysphagia goals. SLP facilitated session by attempting to provide skilled observation with lunch meal of upgraded Dys. 2 textures with pudding thick liquids.  Patient took ~2 bites of food and orally expectorated them both with reporting, "I wouldn't feed that to my dog." SLP provided encouragement without success.  Patient eventually agreeable to consuming strawberry yogurt and applesauce. Patient consumed Dys. 1 textures without overt s/s of aspiration. Recommend patient remain on Dys. 2 textures in hopes of maximizing PO intake.  NT and RN educated on importance of full supervision and strict aspiration precautions. Also informed by patient that his friend was going to bring him fast food. Educated on importance of adherence to strict dysphagia diet. RN also aware of potential risk. Patient left upright in bed with mom present and alarm on. Continue with current plan of care.      Pain No/Denies Pain   Therapy/Group: Individual Therapy  [name] 11/07/2018, 1:59 PM
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
## 2018-11-07 NOTE — Progress Notes (Signed)
Patient information reviewed and entered into [facility] System by Becky Azaylia Fong, PPS coordinator. Information including medical coding, function ability, and quality indicators will be reviewed and updated through discharge.   

## 2018-11-07 NOTE — Plan of Care (Signed)
  Problem: RH SAFETY Goal: RH STG ADHERE TO SAFETY PRECAUTIONS W/ASSISTANCE/DEVICE Description STG Adhere to Safety Precautions With Mod I Assistance/Device.  Outcome: Progressing  Call light within reach, bed/chair alarm, proper footwear, proper transfers, meds crushed, thicken liquid

## 2018-11-08 ENCOUNTER — Inpatient Hospital Stay (HOSPITAL_COMMUNITY): Payer: Self-pay | Admitting: Occupational Therapy

## 2018-11-08 ENCOUNTER — Inpatient Hospital Stay (HOSPITAL_COMMUNITY): Payer: Self-pay | Admitting: Speech Pathology

## 2018-11-08 ENCOUNTER — Inpatient Hospital Stay (HOSPITAL_COMMUNITY): Payer: Self-pay | Admitting: Physical Therapy

## 2018-11-08 DIAGNOSIS — R4689 Other symptoms and signs involving appearance and behavior: Secondary | ICD-10-CM

## 2018-11-08 DIAGNOSIS — S069X0S Unspecified intracranial injury without loss of consciousness, sequela: Secondary | ICD-10-CM

## 2018-11-08 MED ORDER — DIVALPROEX SODIUM 500 MG PO DR TAB
500.0000 mg | DELAYED_RELEASE_TABLET | Freq: Two times a day (BID) | ORAL | Status: DC
  Administered 2018-11-08 – 2018-11-10 (×5): 500 mg via ORAL
  Filled 2018-11-08 (×5): qty 1

## 2018-11-08 NOTE — Care Management (Signed)
[facility] Individual Statement of Services  Patient Name:  [name] Hall  Date:  11/08/2018  Welcome to the [facility].  Our goal is to provide you with an individualized program based on your diagnosis and situation, designed to meet your specific needs.  With this comprehensive rehabilitation program, you will be expected to participate in at least 3 hours of rehabilitation therapies Monday-Friday, with modified therapy programming on the weekends.  Your rehabilitation program will include the following services:  Physical Therapy (PT), Occupational Therapy (OT), Speech Therapy (ST), 24 hour per day rehabilitation nursing, Neuropsychology, Case Management (Social Worker), Rehabilitation Medicine, Nutrition Services and Pharmacy Services  Weekly team conferences will be held on Tuesdays to discuss your progress.  Your Social Worker will talk with you frequently to get your input and to update you on team discussions.  Team conferences with you and your family in attendance may also be held.  Expected length of stay: 5-7 days   Overall anticipated outcome: supervision  Depending on your progress and recovery, your program may change. Your Social Worker will coordinate services and will keep you informed of any changes. Your Social Worker's name and contact numbers are listed  below.  The following services may also be recommended but are not provided by the [facility]:   Driving Evaluations  Home Health Rehabiltiation Services  Outpatient Rehabilitation Services  Vocational Rehabilitation  Arrangements will be made to provide these services after discharge if needed.  Arrangements include referral to agencies that provide these services.  Your insurance has been verified to be:  BCBS Your primary doctor is:  Armed forces training and education officer, PA-C  Pertinent information will be shared with your doctor and your insurance  company.  Social Worker:  [place], [place] [phone] or ([phone]   Information discussed with and copy given to patient by: Amada Jupiter, 11/08/2018, 2:06 PM

## 2018-11-08 NOTE — Plan of Care (Addendum)
  Problem: RH SAFETY Goal: RH STG ADHERE TO SAFETY PRECAUTIONS W/ASSISTANCE/DEVICE Description STG Adhere to Safety Precautions With Mod I Assistance/Device.  Outcome: Not Progressing  Pt refuses to allow staff to implement a safety plan.  Ambulates in the room without any devices. He is unsteady

## 2018-11-08 NOTE — Progress Notes (Addendum)
Speech Language Pathology Daily Session Note  Patient Details  Name: [name] Hall MRN: [id number] Date of Birth: December 18, 1985  Today's Date: 11/08/2018 SLP Individual Time: 1130-1200 SLP Individual Time Calculation (min): 30 min  Short Term Goals: Week 1: SLP Short Term Goal 1 (Week 1): STGs=LTGs due to short length of stay   Skilled Therapeutic Interventions: Skilled treatment session focused on cognitive and dysphagia goals. Upon arrival, patient was calm and apologized to this clinician for earlier agitation (ABS Score:16).  Patient performed oral care via the suction toothbrush independently and consumed trials of thin liquids via cup without overt coughing but with an intermittent throat clear. However, difficult to differentiate due to constant throat clear throughout session. Recommend patient upgrade to the water protocol (d/c ice chip protocol). Provided education in regards to repeat MBS tomorrow at 9. Patient verbalized understanding and agreement. Patient left supine in bed. Continue with current plan of care.      Pain Pain Assessment Pain Scale: 0-10 Pain Score: 0-No pain  Therapy/Group: Individual Therapy  [name] Hall 11/08/2018, 12:23 PM

## 2018-11-08 NOTE — Progress Notes (Signed)
Physical Therapy Session Note  Patient Details  Name: [name] MRN: [id number] Date of Birth: May 26, 1986  Today's Date: 11/08/2018 PT Individual Time: 1300-1330 PT Individual Time Calculation (min): 30 min   Short Term Goals: Week 1:  PT Short Term Goal 1 (Week 1): =LTG due to ELOS  Skilled Therapeutic Interventions/Progress Updates:    pt in bed, agreeable to therapy. Pt performs gait throughout unit, 200' x 3 with close supervision. Several LOB which pt is able to self correct. Pt refuses to wear post op shoe, stating he feels more comfortable in his own sneakers. Pt performs stair negotiation with bilat handrails x 8 steps with supervision.  Seated and standing balance with wii sports games with pt requiring increased time and cuing to problem solve operation of wii system, supervision for balance. Pt left in room with all needs at hand.  Therapy Documentation Precautions:  Precautions Precautions: Fall Precaution Comments: MD discharged cervical collar during session  Required Braces or Orthoses: Other Brace Other Brace: post op shoe right foot Restrictions Weight Bearing Restrictions: Yes RLE Weight Bearing: Weight bearing as tolerated Other Position/Activity Restrictions: ortho note mentions post op shoe, but I don't see one in the room.    Therapy/Group: Individual Therapy  Kristofer Schaffert 11/08/2018, 1:39 PM
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
## 2018-11-08 NOTE — Plan of Care (Signed)
[name] Hall's Behavioral Plan  Behavior to decrease: agitation with wanting to leave, demonstrated verbal escalation with threats of harm to others. Behaviors consistent with BI and previous behaviors with family. Immediate Intervention: Everyone interacting with [name] should try and respond to their behavior in the same manner. Pt is a Rancho 7 and is oriented and can hold on to information.   Interactions should include: 1. Calm voice.  2. Allow him to express himself. 3. He is allowed to be up in his room alone (without the bed alarm on) 4. He currently is upgraded to the water protocol with oral care.  5. No Vital checks at night; allowing him to sleep. 6. Take his meds at meal time to limit interruptions (ie night meds at dinner time). 7. Do not make promises we can not keep.   Other Recommendations:. 1. Placed as "high" aggressive scale. Orange magnet placed outside of door. 2. ABS to be documented on in each therapy session as well as 3x a 12 hr shirt with nursing 3. Pick up mats on floor to prevent tripping over them. 4. Set therapy schedule for Friday 5. SW, Lucy to follow up with uncle/ family to come to attend therapy tomorrow in prep for d/c. Valentina Gu also to follow up with security about high risk personal belongings (to be given at d/c.) 6. Can call security if need support. On target d/c day is Saturday. Patient is aware of this.
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
## 2018-11-08 NOTE — Progress Notes (Signed)
Speech Language Pathology Daily Session Note  Patient Details  Name: [name] Hall MRN: [id number] Date of Birth: 22-Aug-1986  Today's Date: 11/08/2018 SLP Individual Time: 1345-1420 SLP Individual Time Calculation (min): 35 min  Short Term Goals: Week 1: SLP Short Term Goal 1 (Week 1): STGs=LTGs due to short length of stay   Skilled Therapeutic Interventions:  Skilled treatment session focused on dysphagia and cognition goals. SLP sought out by pt's mother. Pt had requested his mother to bring him [facility]. Education provided to his mother at the nursing station. Mother agreeable to not presenting pt with [facility] but requested SLP tell pt. SLP retrieved thin water from nursing station and left [facility] at nursing station to prevent any issues. SLP accompanied pt's mother to room, pt smiled at SLP (joked about not getting the [facility]) and gladly took the water. Pt consumed with rapid rate at first but slowed over course of 8 oz. Pt with 1 throat clear in 8 oz. SLP further facilitated session by providing general question cues regarding POC for MBS in the morning, pt continues to be agreeable, agreeable to staying until Saturday and states that his uncle will be in facility in the morning for training. SLP hopefully established rapport with pt in preparation for taking pt to radiology for MBS. Pt left side-laying in bed with his mother present. All needs were within reach and pt appeared pleasant.   Of note, when SLP entered room, pt was meeting with lawyer - per his mother's request. He agreed to contract with lawyer. However pt states that his uncle (whom he is discharging home with) isn't in-agreement with getting a Clinical research associate.      Pain  None reported by pt    Therapy/Group: Individual Therapy  Maurina Fawaz 11/08/2018, 2:52 PM

## 2018-11-08 NOTE — Progress Notes (Signed)
Pt refuses bed/chair alarm. Educated pt and mother on the reasoning of having the alarm; its resulted to the patient safety. Pt continue to refuse the safety alarm, at the time mats continues to be on the floor.

## 2018-11-08 NOTE — Progress Notes (Signed)
Patient agitated this am, irritated with bed alarm sounding as he is reaching across his bed for an item. States "I'm getting the hell out of here today, this is prison!" During rounding with am nurse, pt states "the only one I want to talk to is name Riley Kill!". Pt informed that MD will be making rounds shortly. Bed alarm intact.
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
## 2018-11-08 NOTE — Plan of Care (Signed)
  Problem: RH SKIN INTEGRITY Goal: RH STG SKIN FREE OF INFECTION/BREAKDOWN Description No new breakdown or infection with min assist   Outcome: Progressing   Problem: RH SAFETY Goal: RH STG ADHERE TO SAFETY PRECAUTIONS W/ASSISTANCE/DEVICE Description STG Adhere to Safety Precautions With Mod I Assistance/Device.  Outcome: Progressing   Problem: RH PAIN MANAGEMENT Goal: RH STG PAIN MANAGED AT OR BELOW PT'S PAIN GOAL Description < 3 out of 10.   Outcome: Progressing

## 2018-11-08 NOTE — Progress Notes (Signed)
SN reported, "He was bragging to his friend how he [place] nurses scared to come in his room." The SN reported to the charge nurse, as well.

## 2018-11-08 NOTE — Progress Notes (Signed)
Pt refused several medications, stating "I'm not taking that at home, I was addicted to pills and I'm not trying to get hooked". Pt educated on the indication and importance of medications, pt continued to refuse.
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
## 2018-11-08 NOTE — Progress Notes (Signed)
Occupational Therapy Note  Patient Details  Name: DARTAVIOUS HORNOR MRN: [id number] Date of Birth: February 18, 1986  Today's Date: 11/08/2018 OT Missed Time: 60 Minutes Missed Time Reason: Patient unwilling/refused to participate without medical reason  Pt missed 60 minutes of skilled OT treatment session 2/2 agitation and refusal to participate. Will follow up per plan of care.    Merlene Laughter Kian Ottaviano 11/08/2018, 2:58 PM

## 2018-11-09 ENCOUNTER — Inpatient Hospital Stay (HOSPITAL_COMMUNITY): Payer: Self-pay | Admitting: Physical Therapy

## 2018-11-09 ENCOUNTER — Inpatient Hospital Stay (HOSPITAL_COMMUNITY): Payer: Self-pay | Admitting: Occupational Therapy

## 2018-11-09 ENCOUNTER — Encounter (HOSPITAL_COMMUNITY): Payer: Self-pay | Admitting: Speech Pathology

## 2018-11-09 ENCOUNTER — Inpatient Hospital Stay (HOSPITAL_COMMUNITY): Payer: Self-pay | Admitting: Speech Pathology

## 2018-11-09 ENCOUNTER — Inpatient Hospital Stay (HOSPITAL_COMMUNITY): Payer: BLUE CROSS/BLUE SHIELD

## 2018-11-09 DIAGNOSIS — S92321S Displaced fracture of second metatarsal bone, right foot, sequela: Secondary | ICD-10-CM

## 2018-11-09 MED ORDER — ALBUTEROL SULFATE HFA 108 (90 BASE) MCG/ACT IN AERS: 2.0000 | INHALATION_SPRAY | Freq: Four times a day (QID) | RESPIRATORY_TRACT | 1 refills | Status: DC | PRN

## 2018-11-09 MED ORDER — ACETAMINOPHEN 325 MG PO TABS: 325.0000 mg | ORAL_TABLET | ORAL | Status: DC | PRN

## 2018-11-09 MED ORDER — DIVALPROEX SODIUM 500 MG PO DR TAB: 500.0000 mg | DELAYED_RELEASE_TABLET | Freq: Two times a day (BID) | ORAL | 0 refills | Status: DC

## 2018-11-09 MED ORDER — QUETIAPINE FUMARATE 200 MG PO TABS: 200.0000 mg | ORAL_TABLET | Freq: Two times a day (BID) | ORAL | 0 refills | Status: DC

## 2018-11-09 MED ORDER — CLONAZEPAM 2 MG PO TABS: 2.0000 mg | ORAL_TABLET | Freq: Two times a day (BID) | ORAL | 0 refills | Status: DC

## 2018-11-09 NOTE — Progress Notes (Signed)
Occupational Therapy Discharge Summary  Patient Details  Name: [name] Hall MRN: [id number] Date of Birth: 06-Apr-1986  Today's Date: 11/09/2018 OT Individual Time: 6283-1517 OT Individual Time Calculation (min): 45 min   Pt greeted asleep in bed with mother present. Pt easy to wake, pleasant, and agreeable to OT treatment session. Pt reports he showered last night and OT discussed any difficulties pt had and educated him of safe options within home environment. Pt reports washing his feet was difficult. OT provided pt with long-handled sponge and educated on uses and safety. Pt agreeable to practice tub bench transfer in therapy apartment. Pt donned shoes mod I, then ambulated to therapy apartment with supervision and a few LOBs with pt able to self-correct. Demonstrated use of tub bench for safety with transfers and also practiced stepping over tub ledge with pt able to do both with supervision. Pt reports some numbness and tingling in R hand. OT educated on safety regarding sensation deficits with pt verbalizing understanding. OT also provided pt with foam block for R grip strength and sensation. Pt ambulated back to room and was left seated EOB with family present. Discussed dc plan with mom and pt will have 24/7 supervision at uncles home. No further questions from pt or his mother. Pt left with needs met, floor mats up, and no alarm on per behavioral plan.  Patient has met 11 of 11 long term goals due to improved activity tolerance, improved balance, postural control, ability to compensate for deficits, functional use of  RIGHT upper and RIGHT lower extremity, improved attention, improved awareness and improved coordination.  Patient to discharge at overall Supervision/Mod I level.  Patient's care partner is independent to provide the necessary physical and cognitive assistance at discharge.    Reasons goals not met: n/a  Recommendation:  Patient will benefit from ongoing skilled OT services in  home health setting to continue to advance functional skills in the area of BADL.  Equipment: No equipment provided  Reasons for discharge: treatment goals met and discharge from hospital  Patient/family agrees with progress made and goals achieved: Yes  OT Discharge Precautions/Restrictions  Precautions Precautions: Fall Restrictions RLE Weight Bearing: Weight bearing as tolerated Pain Pain Assessment Pain Scale: 0-10 Pain Score: 0-No pain Faces Pain Scale: Hurts a little bit Pain Type: Acute pain Pain Location: Foot Pain Orientation: Right Pain Descriptors / Indicators: Aching Pain Onset: On-going Pain Intervention(s): Repositioned;Rest ADL ADL Eating: Independent Grooming: Independent Where Assessed-Grooming: Standing at sink Upper Body Bathing: Supervision/safety Lower Body Bathing: Supervision/safety Upper Body Dressing: Independent Where Assessed-Upper Body Dressing: Edge of bed Lower Body Dressing: Independent Where Assessed-Lower Body Dressing: Edge of bed Toileting: Independent Where Assessed-Toileting: Glass blower/designer: Programmer, applications Method: Human resources officer: Distant supervision Cognition Overall Cognitive Status: Impaired/Different from baseline Arousal/Alertness: Awake/alert Orientation Level: Oriented X4 Attention: Selective Sustained Attention: Appears intact Selective Attention: Impaired Selective Attention Impairment: Verbal complex;Functional complex Memory: Impaired Memory Impairment: Decreased short term memory Decreased Short Term Memory: Verbal complex;Functional complex Awareness: Impaired Awareness Impairment: Anticipatory impairment Problem Solving: Appears intact Executive Function: Reasoning;Decision Making;Self Monitoring;Self Correcting Reasoning: Impaired Reasoning Impairment: Verbal complex;Functional complex Decision Making: Impaired Decision Making Impairment: Verbal complex;Functional  complex Self Monitoring: Impaired Self Monitoring Impairment: Verbal complex;Functional complex Self Correcting: Impaired Self Correcting Impairment: Verbal complex;Functional complex Behaviors: Impulsive Safety/Judgment: Impaired Comments: d/t decreased recall [facility] Scales of Cognitive Functioning: Purposeful/appropriate Sensation Sensation Light Touch: Impaired Detail Light Touch Impaired Details: Impaired RUE Proprioception: Appears Intact Coordination Gross Motor Movements  are Fluid and Coordinated: Yes Coordination and Movement Description: Improved since eval, slightly slower on R side Heel Shin Test: intact BLE Motor  Motor Motor: Within Functional Limits Mobility  Bed Mobility Rolling Right: Independent Rolling Left: Independent Supine to Sit: Independent Sit to Supine: Independent Transfers Sit to Stand: Independent Stand to Sit: Independent  Trunk/Postural Assessment  Cervical Assessment Cervical Assessment: Within Functional Limits Thoracic Assessment Thoracic Assessment: Within Functional Limits Lumbar Assessment Lumbar Assessment: Within Functional Limits Postural Control Postural Control: Within Functional Limits  Balance Static Sitting Balance Static Sitting - Level of Assistance: 7: Independent Dynamic Sitting Balance Dynamic Sitting - Level of Assistance: 7: Independent Static Standing Balance Static Standing - Level of Assistance: 7: Independent Dynamic Standing Balance Dynamic Standing - Level of Assistance: 7: Independent Extremity/Trunk Assessment RUE Assessment RUE Assessment: Within Functional Limits LUE Assessment LUE Assessment: Within Functional Limits   Daneen Schick Tocarra Gassen 11/09/2018, 12:54 PM

## 2018-11-09 NOTE — Progress Notes (Signed)
Modified Barium Swallow Progress Note  Patient Details  Name: [name] Hall MRN: [id number] Date of Birth: 1986/04/22  Today's Date: 11/09/2018  Modified Barium Swallow completed.  Full report located under Chart Review in the Imaging Section.  Brief recommendations include the following:  Clinical Impression  Pt presents with improved swallow function over previous MBS. Overall, pt continues to exhibit decreased ability to effectively achieve consistent glottal closure across multiple trials of nectar thick liquids and thin liquids. When he doesn't achieve glottal closure, pt demonstrates inconsistent sensed aspiration. However, there were trials that were silently aspirated. When pt sensed, he produced an effective cough that cleared aspirates. However, pt is able to achieve great glottal closure with breath hold prior to swallow. Breath hold was consistently effective in preventing any aspiration acorss numerous trials of thin liquids via small cup sips. Pt was not able to effectively breath hold and swallow when ttempting to ocnsume whole pill with thin liquids, whole pill in puree followed by sip of thin liquids was appropriate. Although pt continues to remain at moderate risk of aspiration, he is able to consume thin liquids when consuming with breath hold before swallow. Extensive education provided to pt and his mother on continued risk for aspiration and potential health hazards of aspiration. Continue dysphaia 2 diet (d/t healing facial fx) with upgrade to thin liquids via cup, NO STRAWS, strict aspiration precautions (including oral care before meals) and medicine whole in puree.    Swallow Evaluation Recommendations       SLP Diet Recommendations: Dysphagia 2 (Fine chop) solids;Thin liquid   Liquid Administration via: Cup   Medication Administration: Whole meds with puree   Supervision: Patient able to self feed;Full supervision/cueing for compensatory strategies   Compensations: Minimize environmental distractions;Slow rate;Small sips/bites;Clear throat intermittently(hold breath prior to swallow)   Postural Changes: Seated upright at 90 degrees   Oral Care Recommendations: Oral care BID;Oral care before and after PO        Daneka Lantigua 11/09/2018,12:23 PM
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
## 2018-11-09 NOTE — Progress Notes (Signed)
Speech Language Pathology Discharge Summary  Patient Details  Name: [name] Hall MRN: [id number] Date of Birth: 1985/12/05  Today's Date: 11/09/2018   Skilled treatment session #1 SLP Individual Time: 0900-0920 SLP Individual Time Calculation (min): 20 min  Skilled treatment session #2 SLP Individual Time: 0945-1000 SLP Individual Time Calculation (min): 15 min   Skilled treatment session #3 SLP Individual Time: 9509-3267 SLP Individual Time Calculation (min): 30 min   Skilled Therapeutic Interventions:    Skilled treatment session #1 focused on completion of MBS. See report.   Skilled treatment session #2 focused on completing education with pt and his mom regarding MBS, use of breath hold prior to swallow to prevent aspiration. While pt voices understanding and is able to demonstrate effectively, he is likely not to perform once he goes home. He states that he will resume eating steaks and drinking whatever he wants. Extensive education and time spent reviewing risk of pneumonia - however pt states he has lived through pneumonia before so it doesn't scare him.   Skilled treatment session #3 focused on dysphagia and cognition goals. SLP facilitated session by providing supervision cues for recall of compensatory swallow strategies. Pt able to demonstrate superglottic swallow. Pt able to recall small sips via cup. He was able to recall what may happen if pt doesn't utilize swallow strategies. Education has been completed over multiple sessions with pt and his mother. Pt left in recliner with his mother present.     Patient has met 5 of 5 long term goals.  Patient to discharge at overall Supervision level.    Clinical Impression/Discharge Summary:   Pt has made progress in skilled ST and has met 5 of 5 LTGs at the supervision level. His swallow function has improved but he remains at continued risk of aspiration without use of breath hold prior to swallow. Pt continues to present  with overall impulsivity and decreased recall. During skilled ST pt has not presented with any frustration to task or agitation but he is very self-directed which places him at risk. Recommend HHST to follow up to target safety awareness within home and for safe consumption of thin liquids.    Care Partner:  Caregiver Able to Provide Assistance: Yes  Type of Caregiver Assistance: Cognitive  Recommendation:  Outpatient SLP;24 hour supervision/assistance;Home Health SLP  Rationale for SLP Follow Up: Maximize cognitive function and independence;Maximize swallowing safety;Reduce caregiver burden   Equipment:     Reasons for discharge: Discharged from hospital   Patient/Family Agrees with Progress Made and Goals Achieved: Yes    Kadeshia Kasparian 11/09/2018, 12:34 PM

## 2018-11-09 NOTE — Progress Notes (Signed)
Patient compliant with HS medications. Cooperative, occasionally walking out into the halll. No agitation or aggressive behavior noted
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
## 2018-11-09 NOTE — Discharge Summary (Signed)
Discharge summary job # [id number]

## 2018-11-09 NOTE — Plan of Care (Signed)
  Problem: Consults Goal: RH BRAIN INJURY PATIENT EDUCATION Description Description: See Patient Education module for eduction specifics Outcome: Progressing   Problem: RH SKIN INTEGRITY Goal: RH STG SKIN FREE OF INFECTION/BREAKDOWN Description No new breakdown or infection with min assist   Outcome: Progressing Goal: RH STG ABLE TO PERFORM INCISION/WOUND CARE W/ASSISTANCE Description STG Able To Perform Incision/Wound Care With Mod I Assistance.  Outcome: Not Met (add Reason)   Problem: RH SAFETY Goal: RH STG ADHERE TO SAFETY PRECAUTIONS W/ASSISTANCE/DEVICE Description STG Adhere to Safety Precautions With Mod I Assistance/Device.  Outcome: Progressing   Problem: RH COGNITION-NURSING Goal: RH STG ANTICIPATES NEEDS/CALLS FOR ASSIST W/ASSIST/CUES Description STG Anticipates Needs/Calls for Assist With Min Assistance/Cues.  Outcome: Progressing   Problem: RH PAIN MANAGEMENT Goal: RH STG PAIN MANAGED AT OR BELOW PT'S PAIN GOAL Description < 3 out of 10.   Outcome: Progressing

## 2018-11-09 NOTE — Progress Notes (Signed)
Physical Therapy Discharge Summary  Patient Details  Name: [name] Hall MRN: [id number] Date of Birth: May 07, 1986  Today's Date: 11/09/2018 PT Individual Time: 0867-6195 PT Individual Time Calculation (min): 38 min   Pt initially upset about food order and recent upgrade in diet.  PT provided support and redirection and pt eventually agreeable to therapy. Pt/family education performed with pt and mother.  Both aware of need for 24 hour supervision and recommendation for HHPT.  Both educated more on brain injury recovery and possible long term outcomes.  Pt and mother both state they feel ready for d/c home tomorrow. Pt performs gait throughout unit with supervision, decreased LOB today despite pt saying "my foot still hurts, just not as bad".  Pt performs balance training exercises on dynavision with static and dynamic balance with improvements in strength, balance and problem solving.  Pt left in room with family present, needs at hand.  Patient has met 6 of 6 long term goals due to improved activity tolerance, improved balance, decreased pain, ability to compensate for deficits and improved awareness.  Patient to discharge at an ambulatory level Supervision.   Patient's care partner is independent to provide the necessary supervision assistance at discharge.  Reasons goals not met: n/a  Recommendation:  Patient will benefit from ongoing skilled PT services in home health setting to continue to advance safe functional mobility, address ongoing impairments in balance, gait, and minimize fall risk.  Equipment: No equipment provided  Reasons for discharge: treatment goals met and discharge from hospital  Patient/family agrees with progress made and goals achieved: Yes  PT Discharge Precautions/Restrictions Precautions Precautions: Fall Restrictions Weight Bearing Restrictions: Yes RLE Weight Bearing: Weight bearing as tolerated Pain Pain Assessment Pain Scale: 0-10 Pain Score: 0-No  pain Faces Pain Scale: Hurts a little bit Pain Type: Acute pain Pain Location: Foot Pain Orientation: Right Pain Descriptors / Indicators: Aching Pain Onset: On-going Pain Intervention(s): Repositioned;Rest  Cognition Overall Cognitive Status: Impaired/Different from baseline Arousal/Alertness: Awake/alert Orientation Level: Oriented X4 Sustained Attention: Appears intact Memory Impairment: Decreased short term memory Awareness: Impaired Awareness Impairment: Anticipatory impairment Safety/Judgment: Impaired [employer] Scales of Cognitive Functioning: Purposeful/appropriate Sensation Sensation Light Touch: Impaired Detail Light Touch Impaired Details: Impaired RUE Proprioception: Appears Intact Coordination Gross Motor Movements are Fluid and Coordinated: Yes Heel Shin Test: intact BLE Motor  Motor Motor: Within Functional Limits  Mobility Bed Mobility Rolling Right: Independent Rolling Left: Independent Supine to Sit: Independent Sit to Supine: Independent Transfers Sit to Stand: Independent Stand to Sit: Independent Stand Pivot Transfers: Independent Locomotion  Gait Ambulation: Yes Gait Assistance: Supervision/Verbal cueing Gait Distance (Feet): 200 Feet Assistive device: None Stairs / Additional Locomotion Stairs: Yes Stairs Assistance: Supervision/Verbal cueing Stair Management Technique: Two rails Number of Stairs: 8 Wheelchair Mobility Wheelchair Mobility: No  Trunk/Postural Assessment  Cervical Assessment Cervical Assessment: Within Functional Limits Thoracic Assessment Thoracic Assessment: Within Functional Limits Lumbar Assessment Lumbar Assessment: Within Functional Limits Postural Control Postural Control: Within Functional Limits  Balance Berg Balance Test Sit to Stand: Able to stand without using hands and stabilize independently Standing Unsupported: Able to stand safely 2 minutes Sitting with Back Unsupported but Feet Supported  on Floor or Stool: Able to sit safely and securely 2 minutes Stand to Sit: Sits safely with minimal use of hands Transfers: Able to transfer safely, minor use of hands Standing Unsupported with Eyes Closed: Able to stand 10 seconds with supervision Standing Ubsupported with Feet Together: Able to place feet together independently and stand 1  minute safely From Standing, Reach Forward with Outstretched Arm: Can reach forward >12 cm safely (5") From Standing Position, Pick up Object from Floor: Able to pick up shoe, needs supervision From Standing Position, Turn to Look Behind Over each Shoulder: Needs supervision when turning Turn 360 Degrees: Needs close supervision or verbal cueing Standing Unsupported, Alternately Place Feet on Step/Stool: Able to complete >2 steps/needs minimal assist Standing Unsupported, One Foot in Front: Able to take small step independently and hold 30 seconds Standing on One Leg: Tries to lift leg/unable to hold 3 seconds but remains standing independently Total Score: 39 Extremity Assessment      RLE Assessment RLE Assessment: Within Functional Limits LLE Assessment LLE Assessment: Within Functional Limits    , 11/09/2018, 11:32 AM

## 2018-11-09 NOTE — Discharge Instructions (Signed)
Inpatient Rehab Discharge Instructions  [name] Hall Discharge date and time: No discharge date for patient encounter.   Activities/Precautions/ Functional Status: Activity: activity as tolerated Diet:  Wound Care: keep wound clean and dry Functional status:  ___ No restrictions     ___ Walk up steps independently ___ 24/7 supervision/assistance   ___ Walk up steps with assistance ___ Intermittent supervision/assistance  ___ Bathe/dress independently ___ Walk with walker     _x__ Bathe/dress with assistance ___ Walk Independently    ___ Shower independently ___ Walk with assistance    ___ Shower with assistance ___ No alcohol     ___ Return to work/school ________   COMMUNITY REFERRALS UPON DISCHARGE:    Home Health:   PT     OT     ST                     Agency:  [facility] Phone: [phone]    GENERAL COMMUNITY RESOURCES FOR PATIENT/FAMILY:  Support Groups:  Brain Injury Support Group (flyer)       Special Instructions: No driving, smoking or alcohol   My questions have been answered and I understand these instructions. I will adhere to these goals and the provided educational materials after my discharge from the hospital.  Patient/Caregiver Signature _______________________________ Date __________  Clinician Signature _______________________________________ Date __________  Please bring this form and your medication list with you to all your follow-up doctor's appointments.

## 2018-11-10 ENCOUNTER — Inpatient Hospital Stay (HOSPITAL_COMMUNITY): Payer: Self-pay | Admitting: Speech Pathology

## 2018-11-10 ENCOUNTER — Inpatient Hospital Stay (HOSPITAL_COMMUNITY): Payer: Self-pay | Admitting: Physical Therapy

## 2018-11-10 ENCOUNTER — Inpatient Hospital Stay (HOSPITAL_COMMUNITY): Payer: Self-pay | Admitting: Occupational Therapy
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
## 2018-11-11 ENCOUNTER — Inpatient Hospital Stay (HOSPITAL_COMMUNITY): Payer: Self-pay | Admitting: Occupational Therapy

## 2018-11-12 ENCOUNTER — Telehealth: Payer: Self-pay

## 2018-11-12 ENCOUNTER — Telehealth: Payer: Self-pay | Admitting: *Deleted

## 2018-11-12 NOTE — Telephone Encounter (Signed)
Transitional Care call-Patient    1. Are you/is patient experiencing any problems since coming home? No Are there any questions regarding any aspect of care? No 2. Are there any questions regarding medications administration/dosing?No Are meds being taken as prescribed? No, has not be able to go to pharmacy yet, planning on getting them today Patient should review meds with caller to confirm 3. Have there been any falls? No 4. Has Home Health been to the house and/or have they contacted you? Yes If not, have you tried to contact them? Can we help you contact them? 5. Are bowels and bladder emptying properly? Yes Are there any unexpected incontinence issues? NoIf applicable, is patient following bowel/bladder programs? 6. Any fevers, problems with breathing, unexpected pain? No  7. Are there any skin problems or new areas of breakdown? No 8. Has the patient/family member arranged specialty MD follow up (ie cardiology/neurology/renal/surgical/etc)? No, planning on calling today Can we help arrange? 9. Does the patient need any other services or support that we can help arrange? No 10. Are caregivers following through as expected in assisting the patient? Yes 11. Has the patient quit smoking, drinking alcohol, or using drugs as recommended? Yes  Appointment time, arrive time 12:40 pm for 1:00 PM with Dr. Riley Kill on 11/21/2018 [address]

## 2018-11-12 NOTE — Progress Notes (Signed)
Social Work  Discharge Note  The overall goal for the admission was met for:   Discharge location: Yes - home with uncle, mother and other family providing 24/7 supervision  Length of Stay: Yes - 4 days  Discharge activity level: Yes - supervision  Home/community participation: Yes  Services provided included: MD, RD, PT, OT, SLP, RN, Pharmacy and [place]: Private Insurance: [employer]  Follow-up services arranged: Home Health: PT, OT, ST via [place] and Patient/Family has no preference for HH/DME agencies  Comments (or additional information):  Contact #s:   Pt @ [phone] Mother, Gwendolyn Grant @ [phone]  Patient/Family verbalized understanding of follow-up arrangements: Yes  Individual responsible for coordination of the follow-up plan: pt/ mother  Confirmed correct DME delivered: NA    Xzavier Swinger

## 2018-11-12 NOTE — Telephone Encounter (Signed)
[name] Hall ST [facility] called to request 2wk 3 to work on cognitive deficits. (approval given).  She also wants Dr Riley Kill to know that he did not have any of his medications and says he is not going to take them.  She did convince them to get the depakote and they said they would.

## 2018-11-12 NOTE — Telephone Encounter (Signed)
Please proceed.   Taking depakote would be better than nnothing

## 2018-11-20 ENCOUNTER — Telehealth: Payer: Self-pay

## 2018-11-20 NOTE — Telephone Encounter (Signed)
Kat OT [facility] called requesting verbal orders for occupational therapy for 2xwk X 3wks.  Called her back and approved verbal orders.

## 2018-11-21 ENCOUNTER — Other Ambulatory Visit: Payer: Self-pay

## 2018-11-21 ENCOUNTER — Encounter: Payer: Self-pay | Admitting: Physical Medicine & Rehabilitation

## 2018-11-21 ENCOUNTER — Encounter
Payer: BLUE CROSS/BLUE SHIELD | Attending: Physical Medicine & Rehabilitation | Admitting: Physical Medicine & Rehabilitation

## 2018-11-21 VITALS — BP 124/78 | HR 76 | Ht 71.0 in | Wt 165.8 lb

## 2018-11-21 DIAGNOSIS — S069X0S Unspecified intracranial injury without loss of consciousness, sequela: Secondary | ICD-10-CM | POA: Insufficient documentation

## 2018-11-21 DIAGNOSIS — S5411XS Injury of median nerve at forearm level, right arm, sequela: Secondary | ICD-10-CM

## 2018-11-21 DIAGNOSIS — R4689 Other symptoms and signs involving appearance and behavior: Secondary | ICD-10-CM | POA: Diagnosis present

## 2018-11-21 NOTE — Patient Instructions (Addendum)
SEE IF HOME HEALTH CAN HAVE OCCUPATIONAL THERAPY COME OUT TO WORK ON YOUR LEFT HAND.    PRACTICE THE EYE EXERCISES I'VE GIVEN YOU   NO DRIVING
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
## 2018-12-07 ENCOUNTER — Telehealth: Payer: Self-pay | Admitting: *Deleted

## 2018-12-07 DIAGNOSIS — R4689 Other symptoms and signs involving appearance and behavior: Secondary | ICD-10-CM

## 2018-12-07 DIAGNOSIS — S5411XS Injury of median nerve at forearm level, right arm, sequela: Secondary | ICD-10-CM

## 2018-12-07 DIAGNOSIS — S069X0S Unspecified intracranial injury without loss of consciousness, sequela: Secondary | ICD-10-CM

## 2018-12-07 NOTE — Telephone Encounter (Signed)
Kat OT [facility] called to let us know he is being discharged from [facility] and he is ready for outpt therapy OT right hand and upper arm and PT vertigo to [facility] out[facility] Re[facility].  Orders placed

## 2018-12-17 ENCOUNTER — Encounter (HOSPITAL_COMMUNITY): Payer: Self-pay

## 2018-12-17 ENCOUNTER — Other Ambulatory Visit: Payer: Self-pay

## 2018-12-17 ENCOUNTER — Ambulatory Visit (HOSPITAL_COMMUNITY): Payer: BLUE CROSS/BLUE SHIELD

## 2018-12-17 ENCOUNTER — Ambulatory Visit (HOSPITAL_COMMUNITY): Payer: BLUE CROSS/BLUE SHIELD | Attending: Physical Medicine & Rehabilitation

## 2018-12-17 DIAGNOSIS — R42 Dizziness and giddiness: Secondary | ICD-10-CM

## 2018-12-17 DIAGNOSIS — R208 Other disturbances of skin sensation: Secondary | ICD-10-CM | POA: Insufficient documentation

## 2018-12-17 DIAGNOSIS — M79641 Pain in right hand: Secondary | ICD-10-CM | POA: Diagnosis not present

## 2018-12-17 DIAGNOSIS — H8111 Benign paroxysmal vertigo, right ear: Secondary | ICD-10-CM

## 2018-12-17 DIAGNOSIS — R278 Other lack of coordination: Secondary | ICD-10-CM | POA: Insufficient documentation
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
## 2018-12-17 NOTE — Patient Instructions (Signed)
Home Exercises Program Theraputty Exercises  Do the following exercises 2-3 times a day using your affected hand.  1. Roll putty into a ball.  2. Make into a pancake.  3. Roll putty into a roll.  4. Pinch along log with first finger and thumb.   5. Make into a ball.  6. Roll it back into a log.   7. Pinch using thumb and side of first finger.  8. Roll into a ball, then flatten into a pancake.  9. Using your fingers, make putty into a mountain.     Coordination Activities  Perform the following activities for 15-30 minutes 2-3 times per day with right hand(s).  Rotate ball in fingertips (clockwise and counter-clockwise). Flip cards 1 at a time as fast as you can. Deal cards with your thumb (Hold deck in hand and push card off top with thumb). Rotate card in hand (clockwise and counter-clockwise). Shuffle cards. Pick up coins, buttons, marbles, dried beans/pasta of different sizes and place in container. Pick up coins one at a time until you get 5-10 in your hand, then move coins from palm to fingertips to stack one at a time. Twirl pen between fingers. Practice writing and/or typing.   DESENSITIZING YOUR HAND  What is "desnesitization"?  Following an injury to the hand a painful increase in sensation sometimes develops.  This hypersensitivity may be in response to touch, cold temperatures or in severe cases, even to wind blowing on the hand.  This hypersensitivity may be localized to a small area or may be widespread over an entire hand.  The initial injury causing the hypersensitivity may either be a rather severe injury but may times is only a trivial injury.  Desensitization is the process of decreasing the painful hypersensitivity by gradually exposing the sensitive area to a variety of carefully selected stimuli.  What will happen if my hand is not desensitized?  Over-protecting the sensitive are by not using it or covering it with a bandage or glove will only cause  the sensitive area to become more painful to touch.  This, in turn, leads to increased stiffness, weakness and loss of use in the hand.  How do I desensitize my hand?  The goal of desensitization is to gradually build up a tolerance to stronger and stronger stimuli as the level of pain decreases.  A variety of objects of different textures are used to rub against the sensitive hand in such a way that the coarseness of the textures and the rubbing time are gradually increased as the pain diminishes over a period of days and weeks.  The following steps have been set up by your hand therapist and surgeon to serve as a general guide during your therapy and to help you better understand the proper techniques involved so that ultimately a more pain-free, useful hand is obtained.  1. Warm (not hot) water soaks or heating pad on a low setting. 2. Lotion 3. Rubbing exercises:  Using your normal hand, gently rub the most hypersensitive area with a back and forth motion for 30 seconds, rest briefly, and then repeat.  As tolerance to rubbing improves, slowly increase the pressure of rubbing one hand against the other.  Gradually work up to a deep circular massage.  Once deep massage is tolerated, advance to gently rubbing the sensitive hand against selected materials with increasingly rougher surfaces as indicated below.  It is important to begin with soft, smooth textures and gradually work up to rough, hard  textures.  Suggested sequence of desensitization materials: 1)  Cotton balls  2)  Flannel shirt or cotton shirt  3)  Soft velvet or felt  4)  Pants  5)  Towel  6)  Upholstery or rug 7)  Sand paper   Submerge hand into containers of the following household materials and slowly move your fingers about, grasping the materials and releasing them, it is beneficial to submerge the hand quickly and then pull it back out... Work on increasing speed!  The following materials are recommended: cotton balls,  popcorn, rice, macaroni, beans or sand.  Rub a brush along the hand.  Massage the hand with vibrating instruments such as an Neurosurgeon, electric toothbrush or vibrator.  Tapping exercises: finger can be tapped against the various textures listed above.  Gradually increase the speed and force used with tapping.  To improve your fine motor coordination and to encourage normal use of the hand the following exercises are useful: 1.  Nuts and bolts 2.  Clothes pins 3.  Picking up  4.  Using a typewriter  5.  Piano  Avoid 1. Hot water.  This may increase swelling and cause more pain and stiffness. 2. Painful activities.  Desensitization may be somewhat uncomfortable, but it should not actually be painful. 3. Over-protection. Again, covering the sensitive area or avoiding use of the painful finger or hand will only increase painful sensitivity.  Therefore, excessive pain, heat and over-protection may do more harm than good.  You should not experience increased pain and swelling following your desensitization exercises.  Your therapist or surgeon will tailor the protocol to help meet your specific needs.  Do not hesitate to ask them any questions that you may have.  USE THE HAND AS NORMALLY AS POSSIBLE
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
## 2018-12-18 ENCOUNTER — Encounter (HOSPITAL_COMMUNITY): Payer: Self-pay

## 2018-12-18 ENCOUNTER — Ambulatory Visit (HOSPITAL_COMMUNITY): Payer: BLUE CROSS/BLUE SHIELD

## 2018-12-18 DIAGNOSIS — H8111 Benign paroxysmal vertigo, right ear: Secondary | ICD-10-CM

## 2018-12-18 DIAGNOSIS — M79641 Pain in right hand: Secondary | ICD-10-CM | POA: Diagnosis not present

## 2018-12-18 DIAGNOSIS — R42 Dizziness and giddiness: Secondary | ICD-10-CM
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
## 2018-12-19 ENCOUNTER — Ambulatory Visit: Payer: Self-pay | Admitting: Vascular Surgery

## 2018-12-19 ENCOUNTER — Other Ambulatory Visit: Payer: Self-pay

## 2018-12-19 ENCOUNTER — Encounter: Payer: Self-pay | Admitting: Vascular Surgery

## 2018-12-19 VITALS — BP 123/78 | HR 72 | Temp 97.5°F | Resp 16 | Ht 71.0 in | Wt 170.6 lb

## 2018-12-19 DIAGNOSIS — Z48812 Encounter for surgical aftercare following surgery on the circulatory system: Secondary | ICD-10-CM
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
## 2018-12-25 ENCOUNTER — Telehealth (HOSPITAL_COMMUNITY): Payer: Self-pay | Admitting: Physician Assistant

## 2018-12-25 NOTE — Telephone Encounter (Signed)
12/25/18  Pt called to cx but no reason was given

## 2018-12-26 ENCOUNTER — Encounter
Payer: BLUE CROSS/BLUE SHIELD | Attending: Physical Medicine & Rehabilitation | Admitting: Physical Medicine & Rehabilitation

## 2018-12-26 ENCOUNTER — Encounter: Payer: Self-pay | Admitting: Physical Medicine & Rehabilitation

## 2018-12-26 ENCOUNTER — Ambulatory Visit (HOSPITAL_COMMUNITY): Payer: BLUE CROSS/BLUE SHIELD

## 2018-12-26 VITALS — BP 123/78 | HR 70 | Ht 70.5 in | Wt 166.8 lb

## 2018-12-26 DIAGNOSIS — S5411XS Injury of median nerve at forearm level, right arm, sequela: Secondary | ICD-10-CM | POA: Insufficient documentation

## 2018-12-26 DIAGNOSIS — S069X2S Unspecified intracranial injury with loss of consciousness of 31 minutes to 59 minutes, sequela: Secondary | ICD-10-CM

## 2018-12-26 DIAGNOSIS — R4689 Other symptoms and signs involving appearance and behavior: Secondary | ICD-10-CM | POA: Diagnosis present

## 2018-12-26 DIAGNOSIS — S069X0S Unspecified intracranial injury without loss of consciousness, sequela: Secondary | ICD-10-CM | POA: Insufficient documentation
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
## 2018-12-26 NOTE — Patient Instructions (Signed)
PLEASE FEEL FREE TO CALL OUR OFFICE WITH ANY PROBLEMS OR QUESTIONS ([phone])      

## 2018-12-28 ENCOUNTER — Ambulatory Visit (HOSPITAL_COMMUNITY): Payer: BLUE CROSS/BLUE SHIELD

## 2018-12-28 ENCOUNTER — Encounter (HOSPITAL_COMMUNITY): Payer: Self-pay

## 2018-12-28 NOTE — Therapy (Signed)
[place], [place], [address] Phone: [phone]   Fax:  [phone]  Patient Details  Name: BARRET ESQUIVEL MRN: [id number] Date of Birth: May 05, 1986 Referring Provider:  No ref. provider found  Encounter Date: 12/28/2018  PHYSICAL THERAPY DISCHARGE SUMMARY  Visits from Start of Care: 2  Current functional level related to goals / functional outcomes: See last note   Remaining deficits: See last note   Education / Equipment: n/a  Plan: Patient agrees to discharge.  Patient goals were met. Patient is being discharged due to the patient's request.  ?????     Geraldine Solar PT, [place] [address] [place], [place], [address] Phone: [phone]   Fax:  [phone]

## 2019-01-02 ENCOUNTER — Ambulatory Visit (HOSPITAL_COMMUNITY): Payer: BLUE CROSS/BLUE SHIELD

## 2019-01-04 ENCOUNTER — Ambulatory Visit (HOSPITAL_COMMUNITY): Payer: BLUE CROSS/BLUE SHIELD

## 2019-01-07 ENCOUNTER — Ambulatory Visit (HOSPITAL_COMMUNITY): Payer: BLUE CROSS/BLUE SHIELD

## 2019-01-09 ENCOUNTER — Ambulatory Visit (HOSPITAL_COMMUNITY): Payer: Self-pay

## 2019-02-19 ENCOUNTER — Ambulatory Visit: Payer: Self-pay | Admitting: Physical Medicine & Rehabilitation

## 2019-04-02 ENCOUNTER — Encounter
Payer: BC Managed Care – PPO | Attending: Physical Medicine & Rehabilitation | Admitting: Physical Medicine & Rehabilitation

## 2019-04-02 DIAGNOSIS — R4689 Other symptoms and signs involving appearance and behavior: Secondary | ICD-10-CM | POA: Insufficient documentation

## 2019-04-02 DIAGNOSIS — S069X0S Unspecified intracranial injury without loss of consciousness, sequela: Secondary | ICD-10-CM | POA: Insufficient documentation

## 2019-04-02 DIAGNOSIS — S5411XS Injury of median nerve at forearm level, right arm, sequela: Secondary | ICD-10-CM | POA: Insufficient documentation

## 2019-09-25 ENCOUNTER — Encounter: Payer: Self-pay | Admitting: Physical Medicine & Rehabilitation

## 2019-09-25 ENCOUNTER — Encounter
Payer: BC Managed Care – PPO | Attending: Physical Medicine & Rehabilitation | Admitting: Physical Medicine & Rehabilitation

## 2019-09-25 ENCOUNTER — Other Ambulatory Visit: Payer: Self-pay

## 2019-09-25 VITALS — BP 109/75 | HR 85 | Temp 98.1°F | Ht 70.0 in | Wt 163.6 lb

## 2019-09-25 DIAGNOSIS — S93111S Dislocation of interphalangeal joint of right great toe, sequela: Secondary | ICD-10-CM | POA: Diagnosis not present

## 2019-09-25 DIAGNOSIS — S069X3S Unspecified intracranial injury with loss of consciousness of 1 hour to 5 hours 59 minutes, sequela: Secondary | ICD-10-CM | POA: Diagnosis not present

## 2019-09-25 DIAGNOSIS — R4689 Other symptoms and signs involving appearance and behavior: Secondary | ICD-10-CM | POA: Diagnosis not present

## 2019-09-25 DIAGNOSIS — S069X0S Unspecified intracranial injury without loss of consciousness, sequela: Secondary | ICD-10-CM | POA: Diagnosis not present

## 2019-09-25 MED ORDER — QUETIAPINE FUMARATE 50 MG PO TABS: 50.0000 mg | ORAL_TABLET | Freq: Every day | ORAL | 2 refills | Status: DC

## 2019-09-25 MED ORDER — DIVALPROEX SODIUM 500 MG PO DR TAB: 500.0000 mg | DELAYED_RELEASE_TABLET | Freq: Two times a day (BID) | ORAL | 4 refills | Status: DC
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
## 2019-09-25 NOTE — Patient Instructions (Addendum)
TAKE DEPAKOTE 500MG  ONCE AT DINNER FOR 5 DAYS THEN TWICE DAILY THEREAFTER.    TRY IBUPROFEN 400MG  EVERY 6 HOURS AS NEEDED FOR TOE PAIN.

## 2019-10-18 ENCOUNTER — Other Ambulatory Visit: Payer: Self-pay | Admitting: Physical Medicine & Rehabilitation

## 2019-10-18 DIAGNOSIS — R4689 Other symptoms and signs involving appearance and behavior: Secondary | ICD-10-CM

## 2019-10-29 IMAGING — DX DG CHEST 2V
3 series · 3 of 3 positions shown · non-contrast
Comparison: 07/08/2017

CLINICAL DATA: Pain after a fall.  Drinking a lot tonight.

EXAM:
CHEST  2 VIEW

[chest lat]
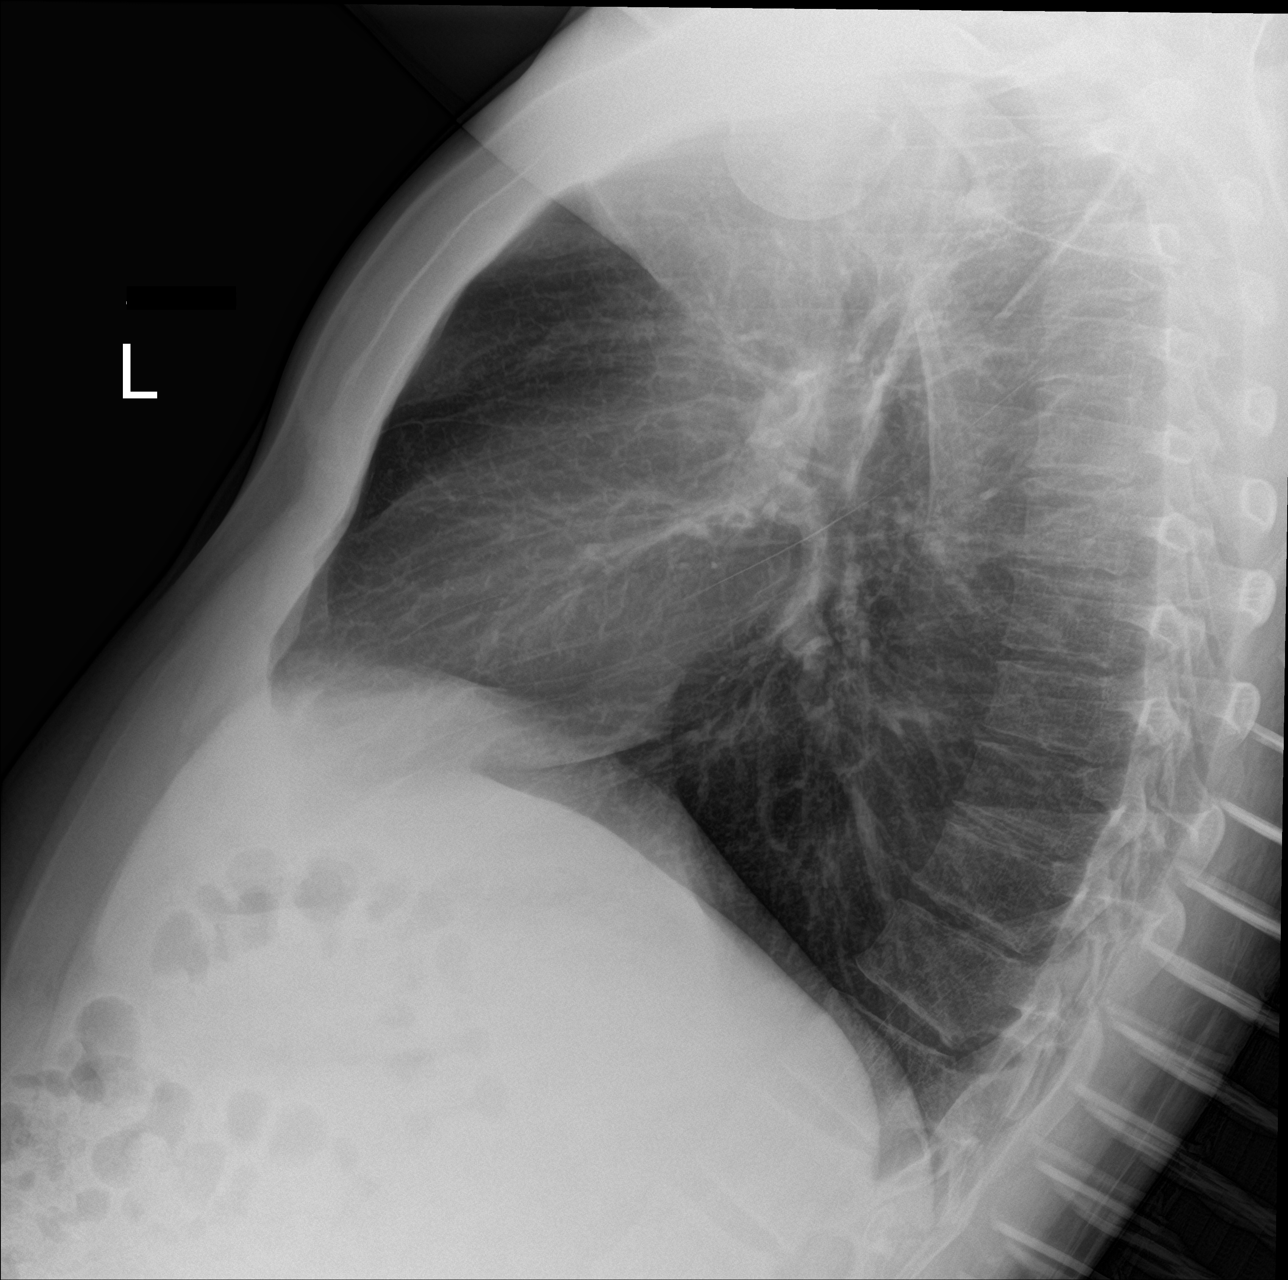

[chest ap (1 of 2)]
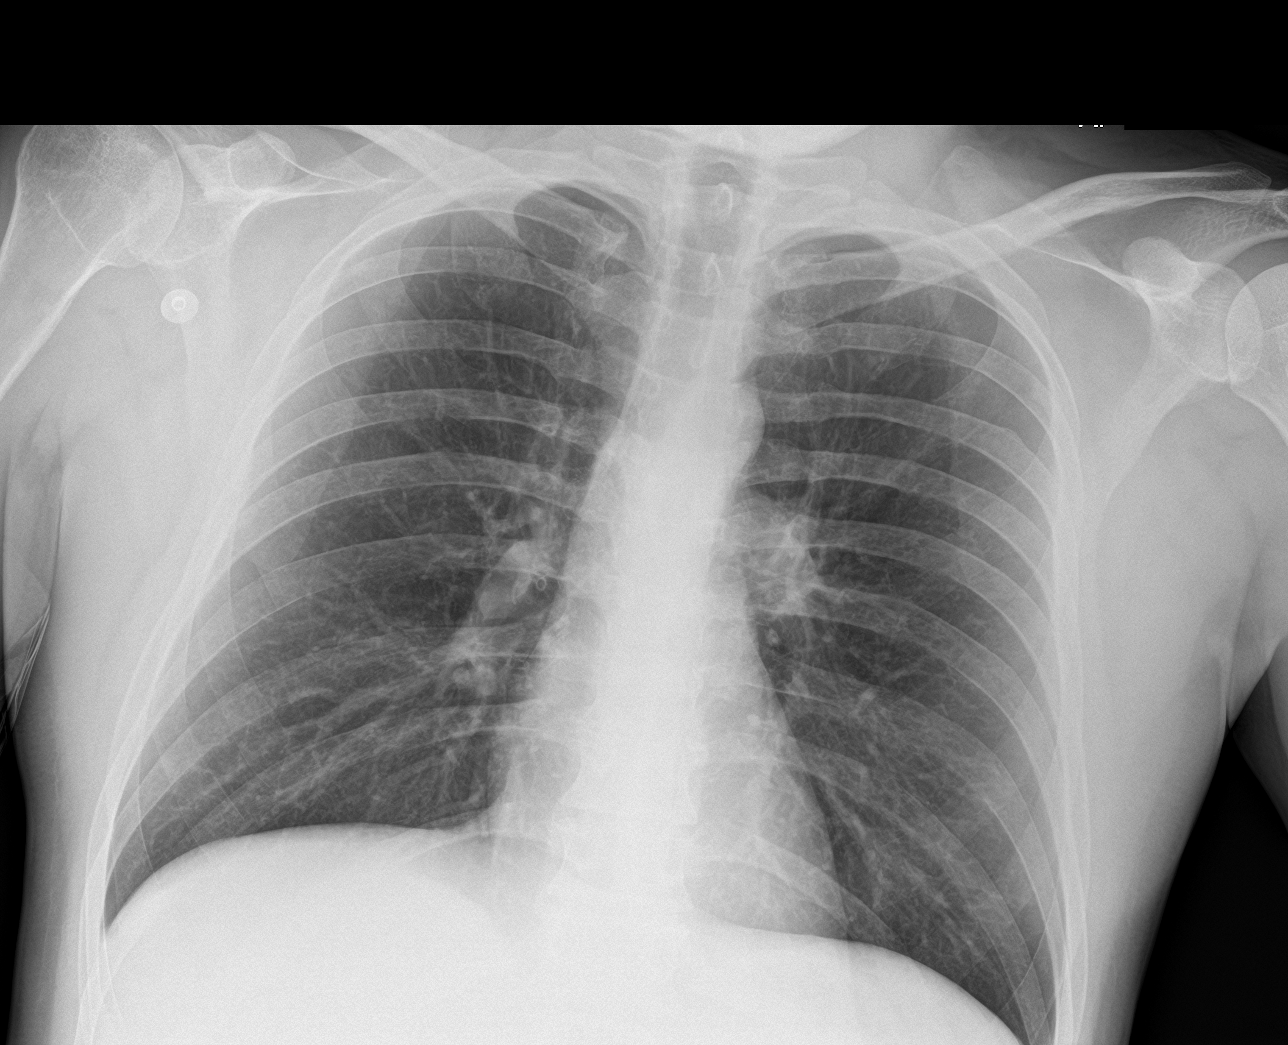

[chest ap (2 of 2)]
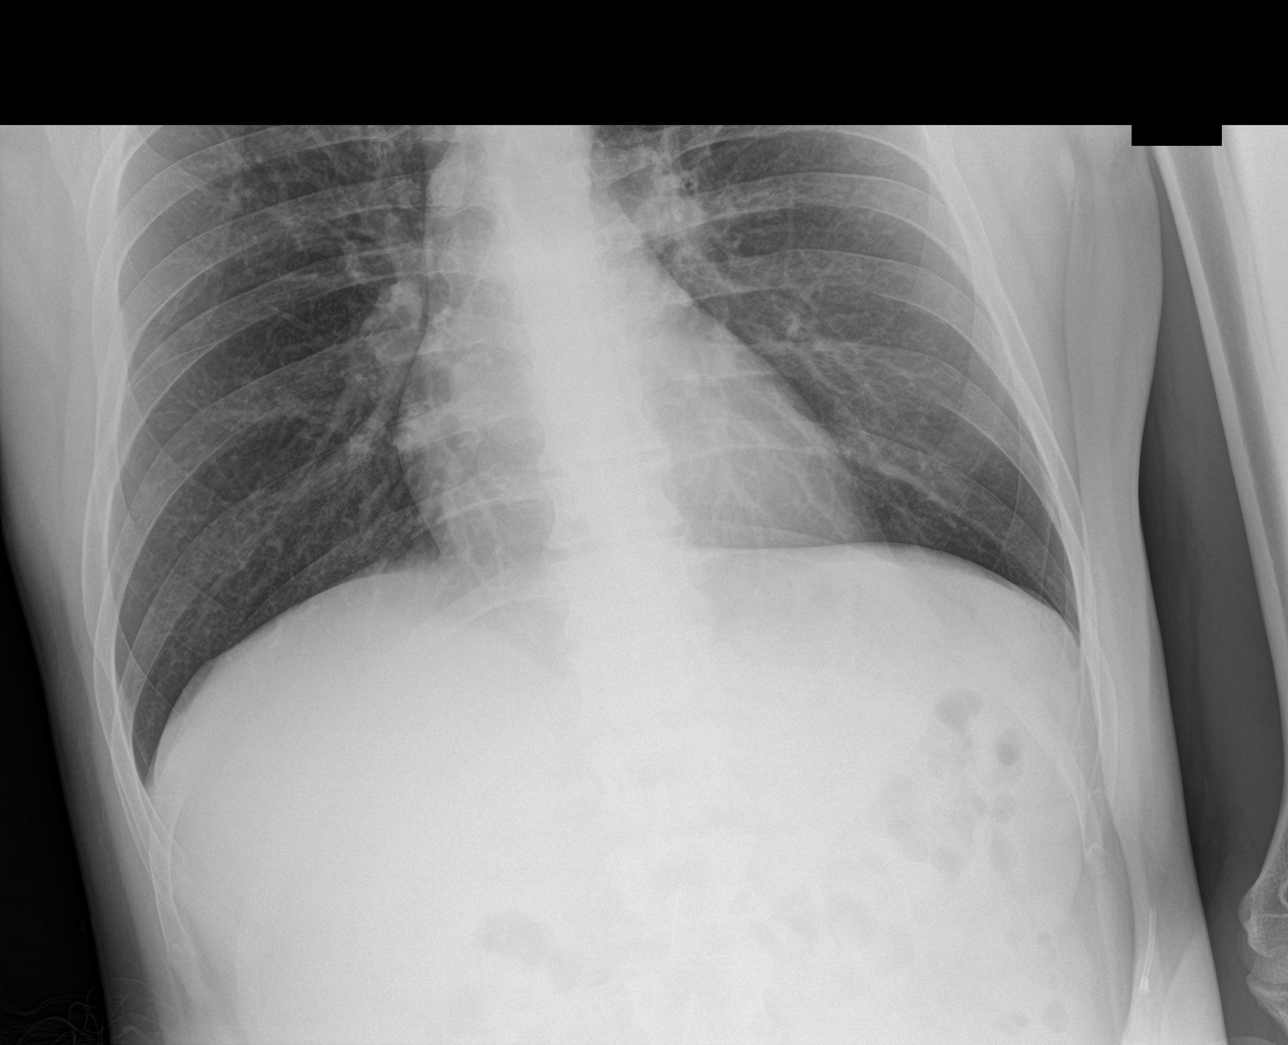

[3 of 3 positions shown; findings below may reference images not displayed]

FINDINGS: The heart size and mediastinal contours are within normal limits.
Both lungs are clear. The visualized skeletal structures are
unremarkable.
IMPRESSION: No active cardiopulmonary disease.

## 2019-10-29 IMAGING — DX DG SHOULDER 2+V*L*
3 series · 3 of 3 positions shown · non-contrast
Comparison: None.

CLINICAL DATA: Pain after fall tonight.  Heavy drinking.

EXAM:
LEFT SHOULDER - 2+ VIEW

[shoulder grashey (1 of 2)]
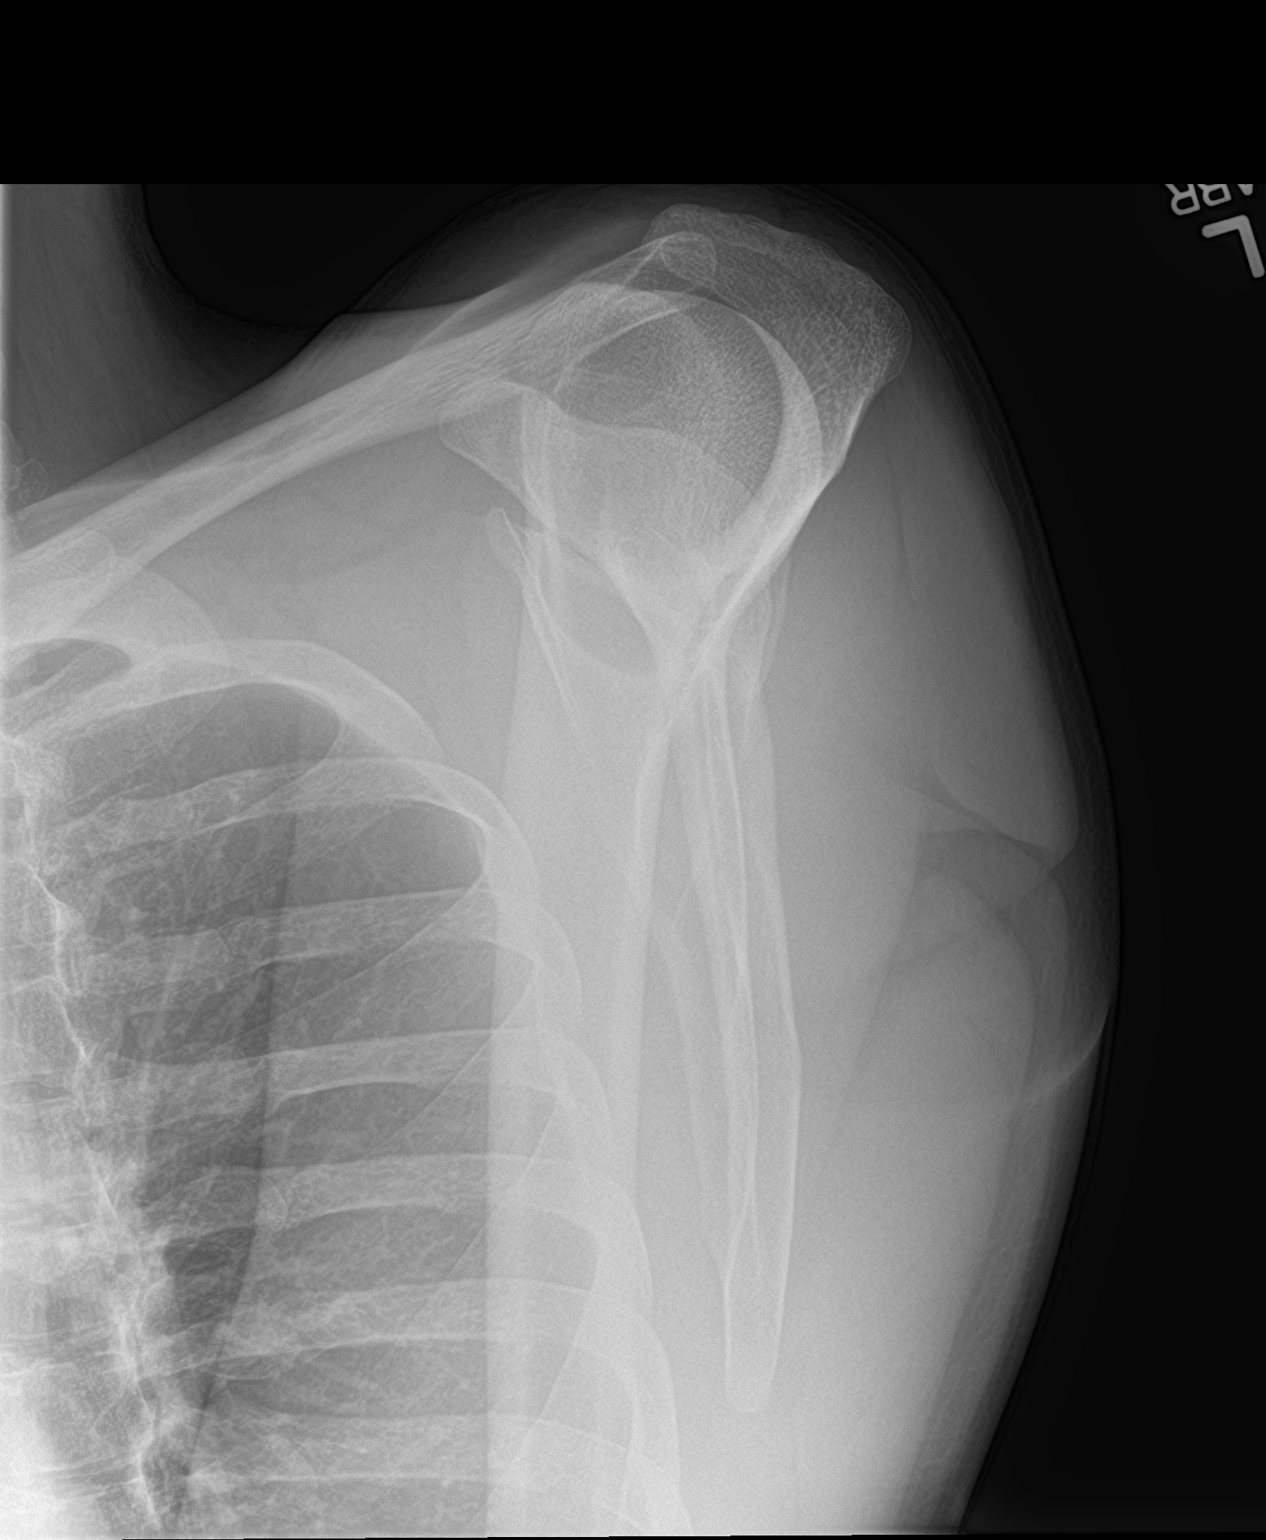

[shoulder axillary]
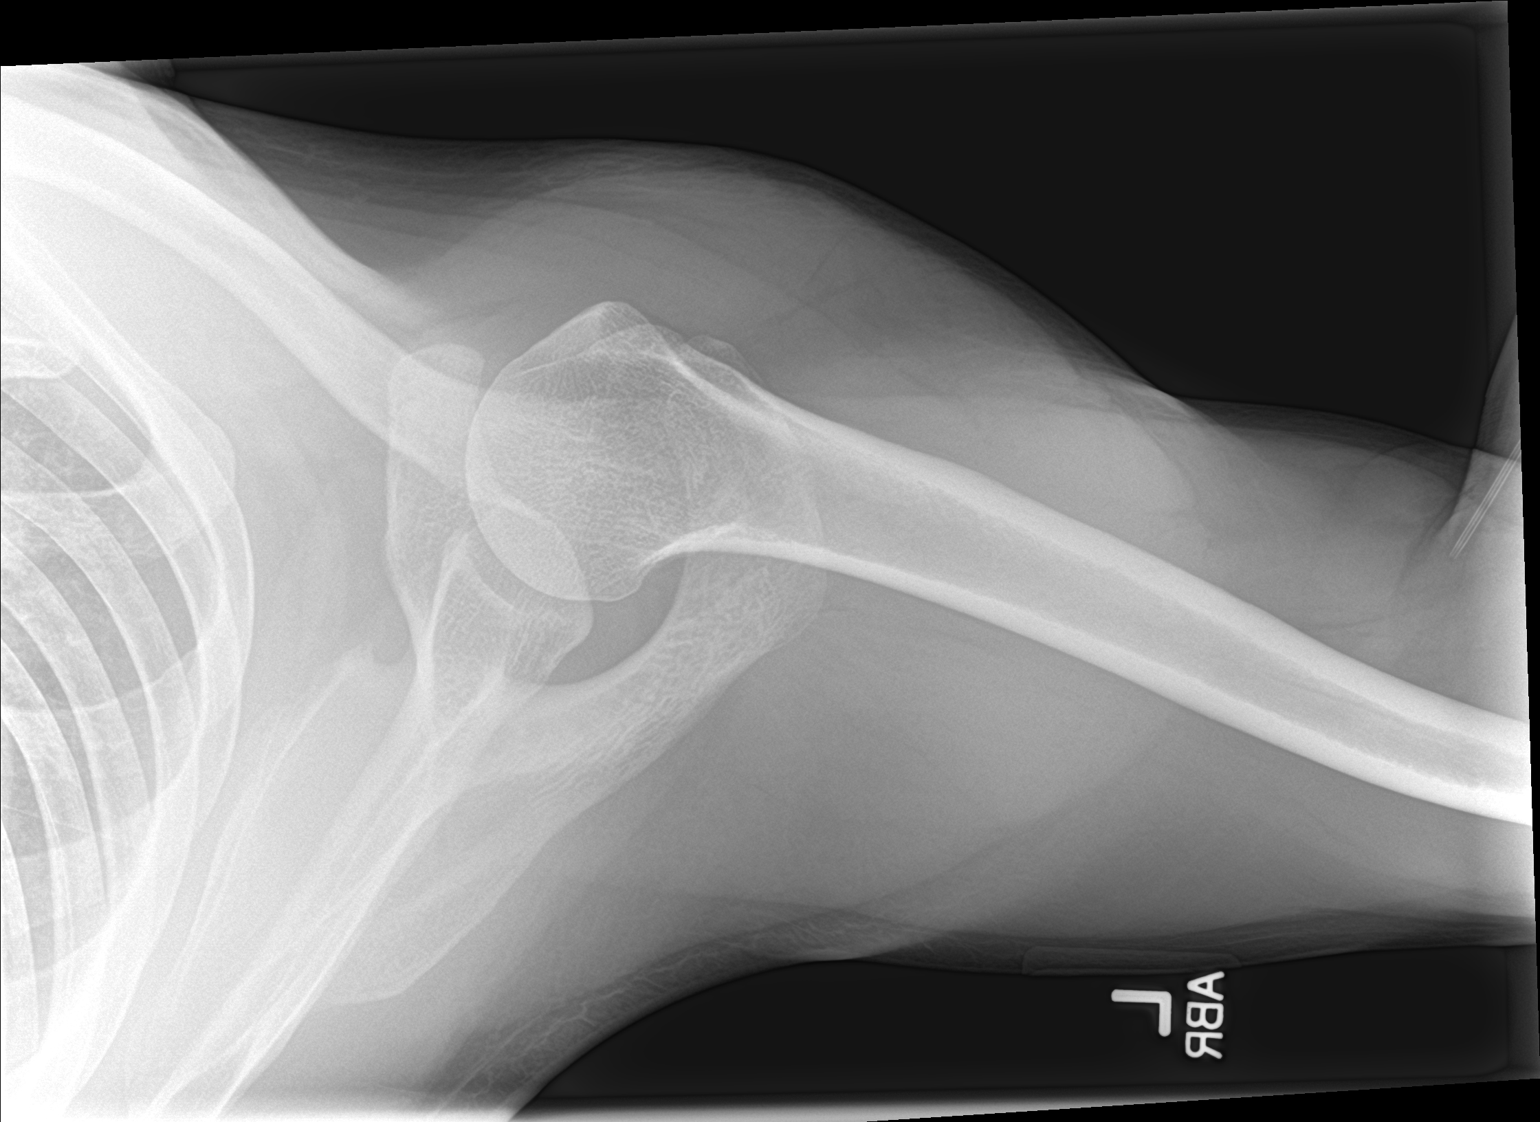

[shoulder grashey (2 of 2)]
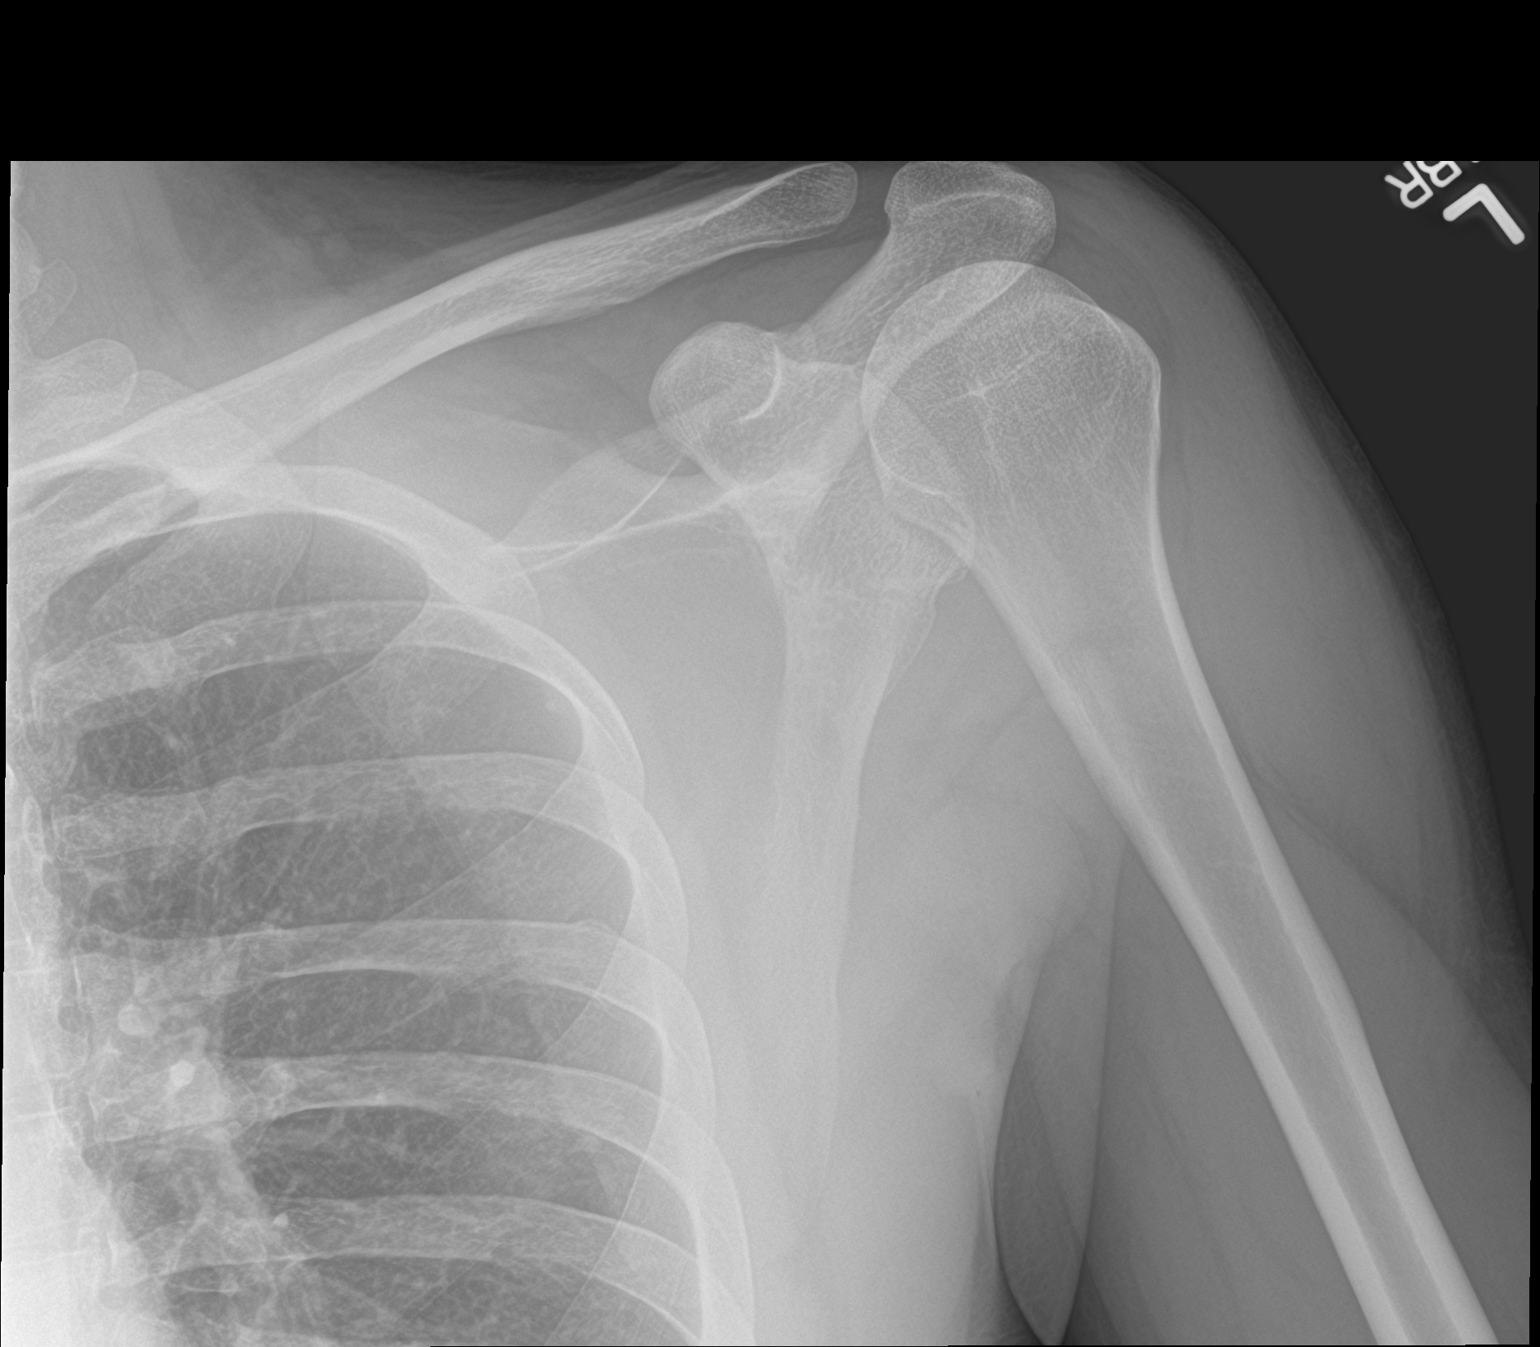

[3 of 3 positions shown; findings below may reference images not displayed]

FINDINGS: There is no evidence of fracture or dislocation. There is no
evidence of arthropathy or other focal bone abnormality. Soft
tissues are unremarkable.
IMPRESSION: Negative.

## 2019-11-06 ENCOUNTER — Encounter: Payer: BC Managed Care – PPO | Admitting: Physical Medicine & Rehabilitation

## 2019-11-14 ENCOUNTER — Ambulatory Visit (HOSPITAL_COMMUNITY)
Admission: RE | Admit: 2019-11-14 | Discharge: 2019-11-14 | Disposition: A | Discharge status: Home / Self Care | Payer: BC Managed Care – PPO | Source: Ambulatory Visit | Attending: Physical Medicine & Rehabilitation | Admitting: Physical Medicine & Rehabilitation

## 2019-11-14 ENCOUNTER — Encounter (HOSPITAL_COMMUNITY): Payer: Self-pay

## 2019-11-14 ENCOUNTER — Other Ambulatory Visit: Payer: Self-pay

## 2019-11-14 DIAGNOSIS — S93111S Dislocation of interphalangeal joint of right great toe, sequela: Secondary | ICD-10-CM

## 2019-11-27 ENCOUNTER — Encounter: Payer: Self-pay | Admitting: Physical Medicine & Rehabilitation

## 2019-11-27 ENCOUNTER — Other Ambulatory Visit: Payer: Self-pay

## 2019-11-27 ENCOUNTER — Encounter
Payer: BC Managed Care – PPO | Attending: Physical Medicine & Rehabilitation | Admitting: Physical Medicine & Rehabilitation

## 2019-11-27 VITALS — BP 131/84 | HR 117 | Temp 97.8°F | Ht 71.0 in | Wt 163.0 lb

## 2019-11-27 DIAGNOSIS — S069X3S Unspecified intracranial injury with loss of consciousness of 1 hour to 5 hours 59 minutes, sequela: Secondary | ICD-10-CM | POA: Diagnosis present

## 2019-11-27 DIAGNOSIS — S069X0S Unspecified intracranial injury without loss of consciousness, sequela: Secondary | ICD-10-CM | POA: Diagnosis present

## 2019-11-27 DIAGNOSIS — S93111S Dislocation of interphalangeal joint of right great toe, sequela: Secondary | ICD-10-CM | POA: Insufficient documentation

## 2019-11-27 DIAGNOSIS — R4689 Other symptoms and signs involving appearance and behavior: Secondary | ICD-10-CM | POA: Diagnosis not present

## 2019-11-27 MED ORDER — CYCLOBENZAPRINE HCL 5 MG PO TABS: 5.0000 mg | ORAL_TABLET | Freq: Three times a day (TID) | ORAL | 1 refills | Status: DC | PRN

## 2019-11-27 NOTE — Patient Instructions (Signed)
TRY SLEEPING ON YOUR BACK. KEEP YOUR ARMS STRAIGHT IF POSSIBLE WHEN YOU REST  CONSIDER WEARING WRIST SPLINTS AT NIGHT.
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
## 2019-12-18 ENCOUNTER — Other Ambulatory Visit: Payer: Self-pay | Admitting: Physical Medicine & Rehabilitation

## 2019-12-18 DIAGNOSIS — R4689 Other symptoms and signs involving appearance and behavior: Secondary | ICD-10-CM

## 2019-12-18 DIAGNOSIS — S069X0S Unspecified intracranial injury without loss of consciousness, sequela: Secondary | ICD-10-CM

## 2019-12-31 ENCOUNTER — Other Ambulatory Visit: Payer: Self-pay | Admitting: *Deleted

## 2019-12-31 DIAGNOSIS — Z48812 Encounter for surgical aftercare following surgery on the circulatory system: Secondary | ICD-10-CM

## 2020-01-01 ENCOUNTER — Ambulatory Visit (HOSPITAL_COMMUNITY)
Admission: RE | Admit: 2020-01-01 | Discharge: 2020-01-01 | Disposition: A | Discharge status: Home / Self Care | Payer: Self-pay | Source: Ambulatory Visit | Attending: Vascular Surgery | Admitting: Vascular Surgery

## 2020-01-01 ENCOUNTER — Ambulatory Visit (INDEPENDENT_AMBULATORY_CARE_PROVIDER_SITE_OTHER): Payer: Self-pay | Admitting: Vascular Surgery

## 2020-01-01 ENCOUNTER — Other Ambulatory Visit: Payer: Self-pay

## 2020-01-01 ENCOUNTER — Encounter: Payer: Self-pay | Admitting: Vascular Surgery

## 2020-01-01 VITALS — BP 128/82 | HR 90 | Temp 97.7°F | Resp 20 | Ht 71.0 in | Wt 159.0 lb

## 2020-01-01 DIAGNOSIS — Z48812 Encounter for surgical aftercare following surgery on the circulatory system: Secondary | ICD-10-CM
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
## 2020-01-21 ENCOUNTER — Other Ambulatory Visit: Payer: Self-pay | Admitting: Physical Medicine & Rehabilitation

## 2020-01-21 DIAGNOSIS — R4689 Other symptoms and signs involving appearance and behavior: Secondary | ICD-10-CM

## 2020-02-26 ENCOUNTER — Encounter: Payer: Self-pay | Admitting: Physical Medicine & Rehabilitation

## 2020-02-26 ENCOUNTER — Other Ambulatory Visit: Payer: Self-pay

## 2020-02-26 ENCOUNTER — Encounter: Payer: Self-pay | Attending: Physical Medicine & Rehabilitation | Admitting: Physical Medicine & Rehabilitation

## 2020-02-26 VITALS — BP 126/87 | HR 86 | Temp 97.2°F | Ht 71.0 in | Wt 160.6 lb

## 2020-02-26 DIAGNOSIS — S069X0S Unspecified intracranial injury without loss of consciousness, sequela: Secondary | ICD-10-CM | POA: Insufficient documentation

## 2020-02-26 DIAGNOSIS — S5411XS Injury of median nerve at forearm level, right arm, sequela: Secondary | ICD-10-CM | POA: Insufficient documentation

## 2020-02-26 DIAGNOSIS — S93111S Dislocation of interphalangeal joint of right great toe, sequela: Secondary | ICD-10-CM | POA: Insufficient documentation

## 2020-02-26 DIAGNOSIS — R4689 Other symptoms and signs involving appearance and behavior: Secondary | ICD-10-CM | POA: Insufficient documentation
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
## 2020-02-26 NOTE — Patient Instructions (Signed)
PLEASE FEEL FREE TO CALL OUR OFFICE WITH ANY PROBLEMS OR QUESTIONS ([phone])      

## 2020-03-30 ENCOUNTER — Emergency Department (HOSPITAL_COMMUNITY): Payer: Self-pay

## 2020-03-30 ENCOUNTER — Other Ambulatory Visit: Payer: Self-pay

## 2020-03-30 ENCOUNTER — Emergency Department (HOSPITAL_COMMUNITY)
Admission: EM | Admit: 2020-03-30 | Discharge: 2020-03-30 | Disposition: A | Discharge status: Home / Self Care | Payer: Self-pay | Attending: Emergency Medicine | Admitting: Emergency Medicine

## 2020-03-30 ENCOUNTER — Encounter (HOSPITAL_COMMUNITY): Payer: Self-pay | Admitting: *Deleted

## 2020-03-30 DIAGNOSIS — Z20822 Contact with and (suspected) exposure to covid-19: Secondary | ICD-10-CM | POA: Insufficient documentation

## 2020-03-30 DIAGNOSIS — Z87891 Personal history of nicotine dependence: Secondary | ICD-10-CM | POA: Insufficient documentation

## 2020-03-30 DIAGNOSIS — R911 Solitary pulmonary nodule: Secondary | ICD-10-CM | POA: Insufficient documentation

## 2020-03-30 DIAGNOSIS — J4551 Severe persistent asthma with (acute) exacerbation: Secondary | ICD-10-CM | POA: Insufficient documentation

## 2020-03-30 LAB — COMPREHENSIVE METABOLIC PANEL
ALT: 16 U/L (ref 0–44)
AST: 20 U/L (ref 15–41)
Albumin: 4.7 g/dL (ref 3.5–5.0)
Alkaline Phosphatase: 74 U/L (ref 38–126)
Anion gap: 13 (ref 5–15)
BUN: 19 mg/dL (ref 6–20)
CO2: 22 mmol/L (ref 22–32)
Calcium: 9.9 mg/dL (ref 8.9–10.3)
Chloride: 103 mmol/L (ref 98–111)
Creatinine, Ser: 0.87 mg/dL (ref 0.61–1.24)
GFR calc Af Amer: >60 mL/min (ref >60)
GFR calc non Af Amer: >60 mL/min (ref >60)
Glucose, Bld: 105 mg/dL — ABNORMAL HIGH (ref 70–99)
Potassium: 3.7 mmol/L (ref 3.5–5.1)
Sodium: 138 mmol/L (ref 135–145)
Total Bilirubin: 1 mg/dL (ref 0.3–1.2)
Total Protein: 8.1 g/dL (ref 6.5–8.1)

## 2020-03-30 LAB — CBC WITH DIFFERENTIAL/PLATELET
Abs Immature Granulocytes: 0.23 10*3/uL — ABNORMAL HIGH (ref 0.00–0.07)
Basophils Absolute: 0 10*3/uL (ref 0.0–0.1)
Basophils Relative: 0 %
Eosinophils Absolute: 0.2 10*3/uL (ref 0.0–0.5)
Eosinophils Relative: 1 %
HCT: 48.5 % (ref 39.0–52.0)
Hemoglobin: 16.5 g/dL (ref 13.0–17.0)
Immature Granulocytes: 2 %
Lymphocytes Relative: 6 %
Lymphs Abs: 1 10*3/uL (ref 0.7–4.0)
MCH: 29.4 pg (ref 26.0–34.0)
MCHC: 34 g/dL (ref 30.0–36.0)
MCV: 86.3 fL (ref 80.0–100.0)
Monocytes Absolute: 1.7 10*3/uL — ABNORMAL HIGH (ref 0.1–1.0)
Monocytes Relative: 11 %
Neutro Abs: 12.6 10*3/uL — ABNORMAL HIGH (ref 1.7–7.7)
Neutrophils Relative %: 80 %
Platelets: 367 10*3/uL (ref 150–400)
RBC: 5.62 MIL/uL (ref 4.22–5.81)
RDW: 12.5 % (ref 11.5–15.5)
WBC: 15.8 10*3/uL — ABNORMAL HIGH (ref 4.0–10.5)
nRBC: 0 % (ref 0.0–0.2)

## 2020-03-30 LAB — D-DIMER, QUANTITATIVE: D-Dimer, Quant: <0.27 ug/mL-FEU (ref 0.00–0.50)

## 2020-03-30 LAB — TROPONIN I (HIGH SENSITIVITY)
Troponin I (High Sensitivity): 5 ng/L (ref <18)
Troponin I (High Sensitivity): 5 ng/L (ref <18)

## 2020-03-30 LAB — SARS CORONAVIRUS 2 BY RT PCR (HOSPITAL ORDER, PERFORMED IN ~~LOC~~ HOSPITAL LAB): SARS Coronavirus 2: NEGATIVE

## 2020-03-30 MED ORDER — PREDNISONE 20 MG PO TABS
40.0000 mg | ORAL_TABLET | Freq: Every day | ORAL | 0 refills | Status: AC
Start: 2020-03-31 — End: 2020-04-04

## 2020-03-30 MED ORDER — ALBUTEROL SULFATE 0.63 MG/3ML IN NEBU: 1.0000 | INHALATION_SOLUTION | Freq: Four times a day (QID) | RESPIRATORY_TRACT | 0 refills | Status: DC | PRN

## 2020-03-30 MED ORDER — METHYLPREDNISOLONE SODIUM SUCC 125 MG IJ SOLR
125.0000 mg | Freq: Once | INTRAMUSCULAR | Status: AC
  Administered 2020-03-30: 125 mg via INTRAVENOUS
  Filled 2020-03-30: qty 2

## 2020-03-30 MED ORDER — ALBUTEROL SULFATE HFA 108 (90 BASE) MCG/ACT IN AERS
4.0000 | INHALATION_SPRAY | Freq: Once | RESPIRATORY_TRACT | Status: AC
  Administered 2020-03-30: 4 via RESPIRATORY_TRACT
  Filled 2020-03-30: qty 6.7

## 2020-03-30 MED ORDER — SODIUM CHLORIDE 0.9 % IV BOLUS
1000.0000 mL | Freq: Once | INTRAVENOUS | Status: AC
  Administered 2020-03-30: 1000 mL via INTRAVENOUS

## 2020-03-30 MED ORDER — ALBUTEROL SULFATE HFA 108 (90 BASE) MCG/ACT IN AERS: 1.0000 | INHALATION_SPRAY | Freq: Four times a day (QID) | RESPIRATORY_TRACT | 0 refills | Status: DC | PRN

## 2020-03-30 NOTE — ED Triage Notes (Signed)
Pt with hx asthma, sob since yesterday.  Pt does not have a doctor and has ran out of his albuterol inhaler.

## 2020-03-30 NOTE — Discharge Instructions (Addendum)
You were seen in the emergency department today with an asthma exacerbation.  I have refilled your asthma medications and will have you take steroid over the next 4 days starting tomorrow.  Your x-ray also showed a small lung nodule.  The radiologist is recommending a nonemergency CT scan of your chest.  Please mention this to your primary care doctor and they can complete the scan as an outpatient.   Substance Abuse Treatment Programs  Intensive Outpatient Programs [facility]     [address]      [place], [place]                   [phone]       [facility] [address] [place] [address] [place], [place] [phone]  [facility]     (Inpatient and outpatient)     [address]           [phone]    [facility] [phone] (Suboxone and Methadone)  [address]      [place], [place] [address]      [phone]       [address] [place], [place] [phone]  [facility] (Outpatient/Inpatient, [facility])    (insurance only) [phone]             [facility] (Groups & Residential) [place], [place] [phone]     [facility]     [address]     [place], [place]      [phone]       [facility] (for caregivers and family) [phone] [address] [place], [place] [phone]      Residential Treatment Programs [facility]      [address], [place], [place] [address]  [phone]       [facility] [address] [place], [place] [address] [phone]  [facility] [place]        [phone]       [facility] [phone]  [facility]             [address]                                         [place], [place]                                                [phone] or [phone]                               [facility] [place] [address] [place],  [address] [phone]  [facility]    [address]      [place], [place]     [phone]       [facility] [address] [place], [place] [phone]  [facility]   [address] [place], [place] [address]     [phone]      Admissions: 8am-3pm M-F  [facility] ([facility]) [address] [place], [place] [phone]  [facility]: Residential Program [phone] Days)   [place], [place]  [phone] or [phone]     [facility]: [facility] [place], [place] (Walk in Hours over the weekend or by referral)  [facility] [address] [place], [place], [place] [address] [phone]  Crisis Mobile: [facility]:  [phone] (for crisis response 24 hours a day) [facility] Hotline:      [phone] Outpatient Psychiatry and Counseling  [facility]: Mobile Crisis Management 24 hours:  [phone]  [facility] of the [employer] sliding scale fee and walk in schedule: M-F 8am-12pm/1pm-3pm [address]  [place], [place] [address] [phone]  [facility] [address] [place], [place] [address] [phone]  [facility] (Former[facility] [employer])- new patient walk-in appointments available Monday - Friday 8am -3pm.          [address] [place], [place] [address] [phone] or crisis line- [phone]  [facility] Outpatient Services/ Intensive Outpatient Therapy Program [address] [place], [place] [address] [phone]  [facility]                  Crisis Services      [phone] [address]     [place], [place] [address]                 [facility]   [facility] [phone]. [address] [place], [place] [address]   [facility]          [address] [place]   [place], [place] [address]       [phone]  [facility] [address] [place], [place] [address] [phone]  [facility]    [address]    [place], [place] [address]     [phone]       Andee Poles, MD     [address]     [place] [address]     [phone]       [facility] [address] [place] [address]  [facility]     [address] [place], [place]      [phone]       [facility] Eulogio Ditch, MD [address] [place], [place] [address] [phone]  [facility]     [address]     [place], [place] [address]     [phone]       [facility] [address] [place], [place] [address] [phone] Resources for Temporary Residential Assistance/Crisis Centers  [facility]) M-F 8am-3pm   [address] [place], [place] [address]   [phone] Services include: laundry, barbering, support groups, case management, phone  & computer access, showers, AA/NA mtgs, mental health/substance abuse nurse, job skills class, disability information, VA assistance, spiritual classes, etc.   HOMELESS SHELTERS  [facility]     [facility]   [address], [place]     [phone]              [employer] (women and children)       [address] [place], [place] [address] [phone] [email or web address] for application and process Application Required  [facility]   [address]    [place] [address]  [phone]                    [facility] [place] [address] [place], [place] [address] [phone] application appt.) Application Required  [facility] (women only)    [address]     [place], [place]  [address]     [phone]      Intake starts 6pm daily Need valid ID, SSC, & Police report [employer] [address] [place], [place] [phone] Application Required  [employer] (men only)     [address]      [place], [place]     [phone]       [facility] [place] (Pregnant women only) [address] [place], [place] [phone]  [facility]      [address]      [place], [place] [address]     [phone]             [facility] [address] [place], [place] [phone] 90 day commitment/SA/Application process  [facility](men only)     [address]     [place], [place]     [phone]       Check-in at [facility] [address] [place], [place] [address] [phone] Men/Women/Women and Children must be there by 7 pm  [facility] [place], [place] [phone]
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
## 2020-04-13 ENCOUNTER — Observation Stay (HOSPITAL_COMMUNITY)
Admission: EM | Admit: 2020-04-13 | Discharge: 2020-04-13 | Discharge status: Against Medical Advice | Payer: Self-pay | Attending: Internal Medicine | Admitting: Internal Medicine

## 2020-04-13 ENCOUNTER — Encounter (HOSPITAL_COMMUNITY): Payer: Self-pay

## 2020-04-13 ENCOUNTER — Other Ambulatory Visit: Payer: Self-pay

## 2020-04-13 DIAGNOSIS — J4551 Severe persistent asthma with (acute) exacerbation: Principal | ICD-10-CM | POA: Insufficient documentation

## 2020-04-13 DIAGNOSIS — Z87820 Personal history of traumatic brain injury: Secondary | ICD-10-CM | POA: Insufficient documentation

## 2020-04-13 DIAGNOSIS — D72829 Elevated white blood cell count, unspecified: Secondary | ICD-10-CM | POA: Insufficient documentation

## 2020-04-13 DIAGNOSIS — Z79899 Other long term (current) drug therapy: Secondary | ICD-10-CM | POA: Insufficient documentation

## 2020-04-13 DIAGNOSIS — Z20822 Contact with and (suspected) exposure to covid-19: Secondary | ICD-10-CM | POA: Insufficient documentation

## 2020-04-13 DIAGNOSIS — J45901 Unspecified asthma with (acute) exacerbation: Secondary | ICD-10-CM | POA: Diagnosis present

## 2020-04-13 LAB — CBC WITH DIFFERENTIAL/PLATELET
Abs Immature Granulocytes: 0.07 10*3/uL (ref 0.00–0.07)
Basophils Absolute: 0 10*3/uL (ref 0.0–0.1)
Basophils Relative: 0 %
Eosinophils Absolute: 0.2 10*3/uL (ref 0.0–0.5)
Eosinophils Relative: 2 %
HCT: 43.7 % (ref 39.0–52.0)
Hemoglobin: 14.9 g/dL (ref 13.0–17.0)
Immature Granulocytes: 1 %
Lymphocytes Relative: 6 %
Lymphs Abs: 0.9 10*3/uL (ref 0.7–4.0)
MCH: 30.1 pg (ref 26.0–34.0)
MCHC: 34.1 g/dL (ref 30.0–36.0)
MCV: 88.3 fL (ref 80.0–100.0)
Monocytes Absolute: 0.4 10*3/uL (ref 0.1–1.0)
Monocytes Relative: 3 %
Neutro Abs: 12.2 10*3/uL — ABNORMAL HIGH (ref 1.7–7.7)
Neutrophils Relative %: 88 %
Platelets: 275 10*3/uL (ref 150–400)
RBC: 4.95 MIL/uL (ref 4.22–5.81)
RDW: 12.3 % (ref 11.5–15.5)
WBC: 13.8 10*3/uL — ABNORMAL HIGH (ref 4.0–10.5)
nRBC: 0 % (ref 0.0–0.2)

## 2020-04-13 LAB — COMPREHENSIVE METABOLIC PANEL
ALT: 15 U/L (ref 0–44)
AST: 19 U/L (ref 15–41)
Albumin: 3.8 g/dL (ref 3.5–5.0)
Alkaline Phosphatase: 67 U/L (ref 38–126)
Anion gap: 10 (ref 5–15)
BUN: 13 mg/dL (ref 6–20)
CO2: 23 mmol/L (ref 22–32)
Calcium: 8.3 mg/dL — ABNORMAL LOW (ref 8.9–10.3)
Chloride: 105 mmol/L (ref 98–111)
Creatinine, Ser: 1.01 mg/dL (ref 0.61–1.24)
GFR calc Af Amer: >60 mL/min (ref >60)
GFR calc non Af Amer: >60 mL/min (ref >60)
Glucose, Bld: 120 mg/dL — ABNORMAL HIGH (ref 70–99)
Potassium: 3.9 mmol/L (ref 3.5–5.1)
Sodium: 138 mmol/L (ref 135–145)
Total Bilirubin: 0.1 mg/dL — ABNORMAL LOW (ref 0.3–1.2)
Total Protein: 6.2 g/dL — ABNORMAL LOW (ref 6.5–8.1)

## 2020-04-13 LAB — TROPONIN I (HIGH SENSITIVITY)
Troponin I (High Sensitivity): 4 ng/L (ref <18)
Troponin I (High Sensitivity): 3 ng/L (ref <18)

## 2020-04-13 LAB — MAGNESIUM: Magnesium: 2.5 mg/dL — ABNORMAL HIGH (ref 1.7–2.4)

## 2020-04-13 LAB — LIPASE, BLOOD: Lipase: 17 U/L (ref 11–51)

## 2020-04-13 LAB — SARS CORONAVIRUS 2 BY RT PCR (HOSPITAL ORDER, PERFORMED IN ~~LOC~~ HOSPITAL LAB): SARS Coronavirus 2: NEGATIVE

## 2020-04-13 LAB — PHOSPHORUS: Phosphorus: 2.8 mg/dL (ref 2.5–4.6)

## 2020-04-13 MED ORDER — ONDANSETRON HCL 4 MG PO TABS: 4.0000 mg | ORAL_TABLET | Freq: Four times a day (QID) | ORAL | Status: DC | PRN

## 2020-04-13 MED ORDER — METHYLPREDNISOLONE SODIUM SUCC 125 MG IJ SOLR
125.0000 mg | Freq: Once | INTRAMUSCULAR | Status: AC
  Administered 2020-04-13: 125 mg via INTRAVENOUS
  Filled 2020-04-13: qty 2

## 2020-04-13 MED ORDER — MAGNESIUM SULFATE 2 GM/50ML IV SOLN
2.0000 g | Freq: Once | INTRAVENOUS | Status: AC
  Administered 2020-04-13: 2 g via INTRAVENOUS
  Filled 2020-04-13: qty 50

## 2020-04-13 MED ORDER — POTASSIUM CHLORIDE IN NACL 20-0.9 MEQ/L-% IV SOLN: INTRAVENOUS | Status: DC

## 2020-04-13 MED ORDER — SODIUM CHLORIDE 0.9 % IV BOLUS
1000.0000 mL | Freq: Once | INTRAVENOUS | Status: AC
  Administered 2020-04-13: 1000 mL via INTRAVENOUS

## 2020-04-13 MED ORDER — ONDANSETRON HCL 4 MG/2ML IJ SOLN: 4.0000 mg | Freq: Four times a day (QID) | INTRAMUSCULAR | Status: DC | PRN

## 2020-04-13 MED ORDER — ALBUTEROL (5 MG/ML) CONTINUOUS INHALATION SOLN
10.0000 mg/h | INHALATION_SOLUTION | Freq: Once | RESPIRATORY_TRACT | Status: AC
  Administered 2020-04-13: 10 mg/h via RESPIRATORY_TRACT
  Filled 2020-04-13: qty 20

## 2020-04-13 MED ORDER — BUDESONIDE 0.25 MG/2ML IN SUSP
0.2500 mg | Freq: Two times a day (BID) | RESPIRATORY_TRACT | Status: DC
  Administered 2020-04-13: 0.25 mg via RESPIRATORY_TRACT
  Filled 2020-04-13: qty 2

## 2020-04-13 MED ORDER — ENOXAPARIN SODIUM 40 MG/0.4ML ~~LOC~~ SOLN
40.0000 mg | SUBCUTANEOUS | Status: DC
  Filled 2020-04-13: qty 0.4

## 2020-04-13 MED ORDER — ACETAMINOPHEN 325 MG PO TABS: 650.0000 mg | ORAL_TABLET | Freq: Four times a day (QID) | ORAL | Status: DC | PRN

## 2020-04-13 MED ORDER — ACETAMINOPHEN 650 MG RE SUPP: 650.0000 mg | Freq: Four times a day (QID) | RECTAL | Status: DC | PRN

## 2020-04-13 MED ORDER — ALBUTEROL SULFATE HFA 108 (90 BASE) MCG/ACT IN AERS
8.0000 | INHALATION_SPRAY | Freq: Once | RESPIRATORY_TRACT | Status: AC
  Administered 2020-04-13: 8 via RESPIRATORY_TRACT
  Filled 2020-04-13: qty 6.7

## 2020-04-13 MED ORDER — IPRATROPIUM-ALBUTEROL 0.5-2.5 (3) MG/3ML IN SOLN: 3.0000 mL | RESPIRATORY_TRACT | Status: DC | PRN

## 2020-04-13 MED ORDER — ONDANSETRON HCL 4 MG/2ML IJ SOLN
4.0000 mg | Freq: Once | INTRAMUSCULAR | Status: AC
  Administered 2020-04-13: 4 mg via INTRAVENOUS
  Filled 2020-04-13: qty 2
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
## 2020-04-13 NOTE — ED Notes (Signed)
ED Provider at bedside. 

## 2020-04-13 NOTE — ED Triage Notes (Signed)
Pt woke up from sleep this morning having difficulty breathing. Pt called EMS. Prior to EMS arrival, Pt administered 2 breathing treatments to self (5mg  Albuterol). When EMS arrived, they report giving Pt 5mg  more of Albuterol in 2 breathing treatments. During Triage, Pt remains 95% on room air, but does present with wheezing in all lung fields.
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
## 2020-04-13 NOTE — ED Notes (Signed)
Pt reported " I have animals at home and no one to take care of them. I can't stay." Hospitalist notified.

## 2020-04-13 NOTE — Progress Notes (Signed)
Patient finished CAT, BS now Rhonchi/Expiratory wheeze.  Before treatment patient had inspiratory and expiratory wheeze.  Possible admit per MD, will continue to monitor.

## 2020-04-15 ENCOUNTER — Encounter (HOSPITAL_COMMUNITY): Payer: Self-pay | Admitting: Emergency Medicine

## 2020-04-15 ENCOUNTER — Emergency Department (HOSPITAL_COMMUNITY)
Admission: EM | Admit: 2020-04-15 | Discharge: 2020-04-19 | Disposition: A | Discharge status: Home / Self Care | Payer: Self-pay | Attending: Emergency Medicine | Admitting: Emergency Medicine

## 2020-04-15 ENCOUNTER — Other Ambulatory Visit: Payer: Self-pay

## 2020-04-15 DIAGNOSIS — R0602 Shortness of breath: Secondary | ICD-10-CM | POA: Insufficient documentation

## 2020-04-15 DIAGNOSIS — R4689 Other symptoms and signs involving appearance and behavior: Secondary | ICD-10-CM | POA: Diagnosis present

## 2020-04-15 DIAGNOSIS — F10920 Alcohol use, unspecified with intoxication, uncomplicated: Secondary | ICD-10-CM | POA: Insufficient documentation

## 2020-04-15 DIAGNOSIS — R05 Cough: Secondary | ICD-10-CM | POA: Insufficient documentation

## 2020-04-15 DIAGNOSIS — F419 Anxiety disorder, unspecified: Secondary | ICD-10-CM | POA: Insufficient documentation

## 2020-04-15 DIAGNOSIS — J45901 Unspecified asthma with (acute) exacerbation: Secondary | ICD-10-CM | POA: Insufficient documentation

## 2020-04-15 DIAGNOSIS — Z046 Encounter for general psychiatric examination, requested by authority: Secondary | ICD-10-CM | POA: Insufficient documentation

## 2020-04-15 DIAGNOSIS — F322 Major depressive disorder, single episode, severe without psychotic features: Secondary | ICD-10-CM

## 2020-04-15 DIAGNOSIS — F1290 Cannabis use, unspecified, uncomplicated: Secondary | ICD-10-CM | POA: Insufficient documentation

## 2020-04-15 DIAGNOSIS — F69 Unspecified disorder of adult personality and behavior: Secondary | ICD-10-CM | POA: Insufficient documentation

## 2020-04-15 DIAGNOSIS — G479 Sleep disorder, unspecified: Secondary | ICD-10-CM | POA: Insufficient documentation

## 2020-04-15 DIAGNOSIS — Z87891 Personal history of nicotine dependence: Secondary | ICD-10-CM | POA: Insufficient documentation

## 2020-04-15 DIAGNOSIS — R451 Restlessness and agitation: Secondary | ICD-10-CM | POA: Insufficient documentation

## 2020-04-15 LAB — COMPREHENSIVE METABOLIC PANEL
ALT: 27 U/L (ref 0–44)
AST: 39 U/L (ref 15–41)
Albumin: 4.3 g/dL (ref 3.5–5.0)
Alkaline Phosphatase: 69 U/L (ref 38–126)
Anion gap: 13 (ref 5–15)
BUN: 14 mg/dL (ref 6–20)
CO2: 21 mmol/L — ABNORMAL LOW (ref 22–32)
Calcium: 9.1 mg/dL (ref 8.9–10.3)
Chloride: 104 mmol/L (ref 98–111)
Creatinine, Ser: 0.82 mg/dL (ref 0.61–1.24)
GFR calc Af Amer: >60 mL/min (ref >60)
GFR calc non Af Amer: >60 mL/min (ref >60)
Glucose, Bld: 90 mg/dL (ref 70–99)
Potassium: 3.9 mmol/L (ref 3.5–5.1)
Sodium: 138 mmol/L (ref 135–145)
Total Bilirubin: 0.4 mg/dL (ref 0.3–1.2)
Total Protein: 7.4 g/dL (ref 6.5–8.1)

## 2020-04-15 LAB — CBC WITH DIFFERENTIAL/PLATELET
Abs Immature Granulocytes: 0.07 10*3/uL (ref 0.00–0.07)
Basophils Absolute: 0.1 10*3/uL (ref 0.0–0.1)
Basophils Relative: 1 %
Eosinophils Absolute: 0.2 10*3/uL (ref 0.0–0.5)
Eosinophils Relative: 2 %
HCT: 45 % (ref 39.0–52.0)
Hemoglobin: 15.2 g/dL (ref 13.0–17.0)
Immature Granulocytes: 1 %
Lymphocytes Relative: 21 %
Lymphs Abs: 2.6 10*3/uL (ref 0.7–4.0)
MCH: 30.3 pg (ref 26.0–34.0)
MCHC: 33.8 g/dL (ref 30.0–36.0)
MCV: 89.6 fL (ref 80.0–100.0)
Monocytes Absolute: 0.8 10*3/uL (ref 0.1–1.0)
Monocytes Relative: 6 %
Neutro Abs: 8.8 10*3/uL — ABNORMAL HIGH (ref 1.7–7.7)
Neutrophils Relative %: 69 %
Platelets: 312 10*3/uL (ref 150–400)
RBC: 5.02 MIL/uL (ref 4.22–5.81)
RDW: 12.6 % (ref 11.5–15.5)
WBC: 12.6 10*3/uL — ABNORMAL HIGH (ref 4.0–10.5)
nRBC: 0 % (ref 0.0–0.2)

## 2020-04-15 LAB — RAPID URINE DRUG SCREEN, HOSP PERFORMED
Amphetamines: NOT DETECTED
Barbiturates: NOT DETECTED
Benzodiazepines: NOT DETECTED
Cocaine: NOT DETECTED
Opiates: NOT DETECTED
Tetrahydrocannabinol: POSITIVE — AB

## 2020-04-15 LAB — SALICYLATE LEVEL: Salicylate Lvl: <7 mg/dL — ABNORMAL LOW (ref 7.0–30.0)

## 2020-04-15 LAB — VALPROIC ACID LEVEL: Valproic Acid Lvl: <10 ug/mL — ABNORMAL LOW (ref 50.0–100.0)

## 2020-04-15 LAB — ETHANOL: Alcohol, Ethyl (B): 110 mg/dL — ABNORMAL HIGH (ref <10)

## 2020-04-15 LAB — ACETAMINOPHEN LEVEL: Acetaminophen (Tylenol), Serum: <10 ug/mL — ABNORMAL LOW (ref 10–30)

## 2020-04-15 MED ORDER — QUETIAPINE FUMARATE 25 MG PO TABS
50.0000 mg | ORAL_TABLET | Freq: Every day | ORAL | Status: DC
  Administered 2020-04-15 – 2020-04-18 (×4): 50 mg via ORAL
  Filled 2020-04-15 (×4): qty 2

## 2020-04-15 MED ORDER — ALBUTEROL SULFATE (2.5 MG/3ML) 0.083% IN NEBU
5.0000 mg | INHALATION_SOLUTION | Freq: Once | RESPIRATORY_TRACT | Status: AC
  Administered 2020-04-15: 5 mg via RESPIRATORY_TRACT
  Filled 2020-04-15: qty 6

## 2020-04-15 MED ORDER — PREDNISONE 50 MG PO TABS
60.0000 mg | ORAL_TABLET | Freq: Every day | ORAL | Status: DC
  Administered 2020-04-15 – 2020-04-19 (×4): 60 mg via ORAL
  Filled 2020-04-15 (×5): qty 1

## 2020-04-15 MED ORDER — ALBUTEROL SULFATE HFA 108 (90 BASE) MCG/ACT IN AERS
1.0000 | INHALATION_SPRAY | Freq: Four times a day (QID) | RESPIRATORY_TRACT | Status: DC | PRN
  Administered 2020-04-15 (×2): 2 via RESPIRATORY_TRACT
  Filled 2020-04-15: qty 6.7

## 2020-04-15 MED ORDER — IPRATROPIUM BROMIDE 0.02 % IN SOLN
0.5000 mg | Freq: Once | RESPIRATORY_TRACT | Status: AC
  Administered 2020-04-15: 0.5 mg via RESPIRATORY_TRACT
  Filled 2020-04-15: qty 2.5

## 2020-04-15 MED ORDER — ALBUTEROL SULFATE HFA 108 (90 BASE) MCG/ACT IN AERS
INHALATION_SPRAY | RESPIRATORY_TRACT | Status: AC
  Filled 2020-04-15: qty 6.7

## 2020-04-15 MED ORDER — DIVALPROEX SODIUM 250 MG PO DR TAB
500.0000 mg | DELAYED_RELEASE_TABLET | Freq: Two times a day (BID) | ORAL | Status: DC
  Administered 2020-04-15 – 2020-04-19 (×7): 500 mg via ORAL
  Filled 2020-04-15 (×9): qty 2
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
## 2020-04-15 NOTE — ED Notes (Signed)
Notified patient that we needed to do a COVID swab for a possible breathing treatment. Pt refused and stated "You're not swabbing my nose. I'll fight everyone in here before you swab me. If I die, it's yall's responsibility."

## 2020-04-15 NOTE — ED Provider Notes (Signed)
°  Physical Exam  BP (!) 125/100 (BP Location: Right Arm)    Pulse 92    Temp 98.1 F (36.7 C) (Oral)    Resp 20    Ht 5\' 11"  (1.803 m)    Wt 74.8 kg    SpO2 97%    BMI 23.01 kg/m   Physical Exam  ED Course/Procedures     Procedures  MDM  Called to see patient shortness of breath.  Wheezing.  Recently left AMA for asthma.  Here for psychiatric issues now.  Has negative Covid test 2 days ago.  Will give breathing treatment       , MD 04/15/20 [phone]

## 2020-04-15 NOTE — ED Notes (Signed)
Pt stated, "I am not eating or drinking until I go home."

## 2020-04-15 NOTE — ED Notes (Signed)
Pt. States their "blood sugar is dropping". Pt. Does not have a history of diabetes. Pt. Is refusing to eat their lunch. Pt. Keeps stating they are "going to leave".
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
## 2020-04-15 NOTE — Progress Notes (Signed)
Pt wanded by security, changed into paper scrubs, belonging locked in psych locker.

## 2020-04-15 NOTE — ED Notes (Signed)
Have notified respiratory.  
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
## 2020-04-16 NOTE — ED Notes (Signed)
Pt refused to allow staff to update vitals. When asked if there was a reason he didn't want [place] to take them  He said to me " hipocratic oath what

## 2020-04-16 NOTE — ED Notes (Signed)
Pt. Became agitated due to the increasing noises coming from the rooms. Pt. Stated that " If you think they are crazy I can even more crazy." Pt. Requested a room. Notified pt. To bear with [place] we are working to resolve the situation. Pt. Yelled " It wasn't a request, it wasn't request." Notified pt. That they were getting a room. Moved pt. To new room. Pt. Calmed down after getting new room.
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
## 2020-04-16 NOTE — ED Notes (Signed)
Pt stated to me that he had not spoken to a psychiatrist, and eval not really helpful as its the same questions " How are you feeling, are you having thoughts of harm." stated " they dont really even know why I did what I did" , " I just reached a breaking point" Pt also wishes to speak with the doctor

## 2020-04-16 NOTE — ED Notes (Addendum)
CNA made aware of vitals due.

## 2020-04-16 NOTE — ED Notes (Signed)
Patient is getting agitated with no answer about getting discharged. Patient stsates he will be suing the hospital upon discharge because no one is taking care of his animals and he claims it is the fault of the hospital.

## 2020-04-16 NOTE — ED Notes (Signed)
Pt on phone with mother. 

## 2020-04-16 NOTE — ED Notes (Signed)
Patient awaken long enough for vitals to be obtained.

## 2020-04-17 NOTE — ED Notes (Signed)
[phone]  Pt's Mother's Number

## 2020-04-17 NOTE — ED Notes (Addendum)
I have ask the pt 2 times can I check Vitals Pt refused Vitals to be check.  Pt said no one will be checking his Vitals

## 2020-04-17 NOTE — ED Notes (Addendum)
Attempted to calm down pt Pt is yelling profanity Pt yelling out that he is ready to see a Dr.
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
## 2020-04-17 NOTE — ED Notes (Signed)
Pt was given a gingerale.

## 2020-04-17 NOTE — ED Notes (Addendum)
Pt put plate of food in floor pt said he does not each chicken sandwich's. Ask could another tray be ordered

## 2020-04-17 NOTE — ED Notes (Signed)
Pt is on phone

## 2020-04-17 NOTE — ED Notes (Signed)
Pt refusing medication because he said he hasn't seen a psychiatrist.

## 2020-04-17 NOTE — ED Notes (Addendum)
Pt said he will be leaving soon if he is not seen.  I ask pt could I check his vital signs, pt said no one is checking his vitals  until he is seen by a Dr

## 2020-04-17 NOTE — ED Notes (Signed)
When attempting to get vitals [name] Hall stated "I'll let you have my blood pressure when I can speak with the Dr." I was unable to obtain vitals

## 2020-04-17 NOTE — ED Notes (Signed)
Have not heard any update from [facility]. Have notified patient.

## 2020-04-17 NOTE — ED Notes (Signed)
Pt provided phone to speak with his mother. During conversation Pt began yelling. This RN asked Pt to please calm down and refrain from yelling in the department and disrupting other Pts. Pt acknowledges this request and ended the phone call with his mother.

## 2020-04-17 NOTE — ED Notes (Signed)
This nurse had to call [facility] again to get more information. Have notified patient that [facility] does not have any beds and they are recommending inpatient placement. He is very upset and wants to get home to his animals. He said "he is going to go home to dead animals." I apologized that I couldn't give him anymore information

## 2020-04-17 NOTE — ED Notes (Addendum)
Ask the pt again can I check Vitals pt said "NO" he would like to see the Dr

## 2020-04-17 NOTE — ED Notes (Addendum)
Pt brought a breakfast tray pt said he will not eat until he talks to a DR Food tray on bedside table.

## 2020-04-17 NOTE — ED Notes (Addendum)
Have called [facility] to try to get information on patient's status. They stated they would give me a call back. There is no note that has been put in for today. Pt would like to know what his status is. Will await phone call.
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
## 2020-04-17 NOTE — ED Notes (Addendum)
Pt did eat his food tray.

## 2020-04-17 NOTE — ED Notes (Signed)
Went to pt's room, pt eating lunch at this time, pt states that he doesn't need anything from me at this time, states that he would like to see the doctor

## 2020-04-17 NOTE — ED Notes (Addendum)
Pt said "that if his animals are not alive when he gets home he will come back and it will not end well"
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
## 2020-04-18 NOTE — Progress Notes (Signed)
Patient meets criteria for inpatient treatment. No appropriate or available beds at [facility]. CSW faxed referrals to the following facilities for review:  [facility]   [facility]   [facility]   [facility]   [facility]   [facility]   [facility]   [facility]   [facility]   [facility]   [facility]   [facility]   [facility]   [facility]   [facility]   [facility]   [facility]   [facility]    TTS will continue to seek bed placement.  Vilma Meckel. Algis Greenhouse, MSW, LCSW Clinical Social Work/Disposition Phone: [phone] Fax: [phone]

## 2020-04-18 NOTE — ED Notes (Signed)
Per pt  "If you read my chart you'd know what is going on" "I hear and see things, but it is from my head - like ghosts cause they come and go but its' been a long time. I haven't had that in a long time?"   Pt is defensive and a bit irritated Lying with his face covered, "because I'm not wearing a mask"  Meal provided

## 2020-04-18 NOTE — Progress Notes (Addendum)
CSW notified that [facility] declined patient due to his substance use.   [facility] has no bed availability.  [facility] is at capacity.   TTS will continue to seek placement.   Vilma Meckel. Algis Greenhouse, MSW, LCSW Clinical Social Work/Disposition Phone: [phone] Fax: [phone]

## 2020-04-18 NOTE — ED Notes (Signed)
Meal provided 

## 2020-04-19 DIAGNOSIS — R4689 Other symptoms and signs involving appearance and behavior: Secondary | ICD-10-CM

## 2020-04-19 LAB — VALPROIC ACID LEVEL: Valproic Acid Lvl: 49 ug/mL — ABNORMAL LOW (ref 50.0–100.0)

## 2020-04-19 MED ORDER — DIVALPROEX SODIUM 500 MG PO DR TAB: 500.0000 mg | DELAYED_RELEASE_TABLET | Freq: Two times a day (BID) | ORAL | 1 refills | Status: DC

## 2020-04-19 NOTE — Discharge Instructions (Addendum)
Follow-up as discussed.  

## 2020-04-19 NOTE — ED Notes (Addendum)
Reola Calkins, [facility] NP, requesting Valproic Acid level on pt before administering Depakote.

## 2020-04-19 NOTE — ED Notes (Signed)
Asked EDP for discharge x 2

## 2020-04-19 NOTE — ED Notes (Signed)
Resended IVC paperwork faxed to the magistrate.
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
## 2020-04-19 NOTE — ED Provider Notes (Signed)
  Physical Exam  BP 96/62 (BP Location: Left Arm)   Pulse 60   Temp 98.5 F (36.9 C) (Oral)   Resp 17   Ht 5\' 11"  (1.803 m)   Wt 74.8 kg   SpO2 97%   BMI 23.01 kg/m   Physical Exam  ED Course/Procedures     Procedures  MDM  Patient is been seen by psychiatry and cleared for discharge.  They state he can go home as long as he gives away his guns.  Is going to turn over guns to police.  Will discharge home with prescription for his Depakote.  Outpatient follow-up as discussed.       , MD 04/19/20 1309

## 2020-04-19 NOTE — ED Notes (Signed)
Per Reola Calkins, [facility] NP, pt is recommend outpatient treatment. Must be transferred home via law enforcement to remove weapons from home.   Reports pt "feels better on Depakote. Needs prescription."

## 2020-04-19 NOTE — ED Notes (Signed)
[facility] [employer] called to send [employer] or [employer] to transport pt home at this time.

## 2020-08-26 ENCOUNTER — Encounter: Payer: Self-pay | Attending: Physical Medicine & Rehabilitation | Admitting: Physical Medicine & Rehabilitation

## 2020-11-07 ENCOUNTER — Other Ambulatory Visit: Payer: Self-pay

## 2021-01-27 ENCOUNTER — Other Ambulatory Visit: Payer: Self-pay

## 2021-01-27 ENCOUNTER — Encounter (HOSPITAL_BASED_OUTPATIENT_CLINIC_OR_DEPARTMENT_OTHER): Payer: Self-pay

## 2021-01-27 ENCOUNTER — Observation Stay (HOSPITAL_BASED_OUTPATIENT_CLINIC_OR_DEPARTMENT_OTHER)
Admission: EM | Admit: 2021-01-27 | Discharge: 2021-01-28 | Disposition: A | Discharge status: Home / Self Care | Payer: BLUE CROSS/BLUE SHIELD | Attending: Internal Medicine | Admitting: Internal Medicine

## 2021-01-27 ENCOUNTER — Emergency Department (HOSPITAL_BASED_OUTPATIENT_CLINIC_OR_DEPARTMENT_OTHER): Payer: BLUE CROSS/BLUE SHIELD

## 2021-01-27 DIAGNOSIS — S069XAA Unspecified intracranial injury with loss of consciousness status unknown, initial encounter: Secondary | ICD-10-CM | POA: Diagnosis present

## 2021-01-27 DIAGNOSIS — F32A Depression, unspecified: Secondary | ICD-10-CM | POA: Diagnosis not present

## 2021-01-27 DIAGNOSIS — J45901 Unspecified asthma with (acute) exacerbation: Secondary | ICD-10-CM | POA: Diagnosis not present

## 2021-01-27 DIAGNOSIS — Z79899 Other long term (current) drug therapy: Secondary | ICD-10-CM | POA: Diagnosis not present

## 2021-01-27 DIAGNOSIS — Z87820 Personal history of traumatic brain injury: Secondary | ICD-10-CM | POA: Insufficient documentation

## 2021-01-27 DIAGNOSIS — Z20822 Contact with and (suspected) exposure to covid-19: Secondary | ICD-10-CM | POA: Diagnosis not present

## 2021-01-27 DIAGNOSIS — R0602 Shortness of breath: Secondary | ICD-10-CM | POA: Diagnosis present

## 2021-01-27 DIAGNOSIS — F17290 Nicotine dependence, other tobacco product, uncomplicated: Secondary | ICD-10-CM | POA: Insufficient documentation

## 2021-01-27 DIAGNOSIS — J4541 Moderate persistent asthma with (acute) exacerbation: Secondary | ICD-10-CM | POA: Diagnosis not present

## 2021-01-27 DIAGNOSIS — Z8669 Personal history of other diseases of the nervous system and sense organs: Secondary | ICD-10-CM | POA: Insufficient documentation

## 2021-01-27 DIAGNOSIS — S069X9A Unspecified intracranial injury with loss of consciousness of unspecified duration, initial encounter: Secondary | ICD-10-CM | POA: Diagnosis present

## 2021-01-27 DIAGNOSIS — S069X9S Unspecified intracranial injury with loss of consciousness of unspecified duration, sequela: Secondary | ICD-10-CM

## 2021-01-27 LAB — CBC WITH DIFFERENTIAL/PLATELET
Abs Immature Granulocytes: 0.02 10*3/uL (ref 0.00–0.07)
Basophils Absolute: 0.1 10*3/uL (ref 0.0–0.1)
Basophils Relative: 1 %
Eosinophils Absolute: 0.4 10*3/uL (ref 0.0–0.5)
Eosinophils Relative: 6 %
HCT: 42.5 % (ref 39.0–52.0)
Hemoglobin: 15.2 g/dL (ref 13.0–17.0)
Immature Granulocytes: 0 %
Lymphocytes Relative: 30 %
Lymphs Abs: 2 10*3/uL (ref 0.7–4.0)
MCH: 30.4 pg (ref 26.0–34.0)
MCHC: 35.8 g/dL (ref 30.0–36.0)
MCV: 85 fL (ref 80.0–100.0)
Monocytes Absolute: 0.5 10*3/uL (ref 0.1–1.0)
Monocytes Relative: 7 %
Neutro Abs: 3.7 10*3/uL (ref 1.7–7.7)
Neutrophils Relative %: 56 %
Platelets: 293 10*3/uL (ref 150–400)
RBC: 5 MIL/uL (ref 4.22–5.81)
RDW: 12.1 % (ref 11.5–15.5)
WBC: 6.5 10*3/uL (ref 4.0–10.5)
nRBC: 0 % (ref 0.0–0.2)

## 2021-01-27 LAB — COMPREHENSIVE METABOLIC PANEL
ALT: 18 U/L (ref 0–44)
AST: 20 U/L (ref 15–41)
Albumin: 3.9 g/dL (ref 3.5–5.0)
Alkaline Phosphatase: 57 U/L (ref 38–126)
Anion gap: 9 (ref 5–15)
BUN: 15 mg/dL (ref 6–20)
CO2: 21 mmol/L — ABNORMAL LOW (ref 22–32)
Calcium: 9.1 mg/dL (ref 8.9–10.3)
Chloride: 105 mmol/L (ref 98–111)
Creatinine, Ser: 0.8 mg/dL (ref 0.61–1.24)
GFR, Estimated: >60 mL/min (ref >60)
Glucose, Bld: 97 mg/dL (ref 70–99)
Potassium: 3.6 mmol/L (ref 3.5–5.1)
Sodium: 135 mmol/L (ref 135–145)
Total Bilirubin: <0.1 mg/dL — ABNORMAL LOW (ref 0.3–1.2)
Total Protein: 6.5 g/dL (ref 6.5–8.1)

## 2021-01-27 LAB — RESP PANEL BY RT-PCR (FLU A&B, COVID) ARPGX2
Influenza A by PCR: NEGATIVE
Influenza B by PCR: NEGATIVE
SARS Coronavirus 2 by RT PCR: NEGATIVE

## 2021-01-27 MED ORDER — METHYLPREDNISOLONE SODIUM SUCC 125 MG IJ SOLR
125.0000 mg | Freq: Once | INTRAMUSCULAR | Status: AC
  Administered 2021-01-27: 125 mg via INTRAVENOUS
  Filled 2021-01-27: qty 2

## 2021-01-27 MED ORDER — PREDNISONE 20 MG PO TABS
40.0000 mg | ORAL_TABLET | Freq: Every day | ORAL | Status: DC
  Administered 2021-01-28: 40 mg via ORAL
  Filled 2021-01-27: qty 2

## 2021-01-27 MED ORDER — ALBUTEROL SULFATE HFA 108 (90 BASE) MCG/ACT IN AERS
8.0000 | INHALATION_SPRAY | Freq: Once | RESPIRATORY_TRACT | Status: AC
  Administered 2021-01-27: 8 via RESPIRATORY_TRACT
  Filled 2021-01-27: qty 6.7

## 2021-01-27 MED ORDER — MAGNESIUM SULFATE 2 GM/50ML IV SOLN
2.0000 g | Freq: Once | INTRAVENOUS | Status: AC
  Administered 2021-01-27: 2 g via INTRAVENOUS
  Filled 2021-01-27: qty 50

## 2021-01-27 MED ORDER — SODIUM CHLORIDE 0.9 % IV SOLN
INTRAVENOUS | Status: DC | PRN
  Administered 2021-01-27: 250 mL via INTRAVENOUS

## 2021-01-27 MED ORDER — ENOXAPARIN SODIUM 40 MG/0.4ML ~~LOC~~ SOLN
40.0000 mg | SUBCUTANEOUS | Status: DC
  Administered 2021-01-27: 40 mg via SUBCUTANEOUS
  Filled 2021-01-27: qty 0.4

## 2021-01-27 MED ORDER — IPRATROPIUM BROMIDE HFA 17 MCG/ACT IN AERS
4.0000 | INHALATION_SPRAY | Freq: Once | RESPIRATORY_TRACT | Status: AC
  Administered 2021-01-27: 4 via RESPIRATORY_TRACT
  Filled 2021-01-27: qty 12.9

## 2021-01-27 MED ORDER — ALBUTEROL SULFATE HFA 108 (90 BASE) MCG/ACT IN AERS
4.0000 | INHALATION_SPRAY | RESPIRATORY_TRACT | Status: DC | PRN
  Administered 2021-01-27: 4 via RESPIRATORY_TRACT

## 2021-01-27 MED ORDER — IPRATROPIUM-ALBUTEROL 0.5-2.5 (3) MG/3ML IN SOLN
3.0000 mL | Freq: Four times a day (QID) | RESPIRATORY_TRACT | Status: DC
  Administered 2021-01-28: 3 mL via RESPIRATORY_TRACT
  Filled 2021-01-27: qty 3

## 2021-01-27 NOTE — ED Triage Notes (Signed)
Pt c/o SOB x 2 days-denies cough/fever/flu sx-RT with pt-pt taken to tx area via w/c
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
## 2021-01-27 NOTE — ED Notes (Signed)
[facility] at bedside to transfer patient to [facility]. Mother at bedside. Patient A&O x4. No acute distress noted.

## 2021-01-28 DIAGNOSIS — J45901 Unspecified asthma with (acute) exacerbation: Secondary | ICD-10-CM | POA: Diagnosis not present

## 2021-01-28 DIAGNOSIS — F32A Depression, unspecified: Secondary | ICD-10-CM | POA: Diagnosis not present

## 2021-01-28 LAB — BASIC METABOLIC PANEL
Anion gap: 12 (ref 5–15)
BUN: 14 mg/dL (ref 6–20)
CO2: 20 mmol/L — ABNORMAL LOW (ref 22–32)
Calcium: 9.7 mg/dL (ref 8.9–10.3)
Chloride: 105 mmol/L (ref 98–111)
Creatinine, Ser: 0.8 mg/dL (ref 0.61–1.24)
GFR, Estimated: >60 mL/min (ref >60)
Glucose, Bld: 165 mg/dL — ABNORMAL HIGH (ref 70–99)
Potassium: 3.9 mmol/L (ref 3.5–5.1)
Sodium: 137 mmol/L (ref 135–145)

## 2021-01-28 LAB — CBC
HCT: 43.1 % (ref 39.0–52.0)
Hemoglobin: 14.9 g/dL (ref 13.0–17.0)
MCH: 29.8 pg (ref 26.0–34.0)
MCHC: 34.6 g/dL (ref 30.0–36.0)
MCV: 86.2 fL (ref 80.0–100.0)
Platelets: 302 10*3/uL (ref 150–400)
RBC: 5 MIL/uL (ref 4.22–5.81)
RDW: 12.1 % (ref 11.5–15.5)
WBC: 9.7 10*3/uL (ref 4.0–10.5)
nRBC: 0 % (ref 0.0–0.2)

## 2021-01-28 LAB — HIV ANTIBODY (ROUTINE TESTING W REFLEX): HIV Screen 4th Generation wRfx: NONREACTIVE

## 2021-01-28 MED ORDER — ALBUTEROL SULFATE HFA 108 (90 BASE) MCG/ACT IN AERS: 2.0000 | INHALATION_SPRAY | Freq: Four times a day (QID) | RESPIRATORY_TRACT | 2 refills | Status: DC | PRN

## 2021-01-28 MED ORDER — ALBUTEROL SULFATE HFA 108 (90 BASE) MCG/ACT IN AERS: 2.0000 | INHALATION_SPRAY | Freq: Four times a day (QID) | RESPIRATORY_TRACT | 2 refills | Status: AC | PRN

## 2021-01-28 MED ORDER — PREDNISONE 20 MG PO TABS: 40.0000 mg | ORAL_TABLET | Freq: Every day | ORAL | 0 refills | Status: DC

## 2021-01-28 MED ORDER — ALBUTEROL SULFATE HFA 108 (90 BASE) MCG/ACT IN AERS
2.0000 | INHALATION_SPRAY | RESPIRATORY_TRACT | Status: DC
  Administered 2021-01-28: 2 via RESPIRATORY_TRACT
  Filled 2021-01-28: qty 6.7

## 2021-01-28 NOTE — Plan of Care (Signed)

## 2021-01-28 NOTE — Discharge Instructions (Signed)
[name] Hall was admitted to the Hospital on 01/27/2021 and Discharged on Discharge Date 01/28/2021 and should be excused from work/school   for 2   days starting 01/27/2021 , may return to work/school without any restrictions.  Call Lambert Keto MD, [facility] [phone] with questions.  Marinda Elk M.D on 01/28/2021,at 7:09 AM  [facility] Office  [phone]
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
## 2021-01-28 NOTE — TOC Transition Note (Signed)
Transition of Care [facility]) - CM/SW Discharge Note  Patient Details  Name: [name] Hall MRN: [id number] Date of Birth: Jul 01, 1986  [facility]) CM/SW Contact:  Sherie Don, LCSW Phone Number: 01/28/2021, 10:10 AM  Clinical Narrative: Patient is a 35 year old male who is under observation for asthma exacerbation. TOC received consult for medication, however, patient has insurance. CSW met with patient and his mother to discuss needs. Per patient, he recently became insured so he was not able to afford his albuterol before, but he does not have a PCP to provide refills. CSW recommended patient call the number on his insurance card to find a PCP in-network. TOC signing off.  Final next level of care: Home/Self Care Barriers to Discharge: No Barriers Identified  Patient Goals and CMS Choice Choice offered to / list presented to : NA  Discharge Plan and Services        DME Arranged: N/A DME Agency: NA  Readmission Risk Interventions No flowsheet data found.

## 2022-07-05 ENCOUNTER — Emergency Department (HOSPITAL_BASED_OUTPATIENT_CLINIC_OR_DEPARTMENT_OTHER)
Admission: EM | Admit: 2022-07-05 | Discharge: 2022-07-05 | Disposition: A | Discharge status: Home / Self Care | Payer: Self-pay | Attending: Emergency Medicine | Admitting: Emergency Medicine

## 2022-07-05 ENCOUNTER — Encounter (HOSPITAL_BASED_OUTPATIENT_CLINIC_OR_DEPARTMENT_OTHER): Payer: Self-pay | Admitting: *Deleted

## 2022-07-05 ENCOUNTER — Emergency Department (HOSPITAL_BASED_OUTPATIENT_CLINIC_OR_DEPARTMENT_OTHER): Payer: Self-pay | Admitting: Radiology

## 2022-07-05 ENCOUNTER — Other Ambulatory Visit: Payer: Self-pay

## 2022-07-05 DIAGNOSIS — T148XXA Other injury of unspecified body region, initial encounter: Secondary | ICD-10-CM

## 2022-07-05 DIAGNOSIS — S61512A Laceration without foreign body of left wrist, initial encounter: Secondary | ICD-10-CM

## 2022-07-05 DIAGNOSIS — W268XXA Contact with other sharp object(s), not elsewhere classified, initial encounter: Secondary | ICD-10-CM | POA: Insufficient documentation

## 2022-07-05 DIAGNOSIS — S61522A Laceration with foreign body of left wrist, initial encounter: Secondary | ICD-10-CM | POA: Insufficient documentation

## 2022-07-05 MED ORDER — LIDOCAINE HCL 2 % IJ SOLN
20.0000 mL | Freq: Once | INTRAMUSCULAR | Status: AC
  Administered 2022-07-05: 400 mg

## 2022-07-05 MED ORDER — LIDOCAINE-EPINEPHRINE 2 %-1:100000 IJ SOLN: 20.0000 mL | Freq: Once | INTRAMUSCULAR | Status: DC

## 2022-07-05 NOTE — ED Provider Notes (Signed)
[facility] EMERGENCY DEPT Provider Note   CSN: [id number] Arrival date & time: 07/05/22  1057     History  Chief Complaint  Patient presents with   Laceration    [name] is a 36 y.o. malewith no pertinent past medical history. Presents today for evaluation of metal that is lodged in his left wrist. Patient was working on his truck this morning and broke a piece of metal off of the truck when he hit it with a tool. The piece of metal punctured his left wrist right above the dorsal aspect of his left hand on the ulnar side. Blood loss was minimal, patient states "barely enough to fill the bottom of a cup". Patient rinsed the wound with water, covered with gauze and wrapped. Minimal pain, has not taken any medication this morning. Denies any loss of feeling, numbness, tingling, or pain in distal fingers, hand or forearm. Able to move wrist and fingers normally without pain but can feel metal upon movement. Denies syncope. No prior injuries or surgeries to left wrist. TDAP is UTD  Laceration      Home Medications Prior to Admission medications   Medication Sig Start Date End Date Taking? Authorizing Provider  albuterol (VENTOLIN HFA) 108 (90 Base) MCG/ACT inhaler Inhale 2 puffs into the lungs every 6 (six) hours as needed for wheezing or shortness of breath. 01/28/21   Marinda Elk, MD  predniSONE (DELTASONE) 20 MG tablet Take 2 tablets (40 mg total) by mouth daily with breakfast. 01/28/21   Marinda Elk, MD      Allergies    Cat hair extract and Other    Review of Systems   Review of Systems  Physical Exam Updated Vital Signs BP 125/84 (BP Location: Right Arm)   Pulse (!) 56   Temp (!) 97.5 F (36.4 C)   Resp 14   SpO2 98%  Physical Exam Vitals and nursing note reviewed.  Constitutional:      General: He is not in acute distress.    Appearance: He is well-developed. He is not diaphoretic.  HENT:     Head: Normocephalic and atraumatic.   Eyes:     General: No scleral icterus.    Conjunctiva/sclera: Conjunctivae normal.  Cardiovascular:     Rate and Rhythm: Normal rate and regular rhythm.     Heart sounds: Normal heart sounds.  Pulmonary:     Effort: Pulmonary effort is normal. No respiratory distress.     Breath sounds: Normal breath sounds.  Abdominal:     Palpations: Abdomen is soft.     Tenderness: There is no abdominal tenderness.  Musculoskeletal:     Cervical back: Normal range of motion and neck supple.     Comments: Left wrist with 1 cm laceration to the lateral dorsal surface, full range of motion of the fingers and wrist, normal sensation.  There is no active bleeding.  The metal foreign body is visible in the small laceration.  Skin:    General: Skin is warm and dry.  Neurological:     Mental Status: He is alert.  Psychiatric:        Behavior: Behavior normal.     ED Results / Procedures / Treatments   Labs (all labs ordered are listed, but only abnormal results are displayed) Labs Reviewed - No data to display  EKG None  Radiology DG Wrist Complete Left  Result Date: 07/05/2022 CLINICAL DATA:  Laceration, metal stuck in wound EXAM: LEFT WRIST -  COMPLETE 3+ VIEW COMPARISON:  None Available. FINDINGS: There is no evidence of fracture or dislocation. There is no evidence of arthropathy or other focal bone abnormality. Angular metallic foreign body in the soft tissues of the medial wrist adjacent to the hamate, measuring 0.8 cm in projection. IMPRESSION: 1. No fracture or dislocation of the left wrist. Carpus is normally aligned 2. Angular metallic foreign body in the soft tissues of the medial wrist adjacent overlying the hamate, measuring 0.8 cm in projection. Electronically Signed   By: Jearld Lesch M.D.   On: 07/05/2022 11:42    Procedures .Foreign Body Removal  Date/Time: 07/05/2022 1:53 PM  Performed by: Arthor Captain, PA-C Authorized by: Arthor Captain, PA-C  Consent: Verbal consent  obtained. Risks and benefits: risks, benefits and alternatives were discussed Consent given by: patient Patient identity confirmed: verbally with patient Body area: skin Anesthesia: local infiltration  Anesthesia: Local Anesthetic: lidocaine 2% without epinephrine Anesthetic total: 3 mL Localization method: visualized Removal mechanism: forceps Tendon involvement: superficial Complexity: simple 1 objects recovered. Objects recovered: metal piece Post-procedure assessment: foreign body removed Patient tolerance: patient tolerated the procedure well with no immediate complications  .Marland KitchenLaceration Repair  Date/Time: 07/05/2022 1:54 PM  Performed by: Arthor Captain, PA-C Authorized by: Arthor Captain, PA-C   Consent:    Consent obtained:  Verbal   Consent given by:  Patient   Risks discussed:  Infection, need for additional repair, pain, poor cosmetic result and poor wound healing   Alternatives discussed:  No treatment and delayed treatment Universal protocol:    Procedure explained and questions answered to patient or proxy's satisfaction: yes     Relevant documents present and verified: yes     Test results available: yes     Imaging studies available: yes     Required blood products, implants, devices, and special equipment available: yes     Site/side marked: yes     Immediately prior to procedure, a time out was called: yes     Patient identity confirmed:  Verbally with patient Anesthesia:    Anesthesia method:  Local infiltration   Local anesthetic:  Lidocaine 2% w/o epi Laceration details:    Location: Left wrist.   Length (cm):  1   Depth (mm):  0.5 Pre-procedure details:    Preparation:  Patient was prepped and draped in usual sterile fashion Exploration:    Limited defect created (wound extended): no     Imaging obtained: x-ray     Imaging outcome: foreign body noted     Wound exploration: wound explored through full range of motion and entire depth of wound  visualized     Wound extent: foreign bodies/material     Wound extent: no fascia violation noted, no muscle damage noted, no nerve damage noted, no tendon damage noted, no underlying fracture noted and no vascular damage noted     Foreign bodies/material:  1 portion of a ball bearing   Contaminated: no   Treatment:    Area cleansed with:  Povidone-iodine   Amount of cleaning:  Standard   Irrigation solution:  Sterile saline   Irrigation method:  Pressure wash   Visualized foreign bodies/material removed: yes   Skin repair:    Repair method:  Sutures   Suture size:  4-0   Suture material:  Prolene   Suture technique:  Simple interrupted   Number of sutures:  1 Approximation:    Approximation:  Close Repair type:    Repair type:  Simple Post-procedure details:  Dressing:  Sterile dressing   Procedure completion:  Tolerated well, no immediate complications     Medications Ordered in ED Medications  lidocaine (XYLOCAINE) 2 % (with pres) injection 400 mg (400 mg Infiltration Given by Other 07/05/22 1229)    ED Course/ Medical Decision Making/ A&P Clinical Course as of 07/05/22 1359  Tue Jul 05, 2022  1148 DG Wrist Complete Left [AH]    Clinical Course User Index [AH] Arthor Captain, PA-C                           Medical Decision Making Patient with retained foreign body in the skin of left wrist.  We successfully removed this from the wrist.  1 suture applied and hemostasis achieved.  He is up-to-date on his tetanus vaccination.  No evidence of tendon vascular or nerve injury.  Dressing applied, wound care and suture removal instructions given.  Appears appropriate for discharge at this time  Amount and/or Complexity of Data Reviewed Radiology: ordered and independent interpretation performed. Decision-making details documented in ED Course.  Risk Prescription drug management.           Final Clinical Impression(s) / ED Diagnoses Final diagnoses:  Foreign  body in skin  Wrist laceration, left, initial encounter    Rx / DC Orders ED Discharge Orders     None         Arthor Captain, PA-C 07/05/22 1359    Ernie Avena, MD 07/05/22 1448
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
## 2022-07-05 NOTE — ED Notes (Signed)
Bandaged by Provider.

## 2022-07-05 NOTE — ED Notes (Signed)
Discharge paperwork given and verbally understood. 

## 2022-07-05 NOTE — ED Notes (Addendum)
Working on vehicle, a piece from the vehicle was lodged into his left wrist. Bleeding controlled. I did not visuzle the injury, already visualized by the Triage RN and then they rewrapped the injury. Pt stated that there maybe a small piece lodged in the arm.

## 2022-07-05 NOTE — Discharge Instructions (Signed)

## 2023-05-02 ENCOUNTER — Emergency Department (HOSPITAL_COMMUNITY)
Admission: EM | Admit: 2023-05-02 | Discharge: 2023-05-02 | Disposition: A | Discharge status: Home / Self Care | Payer: Self-pay | Attending: Emergency Medicine | Admitting: Emergency Medicine

## 2023-05-02 ENCOUNTER — Encounter (HOSPITAL_COMMUNITY): Payer: Self-pay

## 2023-05-02 DIAGNOSIS — R2231 Localized swelling, mass and lump, right upper limb: Secondary | ICD-10-CM | POA: Diagnosis not present

## 2023-05-02 DIAGNOSIS — T63441A Toxic effect of venom of bees, accidental (unintentional), initial encounter: Secondary | ICD-10-CM | POA: Diagnosis not present

## 2023-05-02 DIAGNOSIS — T7840XA Allergy, unspecified, initial encounter: Secondary | ICD-10-CM | POA: Diagnosis present

## 2023-05-02 DIAGNOSIS — R062 Wheezing: Secondary | ICD-10-CM | POA: Diagnosis not present

## 2023-05-02 DIAGNOSIS — M7989 Other specified soft tissue disorders: Secondary | ICD-10-CM

## 2023-05-02 MED ORDER — PREDNISONE 50 MG PO TABS: 50.0000 mg | ORAL_TABLET | Freq: Every day | ORAL | 0 refills | Status: AC

## 2023-05-02 MED ORDER — PREDNISONE 20 MG PO TABS
60.0000 mg | ORAL_TABLET | Freq: Once | ORAL | Status: AC
  Administered 2023-05-02: 60 mg via ORAL
  Filled 2023-05-02: qty 3

## 2023-05-02 MED ORDER — EPINEPHRINE 0.3 MG/0.3ML IJ SOAJ: 0.3000 mg | INTRAMUSCULAR | 0 refills | Status: AC | PRN

## 2023-05-02 MED ORDER — DIPHENHYDRAMINE HCL 25 MG PO CAPS
50.0000 mg | ORAL_CAPSULE | Freq: Once | ORAL | Status: AC
  Administered 2023-05-02: 50 mg via ORAL
  Filled 2023-05-02: qty 2

## 2023-05-02 MED ORDER — ALBUTEROL SULFATE HFA 108 (90 BASE) MCG/ACT IN AERS
2.0000 | INHALATION_SPRAY | Freq: Once | RESPIRATORY_TRACT | Status: AC
  Administered 2023-05-02: 2 via RESPIRATORY_TRACT
  Filled 2023-05-02: qty 6.7

## 2023-05-02 MED ORDER — FAMOTIDINE 20 MG PO TABS
20.0000 mg | ORAL_TABLET | Freq: Once | ORAL | Status: AC
  Administered 2023-05-02: 20 mg via ORAL
  Filled 2023-05-02: qty 1

## 2023-05-02 NOTE — ED Triage Notes (Signed)
Pt arrived via POV, c/o yellow jacket sting to right hand just prior to arrival, swelling present. Pt speaking in full sentences, airway patent, 02 wnl
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
## 2023-05-02 NOTE — Discharge Instructions (Signed)
Your history, exam, and evaluation today are consistent with allergic reaction to the sting you received on your right hand this morning.  Your symptoms are mainly more local with the swelling discomfort and redness however you did have some wheezing worse than your baseline.  We gave you steroids orally and oral antihistamines and symptoms seem to have stabilized.  We also treated your wheezing with albuterol.  Please use the inhaler if you continue to have wheezing and use a burst of steroids for the next 5 days starting tomorrow.  You may use over-the-counter antihistamines if you have further itching or rash.  Please fill the prescription for EpiPen in case symptoms were to change or worsen and please rest and stay hydrated.  If any symptoms change or worsen acutely, please return to the nearest emergency department.

## 2024-04-28 ENCOUNTER — Emergency Department (HOSPITAL_COMMUNITY)

## 2024-04-28 ENCOUNTER — Encounter (HOSPITAL_COMMUNITY): Payer: Self-pay

## 2024-04-28 ENCOUNTER — Emergency Department (HOSPITAL_COMMUNITY)
Admission: EM | Admit: 2024-04-28 | Discharge: 2024-04-28 | Disposition: A | Discharge status: Home / Self Care | Attending: Emergency Medicine | Admitting: Emergency Medicine

## 2024-04-28 ENCOUNTER — Other Ambulatory Visit: Payer: Self-pay

## 2024-04-28 DIAGNOSIS — S42021A Displaced fracture of shaft of right clavicle, initial encounter for closed fracture: Secondary | ICD-10-CM | POA: Diagnosis not present

## 2024-04-28 DIAGNOSIS — S40211A Abrasion of right shoulder, initial encounter: Secondary | ICD-10-CM | POA: Diagnosis not present

## 2024-04-28 DIAGNOSIS — M25561 Pain in right knee: Secondary | ICD-10-CM | POA: Diagnosis not present

## 2024-04-28 DIAGNOSIS — Y92410 Unspecified street and highway as the place of occurrence of the external cause: Secondary | ICD-10-CM | POA: Insufficient documentation

## 2024-04-28 DIAGNOSIS — Z23 Encounter for immunization: Secondary | ICD-10-CM | POA: Insufficient documentation

## 2024-04-28 DIAGNOSIS — S52501A Unspecified fracture of the lower end of right radius, initial encounter for closed fracture: Secondary | ICD-10-CM | POA: Diagnosis not present

## 2024-04-28 DIAGNOSIS — R000 Tachycardia, unspecified: Secondary | ICD-10-CM | POA: Insufficient documentation

## 2024-04-28 DIAGNOSIS — M25562 Pain in left knee: Secondary | ICD-10-CM | POA: Diagnosis not present

## 2024-04-28 DIAGNOSIS — M25511 Pain in right shoulder: Secondary | ICD-10-CM | POA: Diagnosis present

## 2024-04-28 LAB — I-STAT CHEM 8, ED
BUN: 15 mg/dL (ref 6–20)
Calcium, Ion: 1.17 mmol/L (ref 1.15–1.40)
Chloride: 108 mmol/L (ref 98–111)
Creatinine, Ser: 1.5 mg/dL — ABNORMAL HIGH (ref 0.61–1.24)
Glucose, Bld: 89 mg/dL (ref 70–99)
HCT: 42 % (ref 39.0–52.0)
Hemoglobin: 14.3 g/dL (ref 13.0–17.0)
Potassium: 3.8 mmol/L (ref 3.5–5.1)
Sodium: 143 mmol/L (ref 135–145)
TCO2: 22 mmol/L (ref 22–32)

## 2024-04-28 LAB — CBC
HCT: 43.8 % (ref 39.0–52.0)
Hemoglobin: 14.8 g/dL (ref 13.0–17.0)
MCH: 29.7 pg (ref 26.0–34.0)
MCHC: 33.8 g/dL (ref 30.0–36.0)
MCV: 87.8 fL (ref 80.0–100.0)
Platelets: 248 K/uL (ref 150–400)
RBC: 4.99 MIL/uL (ref 4.22–5.81)
RDW: 12.6 % (ref 11.5–15.5)
WBC: 11.6 K/uL — ABNORMAL HIGH (ref 4.0–10.5)
nRBC: 0 % (ref 0.0–0.2)

## 2024-04-28 LAB — COMPREHENSIVE METABOLIC PANEL WITH GFR
ALT: 60 U/L — ABNORMAL HIGH (ref 0–44)
AST: 38 U/L (ref 15–41)
Albumin: 4.2 g/dL (ref 3.5–5.0)
Alkaline Phosphatase: 60 U/L (ref 38–126)
Anion gap: 13 (ref 5–15)
BUN: 16 mg/dL (ref 6–20)
CO2: 20 mmol/L — ABNORMAL LOW (ref 22–32)
Calcium: 8.9 mg/dL (ref 8.9–10.3)
Chloride: 106 mmol/L (ref 98–111)
Creatinine, Ser: 1.15 mg/dL (ref 0.61–1.24)
GFR, Estimated: >60 mL/min (ref >60)
Glucose, Bld: 96 mg/dL (ref 70–99)
Potassium: 3.6 mmol/L (ref 3.5–5.1)
Sodium: 139 mmol/L (ref 135–145)
Total Bilirubin: 0.6 mg/dL (ref 0.0–1.2)
Total Protein: 7 g/dL (ref 6.5–8.1)

## 2024-04-28 LAB — URINALYSIS, ROUTINE W REFLEX MICROSCOPIC
Bilirubin Urine: NEGATIVE
Glucose, UA: NEGATIVE mg/dL
Hgb urine dipstick: NEGATIVE
Ketones, ur: NEGATIVE mg/dL
Leukocytes,Ua: NEGATIVE
Nitrite: NEGATIVE
Protein, ur: NEGATIVE mg/dL
Specific Gravity, Urine: 1.011 (ref 1.005–1.030)
pH: 5 (ref 5.0–8.0)

## 2024-04-28 LAB — PROTIME-INR
INR: 1 (ref 0.8–1.2)
Prothrombin Time: 13.6 s (ref 11.4–15.2)

## 2024-04-28 LAB — I-STAT CG4 LACTIC ACID, ED
Lactic Acid, Venous: 1.3 mmol/L (ref 0.5–1.9)
Lactic Acid, Venous: 1.8 mmol/L (ref 0.5–1.9)

## 2024-04-28 LAB — SAMPLE TO BLOOD BANK

## 2024-04-28 MED ORDER — OXYCODONE-ACETAMINOPHEN 5-325 MG PO TABS: 1.0000 | ORAL_TABLET | Freq: Four times a day (QID) | ORAL | 0 refills | Status: DC | PRN

## 2024-04-28 MED ORDER — HYDROMORPHONE HCL 1 MG/ML IJ SOLN
0.5000 mg | Freq: Once | INTRAMUSCULAR | Status: AC
  Administered 2024-04-28: 0.5 mg via INTRAVENOUS
  Filled 2024-04-28: qty 1

## 2024-04-28 MED ORDER — IOHEXOL 300 MG/ML  SOLN
100.0000 mL | Freq: Once | INTRAMUSCULAR | Status: AC | PRN
  Administered 2024-04-28: 100 mL via INTRAVENOUS

## 2024-04-28 MED ORDER — IPRATROPIUM-ALBUTEROL 0.5-2.5 (3) MG/3ML IN SOLN
3.0000 mL | Freq: Once | RESPIRATORY_TRACT | Status: AC
  Administered 2024-04-28: 3 mL via RESPIRATORY_TRACT
  Filled 2024-04-28: qty 3

## 2024-04-28 MED ORDER — HYDROMORPHONE HCL 1 MG/ML IJ SOLN
1.0000 mg | Freq: Once | INTRAMUSCULAR | Status: AC
  Administered 2024-04-28: 1 mg via INTRAVENOUS
  Filled 2024-04-28: qty 1

## 2024-04-28 MED ORDER — ONDANSETRON HCL 4 MG/2ML IJ SOLN
4.0000 mg | Freq: Once | INTRAMUSCULAR | Status: AC
  Administered 2024-04-28: 4 mg via INTRAVENOUS
  Filled 2024-04-28: qty 2

## 2024-04-28 MED ORDER — TETANUS-DIPHTH-ACELL PERTUSSIS 5-2.5-18.5 LF-MCG/0.5 IM SUSY
0.5000 mL | PREFILLED_SYRINGE | Freq: Once | INTRAMUSCULAR | Status: AC
  Administered 2024-04-28: 0.5 mL via INTRAMUSCULAR
  Filled 2024-04-28: qty 0.5

## 2024-04-28 NOTE — Progress Notes (Signed)
 Orthopedic Tech Progress Note Patient Details:  JYAIR KIRALY 04-06-1986 [id number]  Ortho Devices Type of Ortho Device: Arm sling, Sugartong splint Ortho Device/Splint Location: rue Ortho Device/Splint Interventions: Ordered, Application, Adjustment   Post Interventions Patient Tolerated: Well Instructions Provided: Care of device, Adjustment of device  Chandra Dorn PARAS 04/28/2024, 5:58 AM
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
## 2024-04-28 NOTE — Discharge Instructions (Signed)
 Please call the orthopedic office first thing Monday morning.  Dr. Dozier said that he would see you first thing in the morning when they open.  Please go to his office then.  Take pain medication as prescribed.  Keep the arm immobilized in the sling.  Continue to wear the splint for the wrist.  You can also talk to Dr. Thelda office about getting an appointment with a hand specialist for evaluation of your wrist injury.
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
## 2024-04-29 ENCOUNTER — Other Ambulatory Visit: Payer: Self-pay | Admitting: Orthopedic Surgery

## 2024-04-30 NOTE — Progress Notes (Signed)
 COVID Vaccine Completed:  Date of COVID positive in last 90 days:  PCP - Rosina Bullock, PA Cardiologist - n/a  Chest x-ray - 04/28/24 Epic EKG - n/a Stress Test - n/a ECHO - n/a Cardiac Cath - n/a Pacemaker/ICD device last checked: n/a Spinal Cord Stimulator: n/a  Bowel Prep - no  Sleep Study - n/a CPAP -   Fasting Blood Sugar -n/a  Checks Blood Sugar _____ times a day  Last dose of GLP1 agonist-  N/A GLP1 instructions:  Do not take after     Last dose of SGLT-2 inhibitors-  N/A SGLT-2 instructions:  Do not take after     Blood Thinner Instructions:  Last dose: n/a Time: Aspirin Instructions: Last Dose:  Activity level: Can go up a flight of stairs and perform activities of daily living without stopping and without symptoms of chest pain or shortness of breath.  Anesthesia review:   Patient denies shortness of breath, fever, cough and chest pain at PAT appointment  Patient verbalized understanding of instructions that were given to them at the PAT appointment. Patient was also instructed that they will need to review over the PAT instructions again at home before surgery.

## 2024-04-30 NOTE — Progress Notes (Signed)
 Please place order for PAT appointment scheduled 05/01/24.

## 2024-04-30 NOTE — Patient Instructions (Signed)
 SURGICAL WAITING ROOM VISITATION  Patients having surgery or a procedure may have no more than 2 support people in the waiting area - these visitors may rotate.    Children under the age of 72 must have an adult with them who is not the patient.  Visitors with respiratory illnesses are discouraged from visiting and should remain at home.  If the patient needs to stay at the hospital during part of their recovery, the visitor guidelines for inpatient rooms apply. Pre-op nurse will coordinate an appropriate time for 1 support person to accompany patient in pre-op.  This support person may not rotate.    Please refer to the [facility] website for the visitor guidelines for Inpatients (after your surgery is over and you are in a regular room).    Your procedure is scheduled on: 05/02/24   Report to [facility] Main Entrance    Report to admitting at 10:30 AM   Call this number if you have problems the morning of surgery [phone]   Do not eat food :After Midnight.   After Midnight you may have the following liquids until 9:45 AM DAY OF SURGERY  Water Non-Citrus Juices (without pulp, NO RED-Apple, White grape, White cranberry) Black Coffee (NO MILK/CREAM OR CREAMERS, sugar ok)  Clear Tea (NO MILK/CREAM OR CREAMERS, sugar ok) regular and decaf                             Plain Jell-O (NO RED)                                           Fruit ices (not with fruit pulp, NO RED)                                     Popsicles (NO RED)                                                               Sports drinks like Gatorade (NO RED)                      If you have questions, please contact your surgeon's office.   FOLLOW BOWEL PREP AND ANY ADDITIONAL PRE OP INSTRUCTIONS YOU RECEIVED FROM YOUR SURGEON'S OFFICE!!!     Oral Hygiene is also important to reduce your risk of infection.                                    Remember - BRUSH YOUR TEETH THE MORNING OF SURGERY WITH YOUR  REGULAR TOOTHPASTE  DENTURES WILL BE REMOVED PRIOR TO SURGERY PLEASE DO NOT APPLY Poly grip OR ADHESIVES!!!   Do NOT smoke after Midnight   Stop all vitamins and herbal supplements 7 days before surgery.   Take these medicines the morning of surgery with A SIP OF WATER: Inhalers, Zofran , Percocet  You may not have any metal on your body including hair pins, jewelry, and body piercing             Do not wear lotions, powders, cologne, or deodorant              Men may shave face and neck.   Do not bring valuables to the hospital. [place] IS NOT             RESPONSIBLE   FOR VALUABLES.   Contacts, glasses, dentures or bridgework may not be worn into surgery.   Bring small overnight bag day of surgery.   DO NOT BRING YOUR HOME MEDICATIONS TO THE HOSPITAL. PHARMACY WILL DISPENSE MEDICATIONS LISTED ON YOUR MEDICATION LIST TO YOU DURING YOUR ADMISSION IN THE HOSPITAL!    Patients discharged on the day of surgery will not be allowed to drive home.  Someone NEEDS to stay with you for the first 24 hours after anesthesia.              Please read over the following fact sheets you were given: IF YOU HAVE QUESTIONS ABOUT YOUR PRE-OP INSTRUCTIONS PLEASE CALL [phone]   If you received a COVID test during your pre-op visit  it is requested that you wear a mask when out in public, stay away from anyone that may not be feeling well and notify your surgeon if you develop symptoms. If you test positive for Covid or have been in contact with anyone that has tested positive in the last 10 days please notify you surgeon.    [facility] - Preparing for Surgery Before surgery, you can play an important role.  Because skin is not sterile, your skin needs to be as free of germs as possible.  You can reduce the number of germs on your skin by washing with CHG (chlorahexidine gluconate) soap before surgery.  CHG is an antiseptic cleaner which kills germs and bonds with  the skin to continue killing germs even after washing. Please DO NOT use if you have an allergy to CHG or antibacterial soaps.  If your skin becomes reddened/irritated stop using the CHG and inform your nurse when you arrive at Short Stay. Do not shave (including legs and underarms) for at least 48 hours prior to the first CHG shower.  You may shave your face/neck.  Please follow these instructions carefully:  1.  Shower with CHG Soap the night before surgery and the  morning of surgery.  2.  If you choose to wash your hair, wash your hair first as usual with your normal  shampoo.  3.  After you shampoo, rinse your hair and body thoroughly to remove the shampoo.                             4.  Use CHG as you would any other liquid soap.  You can apply chg directly to the skin and wash.  Gently with a scrungie or clean washcloth.  5.  Apply the CHG Soap to your body ONLY FROM THE NECK DOWN.   Do   not use on face/ open                           Wound or open sores. Avoid contact with eyes, ears mouth and   genitals (private parts).  Wash face,  Genitals (private parts) with your normal soap.             6.  Wash thoroughly, paying special attention to the area where your    surgery  will be performed.  7.  Thoroughly rinse your body with warm water from the neck down.  8.  DO NOT shower/wash with your normal soap after using and rinsing off the CHG Soap.                9.  Pat yourself dry with a clean towel.            10.  Wear clean pajamas.            11.  Place clean sheets on your bed the night of your first shower and do not  sleep with pets. Day of Surgery : Do not apply any lotions/deodorants the morning of surgery.  Please wear clean clothes to the hospital/surgery center.  FAILURE TO FOLLOW THESE INSTRUCTIONS MAY RESULT IN THE CANCELLATION OF YOUR SURGERY  PATIENT SIGNATURE_________________________________  NURSE  SIGNATURE__________________________________  ________________________________________________________________________

## 2024-05-01 ENCOUNTER — Encounter (HOSPITAL_COMMUNITY)
Admission: RE | Admit: 2024-05-01 | Discharge: 2024-05-01 | Disposition: A | Discharge status: Home / Self Care | Source: Ambulatory Visit | Attending: Orthopedic Surgery | Admitting: Orthopedic Surgery

## 2024-05-01 ENCOUNTER — Other Ambulatory Visit: Payer: Self-pay

## 2024-05-01 ENCOUNTER — Encounter (HOSPITAL_COMMUNITY): Payer: Self-pay

## 2024-05-01 VITALS — BP 121/85 | HR 72 | Temp 97.9°F | Resp 18 | Ht 71.0 in

## 2024-05-01 DIAGNOSIS — D649 Anemia, unspecified: Secondary | ICD-10-CM | POA: Diagnosis not present

## 2024-05-01 DIAGNOSIS — Z01818 Encounter for other preprocedural examination: Secondary | ICD-10-CM | POA: Insufficient documentation

## 2024-05-01 HISTORY — DX: Gastro-esophageal reflux disease without esophagitis: K21.9

## 2024-05-01 HISTORY — DX: Pneumonia, unspecified organism: J18.9

## 2024-05-02 ENCOUNTER — Ambulatory Visit (HOSPITAL_COMMUNITY)
Admission: RE | Admit: 2024-05-02 | Discharge: 2024-05-02 | Disposition: A | Discharge status: Home / Self Care | Attending: Orthopedic Surgery | Admitting: Orthopedic Surgery

## 2024-05-02 ENCOUNTER — Encounter (HOSPITAL_COMMUNITY)
Admission: RE | Disposition: A | Discharge status: Home / Self Care | Payer: Self-pay | Source: Home / Self Care | Attending: Orthopedic Surgery

## 2024-05-02 ENCOUNTER — Encounter (HOSPITAL_COMMUNITY): Admitting: Anesthesiology

## 2024-05-02 ENCOUNTER — Ambulatory Visit (HOSPITAL_COMMUNITY)

## 2024-05-02 ENCOUNTER — Ambulatory Visit (HOSPITAL_COMMUNITY): Admitting: Medical

## 2024-05-02 ENCOUNTER — Other Ambulatory Visit: Payer: Self-pay

## 2024-05-02 ENCOUNTER — Encounter (HOSPITAL_COMMUNITY): Payer: Self-pay | Admitting: Orthopedic Surgery

## 2024-05-02 DIAGNOSIS — G40909 Epilepsy, unspecified, not intractable, without status epilepticus: Secondary | ICD-10-CM | POA: Diagnosis not present

## 2024-05-02 DIAGNOSIS — J45909 Unspecified asthma, uncomplicated: Secondary | ICD-10-CM | POA: Diagnosis not present

## 2024-05-02 DIAGNOSIS — Z87820 Personal history of traumatic brain injury: Secondary | ICD-10-CM | POA: Insufficient documentation

## 2024-05-02 DIAGNOSIS — F32A Depression, unspecified: Secondary | ICD-10-CM | POA: Diagnosis not present

## 2024-05-02 DIAGNOSIS — S42001A Fracture of unspecified part of right clavicle, initial encounter for closed fracture: Secondary | ICD-10-CM | POA: Insufficient documentation

## 2024-05-02 DIAGNOSIS — F17290 Nicotine dependence, other tobacco product, uncomplicated: Secondary | ICD-10-CM | POA: Diagnosis not present

## 2024-05-02 HISTORY — PX: ORIF CLAVICULAR FRACTURE: SHX5055

## 2024-05-02 SURGERY — OPEN REDUCTION INTERNAL FIXATION (ORIF) CLAVICULAR FRACTURE
Anesthesia: General | Site: Shoulder | Laterality: Right

## 2024-05-02 MED ORDER — ONDANSETRON HCL 4 MG/2ML IJ SOLN
INTRAMUSCULAR | Status: DC | PRN
Start: 2024-05-02 — End: 2024-05-02
  Administered 2024-05-02: 4 mg via INTRAVENOUS

## 2024-05-02 MED ORDER — CEFAZOLIN SODIUM-DEXTROSE 2-4 GM/100ML-% IV SOLN
2.0000 g | INTRAVENOUS | Status: AC
  Administered 2024-05-02: 2 g via INTRAVENOUS
  Filled 2024-05-02: qty 100

## 2024-05-02 MED ORDER — BUPIVACAINE-EPINEPHRINE 0.25% -1:200000 IJ SOLN
INTRAMUSCULAR | Status: DC | PRN
  Administered 2024-05-02: 20 mL

## 2024-05-02 MED ORDER — FENTANYL CITRATE PF 50 MCG/ML IJ SOSY: 50.0000 ug | PREFILLED_SYRINGE | Freq: Once | INTRAMUSCULAR | Status: DC

## 2024-05-02 MED ORDER — PROPOFOL 10 MG/ML IV BOLUS
INTRAVENOUS | Status: DC | PRN
  Administered 2024-05-02: 160 mg via INTRAVENOUS

## 2024-05-02 MED ORDER — METHOCARBAMOL 1000 MG/10ML IJ SOLN
INTRAMUSCULAR | Status: AC
Start: 2024-05-02 — End: 2024-05-02
  Filled 2024-05-02: qty 10

## 2024-05-02 MED ORDER — HYDROMORPHONE HCL 1 MG/ML IJ SOLN
0.2500 mg | INTRAMUSCULAR | Status: DC | PRN
  Administered 2024-05-02 (×4): 0.5 mg via INTRAVENOUS

## 2024-05-02 MED ORDER — METHOCARBAMOL 1000 MG/10ML IJ SOLN
1000.0000 mg | Freq: Once | INTRAMUSCULAR | Status: AC
  Administered 2024-05-02: 1000 mg via INTRAVENOUS

## 2024-05-02 MED ORDER — ALBUTEROL SULFATE HFA 108 (90 BASE) MCG/ACT IN AERS
INHALATION_SPRAY | RESPIRATORY_TRACT | Status: DC | PRN
  Administered 2024-05-02: 2 via RESPIRATORY_TRACT
  Administered 2024-05-02: 4 via RESPIRATORY_TRACT

## 2024-05-02 MED ORDER — HYDROMORPHONE HCL 1 MG/ML IJ SOLN
INTRAMUSCULAR | Status: AC
  Filled 2024-05-02: qty 1

## 2024-05-02 MED ORDER — ROCURONIUM BROMIDE 100 MG/10ML IV SOLN
INTRAVENOUS | Status: DC | PRN
  Administered 2024-05-02: 60 mg via INTRAVENOUS
  Administered 2024-05-02: 10 mg via INTRAVENOUS

## 2024-05-02 MED ORDER — ONDANSETRON HCL 4 MG/2ML IJ SOLN
4.0000 mg | Freq: Once | INTRAMUSCULAR | Status: AC | PRN
  Administered 2024-05-02: 4 mg via INTRAVENOUS

## 2024-05-02 MED ORDER — ALBUTEROL SULFATE HFA 108 (90 BASE) MCG/ACT IN AERS
INHALATION_SPRAY | RESPIRATORY_TRACT | Status: AC
  Filled 2024-05-02: qty 6.7

## 2024-05-02 MED ORDER — LACTATED RINGERS IV SOLN: INTRAVENOUS | Status: DC

## 2024-05-02 MED ORDER — FENTANYL CITRATE (PF) 100 MCG/2ML IJ SOLN
INTRAMUSCULAR | Status: DC | PRN
  Administered 2024-05-02: 100 ug via INTRAVENOUS

## 2024-05-02 MED ORDER — HYDROMORPHONE HCL 1 MG/ML IJ SOLN
INTRAMUSCULAR | Status: AC
Start: 2024-05-02 — End: 2024-05-02
  Filled 2024-05-02: qty 1

## 2024-05-02 MED ORDER — MIDAZOLAM HCL 2 MG/2ML IJ SOLN
INTRAMUSCULAR | Status: AC
  Filled 2024-05-02: qty 2

## 2024-05-02 MED ORDER — ROCURONIUM BROMIDE 10 MG/ML (PF) SYRINGE
PREFILLED_SYRINGE | INTRAVENOUS | Status: AC
  Filled 2024-05-02: qty 10

## 2024-05-02 MED ORDER — KETAMINE HCL 10 MG/ML IJ SOLN
INTRAMUSCULAR | Status: DC | PRN
  Administered 2024-05-02: 10 mg via INTRAVENOUS
  Administered 2024-05-02: 25 mg via INTRAVENOUS

## 2024-05-02 MED ORDER — POVIDONE-IODINE 7.5 % EX SOLN: Freq: Once | CUTANEOUS | Status: DC

## 2024-05-02 MED ORDER — HYDROMORPHONE HCL 2 MG/ML IJ SOLN
INTRAMUSCULAR | Status: AC
  Filled 2024-05-02: qty 1

## 2024-05-02 MED ORDER — MIDAZOLAM HCL 2 MG/2ML IJ SOLN
1.0000 mg | Freq: Once | INTRAMUSCULAR | Status: AC
  Administered 2024-05-02: 2 mg via INTRAVENOUS

## 2024-05-02 MED ORDER — ONDANSETRON HCL 4 MG/2ML IJ SOLN
INTRAMUSCULAR | Status: AC
Start: 2024-05-02 — End: 2024-05-02
  Filled 2024-05-02: qty 2

## 2024-05-02 MED ORDER — BUPIVACAINE-EPINEPHRINE (PF) 0.25% -1:200000 IJ SOLN
INTRAMUSCULAR | Status: AC
  Filled 2024-05-02: qty 30

## 2024-05-02 MED ORDER — OXYCODONE HCL 5 MG PO TABS
5.0000 mg | ORAL_TABLET | Freq: Once | ORAL | Status: AC | PRN
  Administered 2024-05-02: 5 mg via ORAL

## 2024-05-02 MED ORDER — OXYCODONE HCL 5 MG PO TABS: 5.0000 mg | ORAL_TABLET | Freq: Three times a day (TID) | ORAL | 0 refills | Status: AC | PRN

## 2024-05-02 MED ORDER — KETAMINE HCL 50 MG/5ML IJ SOSY
PREFILLED_SYRINGE | INTRAMUSCULAR | Status: AC
  Filled 2024-05-02: qty 5

## 2024-05-02 MED ORDER — HYDROMORPHONE HCL 1 MG/ML IJ SOLN
INTRAMUSCULAR | Status: DC | PRN
  Administered 2024-05-02: 1 mg via INTRAVENOUS

## 2024-05-02 MED ORDER — LIDOCAINE HCL (PF) 2 % IJ SOLN
INTRAMUSCULAR | Status: AC
  Filled 2024-05-02: qty 5

## 2024-05-02 MED ORDER — CHLORHEXIDINE GLUCONATE 0.12 % MT SOLN
15.0000 mL | Freq: Once | OROMUCOSAL | Status: AC
  Administered 2024-05-02: 15 mL via OROMUCOSAL

## 2024-05-02 MED ORDER — ORAL CARE MOUTH RINSE: 15.0000 mL | Freq: Once | OROMUCOSAL | Status: AC

## 2024-05-02 MED ORDER — ALBUTEROL SULFATE (2.5 MG/3ML) 0.083% IN NEBU
2.5000 mL | INHALATION_SOLUTION | Freq: Four times a day (QID) | RESPIRATORY_TRACT | Status: DC | PRN
Start: 2024-05-02 — End: 2024-05-02

## 2024-05-02 MED ORDER — 0.9 % SODIUM CHLORIDE (POUR BTL) OPTIME
TOPICAL | Status: DC | PRN
  Administered 2024-05-02: 1000 mL

## 2024-05-02 MED ORDER — SUGAMMADEX SODIUM 200 MG/2ML IV SOLN
INTRAVENOUS | Status: DC | PRN
  Administered 2024-05-02: 200 mg via INTRAVENOUS

## 2024-05-02 MED ORDER — OXYCODONE HCL 5 MG PO TABS
ORAL_TABLET | ORAL | Status: AC
  Filled 2024-05-02: qty 1

## 2024-05-02 MED ORDER — FENTANYL CITRATE (PF) 100 MCG/2ML IJ SOLN
INTRAMUSCULAR | Status: AC
Start: 2024-05-02 — End: 2024-05-02
  Filled 2024-05-02: qty 2

## 2024-05-02 MED ORDER — LIDOCAINE HCL (CARDIAC) PF 100 MG/5ML IV SOSY
PREFILLED_SYRINGE | INTRAVENOUS | Status: DC | PRN
  Administered 2024-05-02: 80 mg via INTRAVENOUS

## 2024-05-02 MED ORDER — OXYCODONE HCL 5 MG/5ML PO SOLN: 5.0000 mg | Freq: Once | ORAL | Status: AC | PRN

## 2024-05-02 MED ORDER — DEXAMETHASONE SODIUM PHOSPHATE 10 MG/ML IJ SOLN
INTRAMUSCULAR | Status: DC | PRN
  Administered 2024-05-02: 10 mg via INTRAVENOUS

## 2024-05-02 MED ORDER — METHOCARBAMOL 500 MG PO TABS: 500.0000 mg | ORAL_TABLET | Freq: Four times a day (QID) | ORAL | 0 refills | Status: AC | PRN

## 2024-05-02 MED ORDER — METHOCARBAMOL 1000 MG/10ML IJ SOLN: 500.0000 mg | Freq: Once | INTRAMUSCULAR | Status: DC

## 2024-05-02 MED ORDER — DEXMEDETOMIDINE HCL IN NACL 80 MCG/20ML IV SOLN
INTRAVENOUS | Status: DC | PRN
  Administered 2024-05-02 (×2): 4 ug via INTRAVENOUS
  Administered 2024-05-02: 8 ug via INTRAVENOUS

## 2024-05-02 SURGICAL SUPPLY — 53 items
BAG COUNTER SPONGE SURGICOUNT (BAG) IMPLANT
BIT DRILL 2.0 LNG QUCK RELEASE (BIT) IMPLANT
BIT DRILL 2.3 QUICK RELEASE (BIT) IMPLANT
BIT DRILL 2.8 QUICK RELEASE (BIT) IMPLANT
BNDG ELASTIC 4INX 5YD STR LF (GAUZE/BANDAGES/DRESSINGS) IMPLANT
COVER SURGICAL LIGHT HANDLE (MISCELLANEOUS) ×1 IMPLANT
DRAPE C-ARM 42X120 X-RAY (DRAPES) ×1 IMPLANT
DRAPE POUCH INSTRU U-SHP 10X18 (DRAPES) IMPLANT
DRAPE SURG ORHT 6 SPLT 77X108 (DRAPES) ×2 IMPLANT
DRAPE TOP 10253 STERILE (DRAPES) IMPLANT
DRAPE U-SHAPE 47X51 STRL (DRAPES) ×1 IMPLANT
DRSG AQUACEL AG ADV 3.5X 6 (GAUZE/BANDAGES/DRESSINGS) ×1 IMPLANT
DURAPREP 26ML APPLICATOR (WOUND CARE) ×1 IMPLANT
ELECT PENCIL ROCKER SW 15FT (MISCELLANEOUS) ×1 IMPLANT
ELECT REM PT RETURN 15FT ADLT (MISCELLANEOUS) ×1 IMPLANT
GAUZE PAD ABD 8X10 STRL (GAUZE/BANDAGES/DRESSINGS) ×2 IMPLANT
GAUZE SPONGE 4X4 12PLY STRL (GAUZE/BANDAGES/DRESSINGS) ×1 IMPLANT
GLOVE BIO SURGEON STRL SZ7 (GLOVE) ×1 IMPLANT
GLOVE BIO SURGEON STRL SZ7.5 (GLOVE) ×1 IMPLANT
GLOVE BIOGEL PI IND STRL 6.5 (GLOVE) ×1 IMPLANT
GLOVE BIOGEL PI IND STRL 7.0 (GLOVE) ×1 IMPLANT
GLOVE BIOGEL PI IND STRL 8 (GLOVE) ×1 IMPLANT
GLOVE SURG POLYISO LF SZ6.5 (GLOVE) ×1 IMPLANT
GOWN STRL REUS W/ TWL LRG LVL3 (GOWN DISPOSABLE) IMPLANT
GOWN STRL REUS W/ TWL XL LVL3 (GOWN DISPOSABLE) ×2 IMPLANT
KIT BASIN OR (CUSTOM PROCEDURE TRAY) ×1 IMPLANT
KIT TURNOVER KIT A (KITS) ×1 IMPLANT
MANIFOLD NEPTUNE II (INSTRUMENTS) ×1 IMPLANT
NDL HYPO 25X1 1.5 SAFETY (NEEDLE) ×1 IMPLANT
NEEDLE HYPO 25X1 1.5 SAFETY (NEEDLE) ×1 IMPLANT
NS IRRIG 1000ML POUR BTL (IV SOLUTION) ×1 IMPLANT
PACK SHOULDER (CUSTOM PROCEDURE TRAY) ×1 IMPLANT
PAD CAST 4YDX4 CTTN HI CHSV (CAST SUPPLIES) IMPLANT
PLATE LOCKING CLAVICLE 10 HOLE (Plate) IMPLANT
SCREW HEX LOCK 3.5X20MM (Screw) IMPLANT
SCREW HEXALOBE LOCKING 3.5X14M (Screw) IMPLANT
SCREW HEXALOBE NON-LOCK 3.5X16 (Screw) IMPLANT
SCREW LOCK 12X3.5X HEXALOBE (Screw) IMPLANT
SCREW NON LOCKING 2.3X12MM (Screw) IMPLANT
SCREW NON TOGG 2.3X16MM (Screw) IMPLANT
SLING ARM FOAM STRAP LRG (SOFTGOODS) ×1 IMPLANT
SLING ARM IMMOBILIZER LRG (SOFTGOODS) IMPLANT
SLING ARM IMMOBILIZER MED (SOFTGOODS) IMPLANT
STRIP CLOSURE SKIN 1/2X4 (GAUZE/BANDAGES/DRESSINGS) ×1 IMPLANT
SUCTION TUBE FRAZIER 12FR DISP (SUCTIONS) ×1 IMPLANT
SUPPORT WRAP ARM LG (MISCELLANEOUS) ×1 IMPLANT
SUT MNCRL AB 4-0 PS2 18 (SUTURE) ×1 IMPLANT
SUT VIC AB 0 CT1 36 (SUTURE) ×2 IMPLANT
SUT VIC AB 2-0 CT1 TAPERPNT 27 (SUTURE) ×1 IMPLANT
SYR CONTROL 10ML LL (SYRINGE) ×1 IMPLANT
TOWEL OR 17X26 10 PK STRL BLUE (TOWEL DISPOSABLE) ×1 IMPLANT
WATER STERILE IRR 1000ML POUR (IV SOLUTION) ×1 IMPLANT
YANKAUER SUCT BULB TIP 10FT TU (MISCELLANEOUS) ×1 IMPLANT

## 2024-05-02 NOTE — Anesthesia Procedure Notes (Addendum)
 Procedure Name: Intubation Date/Time: 05/02/2024 11:39 AM  Performed by: Vincenzo Show, CRNAPre-anesthesia Checklist: Patient identified, Emergency Drugs available, Suction available, Patient being monitored and Timeout performed Patient Re-evaluated:Patient Re-evaluated prior to induction Oxygen  Delivery Method: Circle system utilized Preoxygenation: Pre-oxygenation with 100% oxygen  Induction Type: IV induction Ventilation: Mask ventilation without difficulty Laryngoscope Size: Mac and 3 Grade View: Grade I Tube type: Oral Tube size: 7.5 mm Number of attempts: 1 Airway Equipment and Method: Stylet Placement Confirmation: ETT inserted through vocal cords under direct vision, positive ETCO2, CO2 detector and breath sounds checked- equal and bilateral Secured at: 22 (@ teeth) cm Tube secured with: Tape Dental Injury: Teeth and Oropharynx as per pre-operative assessment  Comments: Wheezing following intubation required albuterol  administration.

## 2024-05-02 NOTE — Discharge Instructions (Addendum)
 Discharge Instructions after Open Shoulder Repair  A sling has been provided for you. Remain in your sling at all times. This includes sleeping in your sling.  Use ice on the shoulder intermittently over the first 48 hours after surgery.  Pain medicine has been prescribed for you.  Use your medicine liberally over the first 48 hours, and then you can begin to taper your use. You may take Extra Strength Tylenol  or Tylenol  only in place of the pain pills. DO NOT take ANY nonsteroidal anti-inflammatory pain medications: Advil , Motrin , Ibuprofen , Aleve, Naproxen or Naprosyn.  You may shower the day after surgery. The shoulder dressing is waterproof. Keep the dressing in place until follow up appointment. Simply allow the water to wash over the site and then pat dry. Do not rub the incisions. Make sure your axilla (armpit) is completely dry after showering. You must keep the arm splint on and keep it dry Take one aspirin, a day for 2 weeks after surgery, unless you have an aspirin sensitivity/ allergy or asthma.   Please call [phone] during normal business hours or [phone] after hours for any problems. Including the following:  - excessive redness of the incisions - drainage for more than 4 days - fever of more than 101.5 F  *Please note that pain medications will not be refilled after hours or on weekends.
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
## 2024-05-02 NOTE — Transfer of Care (Signed)
 Immediate Anesthesia Transfer of Care Note  Patient: [name] Hall  Procedure(s) Performed: OPEN REDUCTION INTERNAL FIXATION (ORIF) CLAVICULAR FRACTURE (Right: Shoulder)  Patient Location: PACU  Anesthesia Type:General  Level of Consciousness: awake and patient cooperative  Airway & Oxygen  Therapy: Patient Spontanous Breathing and Patient connected to face mask oxygen   Post-op Assessment: Report given to RN and Post -op Vital signs reviewed and stable  Post vital signs: Reviewed and stable  Last Vitals:  Vitals Value Taken Time  BP 160/85 05/02/24 13:08  Temp 37 C 05/02/24 13:08  Pulse 91 05/02/24 13:12  Resp 15 05/02/24 13:12  SpO2 92 % 05/02/24 13:12  Vitals shown include unfiled device data.  Last Pain:  Vitals:   05/02/24 1308  TempSrc:   PainSc: 0-No pain         Complications: There were no known notable events for this encounter.
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
## 2024-05-02 NOTE — Anesthesia Postprocedure Evaluation (Signed)
 Anesthesia Post Note  Patient: [name] Hall  Procedure(s) Performed: OPEN REDUCTION INTERNAL FIXATION (ORIF) CLAVICULAR FRACTURE (Right: Shoulder)     Patient location during evaluation: PACU Anesthesia Type: General Level of consciousness: awake and alert and oriented Pain management: pain level controlled Vital Signs Assessment: post-procedure vital signs reviewed and stable Respiratory status: spontaneous breathing, nonlabored ventilation and respiratory function stable Cardiovascular status: blood pressure returned to baseline and stable Postop Assessment: no apparent nausea or vomiting Anesthetic complications: no   There were no known notable events for this encounter.  Last Vitals:  Vitals:   05/02/24 1330 05/02/24 1345  BP: (!) 146/84   Pulse:  94  Resp:  19  Temp:    SpO2:  93%    Last Pain:  Vitals:   05/02/24 1330  TempSrc:   PainSc: 10-Worst pain ever                 [name]
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
## 2024-05-03 ENCOUNTER — Encounter (HOSPITAL_COMMUNITY): Payer: Self-pay | Admitting: Orthopedic Surgery
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
## 2024-10-22 ENCOUNTER — Encounter (HOSPITAL_BASED_OUTPATIENT_CLINIC_OR_DEPARTMENT_OTHER): Payer: Self-pay | Admitting: Pulmonary Disease

## 2024-10-22 ENCOUNTER — Ambulatory Visit (HOSPITAL_BASED_OUTPATIENT_CLINIC_OR_DEPARTMENT_OTHER): Admitting: Pulmonary Disease

## 2024-10-22 VITALS — BP 122/75 | HR 77 | Temp 97.5°F | Ht 71.0 in | Wt 178.1 lb

## 2024-10-22 DIAGNOSIS — F17290 Nicotine dependence, other tobacco product, uncomplicated: Secondary | ICD-10-CM

## 2024-10-22 DIAGNOSIS — J4550 Severe persistent asthma, uncomplicated: Secondary | ICD-10-CM | POA: Diagnosis not present

## 2024-10-22 DIAGNOSIS — F17200 Nicotine dependence, unspecified, uncomplicated: Secondary | ICD-10-CM

## 2024-10-22 LAB — CBC WITH DIFFERENTIAL/PLATELET
Basophils Absolute: 0.1 x10E3/uL (ref 0.0–0.2)
Basos: 1 %
EOS (ABSOLUTE): 0.2 x10E3/uL (ref 0.0–0.4)
Eos: 3 %
Hematocrit: 49.3 % (ref 37.5–51.0)
Hemoglobin: 16.5 g/dL (ref 13.0–17.7)
Immature Grans (Abs): 0 x10E3/uL (ref 0.0–0.1)
Immature Granulocytes: 0 %
Lymphocytes Absolute: 1.5 x10E3/uL (ref 0.7–3.1)
Lymphs: 18 %
MCH: 29.6 pg (ref 26.6–33.0)
MCHC: 33.5 g/dL (ref 31.5–35.7)
MCV: 88 fL (ref 79–97)
Monocytes Absolute: 0.7 x10E3/uL (ref 0.1–0.9)
Monocytes: 8 %
Neutrophils Absolute: 6.1 x10E3/uL (ref 1.4–7.0)
Neutrophils: 70 %
Platelets: 270 x10E3/uL (ref 150–450)
RBC: 5.58 x10E6/uL (ref 4.14–5.80)
RDW: 13.4 % (ref 11.6–15.4)
WBC: 8.5 x10E3/uL (ref 3.4–10.8)

## 2024-10-22 MED ORDER — ALBUTEROL SULFATE 1.25 MG/3ML IN NEBU: 1.0000 | INHALATION_SOLUTION | Freq: Four times a day (QID) | RESPIRATORY_TRACT | 12 refills | Status: AC | PRN

## 2024-10-22 MED ORDER — BUDESONIDE-FORMOTEROL FUMARATE 160-4.5 MCG/ACT IN AERO: 2.0000 | INHALATION_SPRAY | Freq: Two times a day (BID) | RESPIRATORY_TRACT | 6 refills | Status: AC

## 2024-10-22 NOTE — Assessment & Plan Note (Signed)
 Severe persistent asthma Asthma exacerbation likely due to cold air and viral infection. Recent prednisone  use was beneficial. Potential for asthma injection therapy if inhaler therapy is insufficient given elevation in AEC to 400 cells/uL in 2022. ACT is 9 indicating poor control. Will start ICS/LABA and reassess symptoms with next visit. If still poorly controlled, will consider asthma biologic. - Prescribed Symbicort, 2 puffs BID. - Instructed to rinse mouth after use. - Refilled albuterol  nebulizer. - Ordered CBCD and IgE - Follow-up in a couple of months to assess Symbicort response and consider injection therapy.

## 2024-10-22 NOTE — Patient Instructions (Signed)
" °  VISIT SUMMARY: You visited us  today due to a recent asthma exacerbation, which was likely triggered by cold air and a viral infection. We discussed your asthma management and smoking cessation efforts.  YOUR PLAN: SEVERE PERSISTENT ASTHMA: Your asthma symptoms worsened recently, likely due to cold air and a viral infection. You have a history of severe asthma and have used various treatments in the past. -Start using Symbicort, 2 puffs twice a day. Make sure to rinse your mouth after each use. -Continue using your albuterol  nebulizer as needed. -We have ordered a blood test to evaluate if you are a candidate for asthma injection therapy. -Follow up in a couple of months to see how you are responding to Symbicort and to discuss the possibility of injection therapy.  NICOTINE  DEPENDENCE: You have a long history of smoking but have recently quit. You have tried nicotine  patches before but experienced adverse effects. -Continue your efforts to quit smoking. -We discussed different nicotine  replacement therapy options that might work better for you.  Contains text generated by [employer]. "

## 2024-10-23 ENCOUNTER — Ambulatory Visit: Payer: Self-pay | Admitting: Pulmonary Disease

## 2025-01-07 ENCOUNTER — Ambulatory Visit (HOSPITAL_BASED_OUTPATIENT_CLINIC_OR_DEPARTMENT_OTHER): Admitting: Pulmonary Disease
